# Patient Record
Sex: Male | Born: 1990 | Hispanic: No | Marital: Single | Smoking: Never smoker
Health system: Southern US, Community
[De-identification: ages and names within clinical notes are randomized; demographics above are authoritative.]

## PROBLEM LIST (undated history)

## (undated) ENCOUNTER — Other Ambulatory Visit (HOSPITAL_COMMUNITY): Admission: EM | Source: Home / Self Care

## (undated) DIAGNOSIS — F419 Anxiety disorder, unspecified: Secondary | ICD-10-CM

## (undated) HISTORY — PX: APPENDECTOMY: SHX54

## (undated) HISTORY — DX: Anxiety disorder, unspecified: F41.9

## (undated) NOTE — *Deleted (*Deleted)
Adult Psychoeducational Group Not Date:  11/19/2019 Time:  0900-1045 Group Topic/Focus: PROGRESSIVE RELAXATION. A group where deep breathing is taught and tensing and relaxation muscle groups is used. Imagery is used as well.  Pts are asked to imagine 3 pillars that hold them up when they are not able to hold themselves up.  Participation Level:  Active  Participation Quality:  Appropriate  Affect:  Appropriate  Cognitive:  Oriented  Insight: Improving  Engagement in Group:  Engaged  Modes of Intervention:  Activity, Discussion, Education, and Support  Additional Comments:  ***  Melitta Tigue A 11/19/2019`  

---
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
## 2010-05-20 NOTE — Discharge Summary (Signed)
Thomas [name], Thomas [name]           ACCOUNT NO.:  [id number]   MEDICAL RECORD NO.:  [id number]          PATIENT TYPE:  OBV   LOCATION:  6123                         FACILITY:  [facility]   PHYSICIAN:  Dyann Ruddle, MDDATE OF BIRTH:  June 24, 1990   DATE OF ADMISSION:  09/13/2007  DATE OF DISCHARGE:  09/14/2007                               DISCHARGE SUMMARY   SIGNIFICANT FINDINGS:  Thomas [name] is a 20 year old male with a history of  head trauma approximately 1 week prior to admission.  He presents with  headache and dizziness since that time.  A head CT was obtained in the  emergency department at presentation and demonstrated a tiny hemorrhage  of the gray-white junction in the left frontal lobe.  There was no  associated edema or midline shift that was appreciated at that time.  He  was admitted for observation and close neurological checks.   TREATMENTS:  He was observed overnight with frequent neurologic checks.  His symptoms improved completely, and he had a nonfocal neuro exam.   OPERATIONS AND PROCEDURES:  None.   FINAL DIAGNOSIS:  Cerebral contusion.   DISCHARGE MEDICATIONS AND INSTRUCTIONS:  There are no discharge  medications.  Please avoid sports for the next week and return to the  emergency department or primary care physician for any worsening  symptoms, seizures, syncope, dizziness, persistent nausea or vomiting,  or severe headaches.   There are no pending results or issues to be followed up.   FOLLOWUP:  He will follow up on September 26, 2007 at [facility], the appointment has already been scheduled.   DISCHARGE WEIGHT:  73 kg.   DISCHARGE CONDITION:  Good.   This discharge summary is faxed to his primary care physician at  [facility] at [phone] on September 14, 2007.   REASON FOR HOSPITALIZATION:  Cerebral contusion.      Pediatrics Resident      Dyann Ruddle, MD  Electronically Signed    PR/MEDQ  D:  09/14/2007  T:  09/14/2007  Job:  [id number]

## 2010-05-23 NOTE — Op Note (Signed)
Thomas [name], Thomas [name]                       ACCOUNT NO.:  [id number]   MEDICAL RECORD NO.:  [id number]                   PATIENT TYPE:  EMS   LOCATION:  ED                                   FACILITY:  [facility]   PHYSICIAN:  Leonia Corona, M.D.               DATE OF BIRTH:  09/13/1990   DATE OF PROCEDURE:  01/26/2002  DATE OF DISCHARGE:                                 OPERATIVE REPORT   PREOPERATIVE DIAGNOSIS:  Acute appendicitis.   POSTOPERATIVE DIAGNOSIS:  Acute appendicitis.   OPERATION PERFORMED:  Open appendectomy.   SURGEON:  Leonia Corona, M.D.   ASSISTANT:  Nurse.   ANESTHESIA:  General endotracheal.   INDICATIONS FOR PROCEDURE:  This 20 year old male child was evaluated for  abdominal pain associated with nausea, vomiting and fever at [facility] emergency room.  Clinically, there was minimal  periumbilical tenderness.  The CT scan was highly suspicious for acute  appendicitis, hence the indication for the procedure.   DESCRIPTION OF PROCEDURE:  The patient was brought to the operating room,  placed supine on the operating table.  General endotracheal anesthesia was  given.  The right lower quadrant of the abdominal wall and the surrounding  area was cleaned, prepped and draped in the usual manner.  A McBurney  incision was made, centered at the McBurney point extending laterally on  either side for about 1.5 cm, making a 3 cm long incision around the skin  crease.  The incision was deepened through the subcutaneous tissue using  electrocautery until the external oblique aponeurosis was exposed.  The  external oblique was incised in the line of its fibers with a small nick  made with the knife and then extended with scissors.  The internal oblique  and transversus abdominis muscle fibers were split along its fibers with the  help of a blunt tip hemostat thrust into the muscle and then split.  [employer], water were used to spread the  fibers until the peritoneum was  visualized.  The peritoneum was held up with two hemostats and divided in  between with scissors.  The opening into the peritoneal cavity was extended  on either side with scissors.  A minimal amount of seropurulent discharge  was noted in the peritoneum just beneath the incision indicating an  inflammatory process.  The underlying cecum was followed by a Babcock  forceps.  This led to a very severely swollen distended inflamed appendix  toward the pelvic position which was bulbous and swollen at the tip  extensively covered by the omentum.  It was held up with the right index  finger and then brought out through the incision.  The cecum was mobilized  by blunt dissection with fingers and then partially was brought out through  the incision.  The mesoappendix was divided in between clamps and ligated  using 3-0 silk until the base of the appendix was  cleared.  The base was  crushed with straight clamp and clamped above the base.  The base was  ligated using 2-0 Vicryl and appendix was divided above the base and removed  from the field.  The mucosa of the appendiceal stump was cauterized and this  was buried under a pursestring suture made on the cecum around the stump  using 3-0 silk.  The cecum was returned back into the peritoneal cavity,  warm saline irrigation was given thoroughly until the returning fluid was  clear.  The peritoneum was now closed using 3-0 Vicryl running stitches.  The transverse abdominis and the internal oblique muscles were approximated  2-0 Vicryl sutures.  The external oblique aponeurosis was repaired using 2-0  Vicryl interrupted sutures.  The wound was irrigated once again.  The  subcutaneous stitch using 4-0 silk was placed and the skin was closed with 4-  0 Monocryl subcuticular stitch.  Steri-Strips were applied which was covered  with sterile gauze and Tegaderm dressing.  The patient tolerated the  procedure well which was  smooth and uneventful.  The patient was later  extubated and transferred to the recovery room in good and stable condition.                                                Leonia Corona, M.D.    SF/MEDQ  D:  01/26/2002  T:  01/26/2002  Job:  [id number]   cc:   Catalina Lunger  [address]  [place]  [place] [address]  Fax: [phone]

## 2012-01-28 ENCOUNTER — Ambulatory Visit (INDEPENDENT_AMBULATORY_CARE_PROVIDER_SITE_OTHER): Payer: Self-pay | Admitting: Physician Assistant

## 2012-01-28 VITALS — BP 108/72 | HR 80 | Temp 97.8°F | Resp 18 | Wt 150.0 lb

## 2012-01-28 DIAGNOSIS — F411 Generalized anxiety disorder: Secondary | ICD-10-CM

## 2012-01-28 LAB — POCT CBC
Granulocyte percent: 66.3 %G (ref 37–80)
HCT, POC: 55.1 % — AB (ref 43.5–53.7)
Hemoglobin: 17.7 g/dL (ref 14.1–18.1)
Lymph, poc: 3 (ref 0.6–3.4)
MCH, POC: 30.7 pg (ref 27–31.2)
MCHC: 32.1 g/dL (ref 31.8–35.4)
MCV: 95.7 fL (ref 80–97)
MID (cbc): 0.6 (ref 0–0.9)
MPV: 8.7 fL (ref 0–99.8)
POC Granulocyte: 7.1 — AB (ref 2–6.9)
POC LYMPH PERCENT: 27.7 %L (ref 10–50)
POC MID %: 6 %M (ref 0–12)
Platelet Count, POC: 459 10*3/uL — AB (ref 142–424)
RBC: 5.76 M/uL (ref 4.69–6.13)
RDW, POC: 13 %
WBC: 10.7 10*3/uL — AB (ref 4.6–10.2)

## 2012-01-28 LAB — TSH: TSH: 0.766 u[IU]/mL (ref 0.350–4.500)

## 2012-01-28 MED ORDER — CLONAZEPAM 0.5 MG PO TABS: ORAL_TABLET | ORAL | Status: DC

## 2012-01-28 MED ORDER — CITALOPRAM HYDROBROMIDE 20 MG PO TABS: 20.0000 mg | ORAL_TABLET | Freq: Every day | ORAL | Status: DC

## 2012-01-28 NOTE — Progress Notes (Signed)
Subjective:    Patient ID: Thomas [name], male    DOB: [birth year], 22 y.o.   MRN: [id number]  HPI 22 yr old CM presents here with his mom about anxiety and depression. 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  He denies any major changes in his life. No deaths, no loss of relationship.  He lives at home with his parents.  His symptoms include waking up in the mornings feeling as though his heart is racing, anhedonia, and can't focus.  He is also experiencing problems going to sleep at night. He is unsure what he wants to do as a career and finds this bringing him a lot of anxiety.  He feels like everywhere he looks, people are more advanced in their lives than he is.  He denies SI/HI.  He has a good support system. He has not been to formal counseling and has never had episodes like this in the past. He denies diarrhea, nausea, vomiting, weight loss, hair changes, chest pain, or shortness of breath.  He has obsessive and perseverating thoughts about the future. Mom agrees with the history as given by the patient.    Review of Systems  All other systems reviewed and are negative.       Objective:   Physical Exam  Nursing note and vitals reviewed. Constitutional: He is oriented to person, place, and time. He appears well-developed and well-nourished.  HENT:  Head: Normocephalic and atraumatic.  Cardiovascular: Normal rate.   Pulmonary/Chest: Effort normal.  Neurological: He is alert and oriented to person, place, and time.  Skin: Skin is warm and dry.  Psychiatric: He has a normal mood and affect. His speech is normal and behavior is normal. Judgment and thought content normal. His mood appears not anxious. His affect is not angry, not blunt, not labile and not inappropriate. Cognition and memory are normal. He does not exhibit a depressed mood.       He asks and answers questions appropriately   Results for orders placed in visit on 01/28/12  POCT CBC   Component Value Range   WBC 10.7 (*) 4.6 - 10.2 K/uL   Lymph, poc 3.0  0.6 - 3.4   POC LYMPH PERCENT 27.7  10 - 50 %L   MID (cbc) 0.6  0 - 0.9   POC MID % 6.0  0 - 12 %M   POC Granulocyte 7.1 (*) 2 - 6.9   Granulocyte percent 66.3  37 - 80 %G   RBC 5.76  4.69 - 6.13 M/uL   Hemoglobin 17.7  14.1 - 18.1 g/dL   HCT, POC 08.6 (*) 57.8 - 53.7 %   MCV 95.7  80 - 97 fL   MCH, POC 30.7  27 - 31.2 pg   MCHC 32.1  31.8 - 35.4 g/dL   RDW, POC 46.9     Platelet Count, POC 459 (*) 142 - 424 K/uL   MPV 8.7  0 - 99.8 fL       Assessment & Plan:  Anxiety and depression-he doesn't want to do counseling at this point, but he has good support.  I spent ~40 minutes face to face counseling. He does wish to try medication at this point.  I am checking TSH, CBC, vit D to make sure those are normal.  He is going to start celexa 20mg  (first 4 days, 1/4 tab daily, then 1/2 daily X 1 week, then 1 daily.  He may use the  clonazepam at night to help him sleep. I will call him on February 13th for a progress report.  He will call me if any problems sooner.

## 2012-01-29 LAB — VITAMIN D 25 HYDROXY (VIT D DEFICIENCY, FRACTURES): Vit D, 25-Hydroxy: 25 ng/mL — ABNORMAL LOW (ref 30–89)

## 2013-07-16 ENCOUNTER — Ambulatory Visit (INDEPENDENT_AMBULATORY_CARE_PROVIDER_SITE_OTHER): Payer: Self-pay | Admitting: Emergency Medicine

## 2013-07-16 VITALS — BP 104/78 | HR 66 | Temp 97.6°F | Resp 16 | Ht 72.0 in | Wt 149.0 lb

## 2013-07-16 DIAGNOSIS — G4700 Insomnia, unspecified: Secondary | ICD-10-CM

## 2013-07-16 DIAGNOSIS — F411 Generalized anxiety disorder: Secondary | ICD-10-CM

## 2013-07-16 MED ORDER — LORAZEPAM 1 MG PO TABS: 1.0000 mg | ORAL_TABLET | Freq: Three times a day (TID) | ORAL | Status: DC | PRN

## 2013-07-16 MED ORDER — PAROXETINE HCL 20 MG PO TABS: 20.0000 mg | ORAL_TABLET | Freq: Every day | ORAL | Status: DC

## 2013-07-16 NOTE — Patient Instructions (Signed)
Generalized Anxiety Disorder  Generalized anxiety disorder (GAD) is a mental disorder. It interferes with life functions, including relationships, work, and school.  GAD is different from normal anxiety, which everyone experiences at some point in their lives in response to specific life events and activities. Normal anxiety actually helps us prepare for and get through these life events and activities. Normal anxiety goes away after the event or activity is over.   GAD causes anxiety that is not necessarily related to specific events or activities. It also causes excess anxiety in proportion to specific events or activities. The anxiety associated with GAD is also difficult to control. GAD can vary from mild to severe. People with severe GAD can have intense waves of anxiety with physical symptoms (panic attacks).   SYMPTOMS  The anxiety and worry associated with GAD are difficult to control. This anxiety and worry are related to many life events and activities and also occur more days than not for 6 months or longer. People with GAD also have three or more of the following symptoms (one or more in children):  · Restlessness.    · Fatigue.  · Difficulty concentrating.    · Irritability.  · Muscle tension.  · Difficulty sleeping or unsatisfying sleep.  DIAGNOSIS  GAD is diagnosed through an assessment by your caregiver. Your caregiver will ask you questions about your mood, physical symptoms, and events in your life. Your caregiver may ask you about your medical history and use of alcohol or drugs, including prescription medications. Your caregiver may also do a physical exam and blood tests. Certain medical conditions and the use of certain substances can cause symptoms similar to those associated with GAD. Your caregiver may refer you to a mental health specialist for further evaluation.  TREATMENT  The following therapies are usually used to treat GAD:   · Medication--Antidepressant medication usually is  prescribed for long-term daily control. Antianxiety medications may be added in severe cases, especially when panic attacks occur.    · Talk therapy (psychotherapy)--Certain types of talk therapy can be helpful in treating GAD by providing support, education, and guidance. A form of talk therapy called cognitive behavioral therapy can teach you healthy ways to think about and react to daily life events and activities.  · Stress management techniques--These include yoga, meditation, and exercise and can be very helpful when they are practiced regularly.  A mental health specialist can help determine which treatment is best for you. Some people see improvement with one therapy. However, other people require a combination of therapies.  Document Released: 04/18/2012 Document Reviewed: 04/18/2012  ExitCare® Patient Information ©2015 [employer]. This information is not intended to replace advice given to you by your health care provider. Make sure you discuss any questions you have with your health care provider.

## 2013-07-16 NOTE — Progress Notes (Signed)
[facility] [address], [place] [address] [phone]  Date:  07/16/2013   Name:  Thomas AhoChristian A [name]   DOB:  1990/09/28   MRN:  [id number]  PCP:  No PCP Per Patient    Chief Complaint: Anxiety and Panic Attack   History of Present Illness:  Thomas GhaziChristian A Wayne SeverHerrera is a 23 y.o. very pleasant male patient who presents with the following:  Has a year long history of anxiety and insomnia.  Worried about school and work.  Took otc meds for insomnia.  Wakes up in middle of night with panic attacks.  No history of alcohol or substance abuse.  Arrested Thursday for DUI. Now anxiety is worse and has not eaten in three days.  broght by mother.  Works in [employer] with father.  No suicide or homicidal thoughts.  No improvement with over the counter medications or other home remedies. Denies other complaint or health concern today.   There are no active problems to display for this patient.   Past Medical History  Diagnosis Date  . Anxiety     Past Surgical History  Procedure Laterality Date  . Appendectomy      History  Substance Use Topics  . Smoking status: Never Smoker   . Smokeless tobacco: Not on file  . Alcohol Use: No    No family history on file.  No Known Allergies  Medication list has been reviewed and updated.  Current Outpatient Prescriptions on File Prior to Visit  Medication Sig Dispense Refill  . citalopram (CELEXA) 20 MG tablet Take 1 tablet (20 mg total) by mouth daily.  90 tablet  1  . clonazePAM (KLONOPIN) 0.5 MG tablet 1/2-1 bid as needed for anxiety  20 tablet  0   No current facility-administered medications on file prior to visit.    Review of Systems:  As per HPI, otherwise negative.    Physical Examination: Filed Vitals:   07/16/13 0943  BP: 104/78  Pulse: 66  Temp: 97.6 F (36.4 C)  Resp: 16   Filed Vitals:   07/16/13 0943  Height: 6' (1.829 m)  Weight: 149 lb (67.586 kg)   Body mass index is 20.2  kg/(m^2). Ideal Body Weight: Weight in (lb) to have BMI = 25: 183.9   GEN: WDWN, NAD, Non-toxic, Alert & Oriented x 3 HEENT: Atraumatic, Normocephalic.  Ears and Nose: No external deformity. EXTR: No clubbing/cyanosis/edema NEURO: Normal gait.  PSYCH: Normally interactive. Conversant. Not depressed or anxious appearing.  Calm demeanor.    Assessment and Plan: Anxiety disorder Panic disorder Ativan paxil Follow up in one month  Signed,  Phillips OdorJeffery Adalid Beckmann, MD

## 2019-06-27 ENCOUNTER — Encounter: Payer: Self-pay | Admitting: Emergency Medicine

## 2019-06-27 ENCOUNTER — Other Ambulatory Visit: Payer: Self-pay

## 2019-06-27 ENCOUNTER — Ambulatory Visit
Admission: EM | Admit: 2019-06-27 | Discharge: 2019-06-27 | Disposition: A | Discharge status: Home / Self Care | Payer: Self-pay | Attending: Physician Assistant | Admitting: Physician Assistant

## 2019-06-27 DIAGNOSIS — F411 Generalized anxiety disorder: Secondary | ICD-10-CM

## 2019-06-27 MED ORDER — HYDROXYZINE HCL 25 MG PO TABS: 25.0000 mg | ORAL_TABLET | Freq: Four times a day (QID) | ORAL | 0 refills | Status: DC | PRN

## 2019-06-27 MED ORDER — PAROXETINE HCL 20 MG PO TABS: 20.0000 mg | ORAL_TABLET | Freq: Every day | ORAL | 0 refills | Status: DC

## 2019-06-27 NOTE — Discharge Instructions (Addendum)
Start Paxil as directed.  Start hydroxyzine as needed for anxiety.  Please call behavioral health clinic to make appointment for follow-up.  If having worsening symptoms, thoughts of harming yourself or others, go to the emergency department for further evaluation.

## 2019-06-27 NOTE — ED Provider Notes (Signed)
[facility]    CSN: [id number] Arrival date & time: 06/27/19  0998      History   Chief Complaint Chief Complaint  Patient presents with  . Anxiety    HPI Thomas [name] is a 29 y.o. male.   29 year old male comes in for anxiety.  History of anxiety and panic attacks.  Was seen by provider 6 years ago, started on benzo, Paxil, felt that the medications helped, but never followed up for reevaluation.  Recently graduated from school, and started a new job, causing increased stress and anxiety in life.  States has had decreased appetite, second-guessing decisions, generalized anxiety. Will occasionally feel hopeless.  Does not have primary care follow-up. Denies SI/HI.     Past Medical History:  Diagnosis Date  . Anxiety     There are no problems to display for this patient.   Past Surgical History:  Procedure Laterality Date  . APPENDECTOMY         Home Medications    Prior to Admission medications   Medication Sig Start Date End Date Taking? Authorizing Provider  hydrOXYzine (ATARAX/VISTARIL) 25 MG tablet Take 1 tablet (25 mg total) by mouth every 6 (six) hours as needed. 06/27/19   Cathie Hoops, Surya Folden V, PA-C  PARoxetine (PAXIL) 20 MG tablet Take 1 tablet (20 mg total) by mouth daily. 06/27/19   Belinda Fisher, PA-C    Family History No family history on file.  Social History Social History   Tobacco Use  . Smoking status: Never Smoker  Substance Use Topics  . Alcohol use: Yes  . Drug use: No     Allergies   Patient has no known allergies.   Review of Systems Review of Systems  Reason unable to perform ROS: See HPI as above.     Physical Exam Triage Vital Signs ED Triage Vitals  Enc Vitals Group     BP 06/27/19 1042 138/79     Pulse Rate 06/27/19 1042 87     Resp 06/27/19 1042 18     Temp 06/27/19 1042 98.6 F (37 C)     Temp Source 06/27/19 1042 Oral     SpO2 06/27/19 1042 95 %     Weight --      Height --      Head Circumference --       Peak Flow --      Pain Score 06/27/19 1038 0     Pain Loc --      Pain Edu? --      Excl. in GC? --    No data found.  Updated Vital Signs BP 138/79 (BP Location: Left Arm)   Pulse 87   Temp 98.6 F (37 C) (Oral)   Resp 18   SpO2 95%   Physical Exam Constitutional:      General: He is not in acute distress.    Appearance: Normal appearance. He is well-developed. He is not toxic-appearing or diaphoretic.  HENT:     Head: Normocephalic and atraumatic.  Eyes:     Conjunctiva/sclera: Conjunctivae normal.     Pupils: Pupils are equal, round, and reactive to light.  Pulmonary:     Effort: Pulmonary effort is normal. No respiratory distress.     Comments: Speaking in full sentences without difficulty Musculoskeletal:     Cervical back: Normal range of motion and neck supple.  Skin:    General: Skin is warm and dry.  Neurological:     Mental Status:  He is alert and oriented to person, place, and time.  Psychiatric:     Comments: Patient making good eye contact, normal speech. No overtly anxious. Denies HI/SI      UC Treatments / Results  Labs (all labs ordered are listed, but only abnormal results are displayed) Labs Reviewed - No data to display  EKG   Radiology No results found.  Procedures Procedures (including critical care time)  Medications Ordered in UC Medications - No data to display  Initial Impression / Assessment and Plan / UC Course  I have reviewed the triage vital signs and the nursing notes.  Pertinent labs & imaging results that were available during my care of the patient were reviewed by me and considered in my medical decision making (see chart for details).    Discussed limitations of management at urgent care.  Patient currently denies HI/SI.  Stating would never harm himself.  Will restart Paxil, and hydroxyzine for symptoms.  Resources provided for BH/PCP.  Strict return precautions.  Patient expresses understanding and agrees to  plan.  Final Clinical Impressions(s) / UC Diagnoses   Final diagnoses:  Anxiety state   ED Prescriptions    Medication Sig Dispense Auth. Provider   hydrOXYzine (ATARAX/VISTARIL) 25 MG tablet Take 1 tablet (25 mg total) by mouth every 6 (six) hours as needed. 20 tablet Romey Cohea V, PA-C   PARoxetine (PAXIL) 20 MG tablet Take 1 tablet (20 mg total) by mouth daily. 30 tablet Ok Edwards, PA-C     I have reviewed the PDMP during this encounter.   Ok Edwards, PA-C 06/27/19 1146

## 2019-06-27 NOTE — ED Triage Notes (Addendum)
Patient has had a history of panic attacks.  Patient graduated school and has a new job and has had increasing anxiety.  Has not slept or eaten for 2 days.  Patient is in process of quitting job, second guessing decisions, and does not follow with a physician.  Denies plan to do harm to self.

## 2019-06-27 NOTE — ED Notes (Signed)
Notified Linward Headland, [place] of patient complaint

## 2019-07-24 ENCOUNTER — Other Ambulatory Visit: Payer: Self-pay

## 2019-07-24 ENCOUNTER — Ambulatory Visit
Admission: EM | Admit: 2019-07-24 | Discharge: 2019-07-24 | Disposition: A | Discharge status: Home / Self Care | Payer: Self-pay

## 2019-07-24 NOTE — ED Notes (Signed)
Pt presents to [facility] for assessment of continued anxiety/panic.  Per APP, asked to inform patient of our capabilities at an [facility] for assessment of anxiety and mental health.  Patient states previous prescription did not help.  This RN discussed PCP, mental health resources, and ER.  Patient asking to involve his mother in conversation.  Pulled mother back to room and discussed same information with her.  Repeatedly offered for APP to discuss with them.  States they do not want to stay for a visit.  Patient denies SI at this time.  Offered National Suicide Hotline number for crisis, and also encouraged them to go to the ER if he cannot wait for a scheduled visit with other providers.  Patient and mother verbalized understanding.  Will d/c.  APP aware.

## 2019-08-08 ENCOUNTER — Other Ambulatory Visit: Payer: Self-pay

## 2019-08-08 ENCOUNTER — Ambulatory Visit (HOSPITAL_COMMUNITY)
Admission: EM | Admit: 2019-08-08 | Discharge: 2019-08-08 | Disposition: A | Discharge status: Home / Self Care | Payer: No Payment, Other | Attending: Psychiatry | Admitting: Psychiatry

## 2019-08-08 DIAGNOSIS — F419 Anxiety disorder, unspecified: Secondary | ICD-10-CM | POA: Insufficient documentation

## 2019-08-08 DIAGNOSIS — F411 Generalized anxiety disorder: Secondary | ICD-10-CM

## 2019-08-08 DIAGNOSIS — Z563 Stressful work schedule: Secondary | ICD-10-CM | POA: Insufficient documentation

## 2019-08-08 MED ORDER — GABAPENTIN 100 MG PO CAPS: 100.0000 mg | ORAL_CAPSULE | Freq: Three times a day (TID) | ORAL | 0 refills | Status: DC

## 2019-08-08 MED ORDER — DULOXETINE HCL 20 MG PO CPEP: 20.0000 mg | ORAL_CAPSULE | Freq: Every day | ORAL | 0 refills | Status: DC

## 2019-08-08 NOTE — Discharge Instructions (Addendum)
Call and schedule appoint Take medications as prescribed

## 2019-08-08 NOTE — BH Assessment (Signed)
Comprehensive Clinical Assessment (CCA) Screening, Triage and Referral Note  08/08/2019 Thomas [name] [id number]  Visit Diagnosis:    ICD-10-CM   1. Generalized anxiety disorder  F41.1    Thomas [name] is a 29 year old patient who voluntarily came to [facility] due to increasing anxiety and panic attacks. Pt states he graduated in May 2021 and that, after graduating, he started a new job in his field but which he hated due to the toxic atmosphere of the people he worked with. Pt states that, due to this, he quit his new job and went back to his old job, which he also hated. Pt states he is "constantly shaking." He states he is unable to sleep and unable to eat; he states he averages 2 hours of sleep per night and that he is only falling asleep b/c he's drinking EtOH.   Pt denies SI, a hx of SI, any prior attempts to kill himself, any plans to kill himself, or any prior hospitalizations for mental health concerns. Pt denies HI, NSSIB, access to guns/weapons, and engagement with the legal system. Pt states he has been experiencing AVH, including hearing his dog, who died last year, barking. He states he has been drinking 8-10 12-ounce beers daily and that he last drank 1 hour prior to coming to [name].  Pt's protective factors include consistent housing with his father, the support of his parents, sister, and girlfriend, and a lack of SI and HI.  Pt provided verbal consent for [name], NP, to contact his mother for collateral information.  Pt was oriented x5. His recent and remote memory is intact. Pt was cooperative with clinician throughout the assessment process. Pt's insight, judgement, and impulse control is fair at this time.   Patient Reported Information How did you hear about [place]? Self   Referral name: N/A   Referral phone number: No data recorded Whom do you see for routine medical problems? I don't have a doctor   Practice/Facility Name: No data recorded  Practice/Facility  Phone Number: No data recorded  Name of Contact: No data recorded  Contact Number: No data recorded  Contact Fax Number: No data recorded  Prescriber Name: No data recorded  Prescriber Address (if known): No data recorded What Is the Reason for Your Visit/Call Today? Pt shares he's been experiencing anxiety and panic attacks for the last month which has increased drinking.  How Long Has This Been Causing You Problems? 1 wk - 1 month  Have You Recently Been in Any Inpatient Treatment (Hospital/Detox/Crisis [name]/28-Day Program)? No   Name/Location of Program/Hospital:No data recorded  How Long Were You There? No data recorded  When Were You Discharged? No data recorded Have You Ever Received Services From [facility] Before? No   Who Do You See at [facility]? No data recorded Have You Recently Had Any Thoughts About Hurting Yourself? No   Are You Planning to Commit Suicide/Harm Yourself At This time?  No  Have you Recently Had Thoughts About Hurting Someone Thomas [name]? No   Explanation: No data recorded Have You Used Any Alcohol or Drugs in the Past 24 Hours? Yes   How Long Ago Did You Use Drugs or Alcohol?  1800   What Did You Use and How Much? Pt states he engaged in the use of EtOH prior to coming to [facility].  What Do You Feel Would Help You the Most Today? No data recorded Do You Currently Have a Therapist/Psychiatrist? No   Name of Therapist/Psychiatrist: No data  recorded  Have You Been Recently Discharged From Any Office Practice or Programs? No   Explanation of Discharge From Practice/Program:  No data recorded    CCA Screening Triage Referral Assessment Type of Contact: Face-to-Face   Is this Initial or Reassessment? No data recorded  Date Telepsych consult ordered in [facility]:  No data recorded  Time Telepsych consult ordered in [facility]:  No data recorded Patient Reported Information Reviewed? Yes   Patient Left Without Being Seen? No data recorded  Reason for Not Completing  Assessment: No data recorded Collateral Involvement: Pt provided verbal consent for [name], NP, to speak with his mother for collateral information.  Does Patient Have a Automotive engineer Guardian? No data recorded  Name and Contact of Legal Guardian:  No data recorded If Minor and Not Living with Parent(s), Who has Custody? N/A  Is CPS involved or ever been involved? Never  Is APS involved or ever been involved? Never  Patient Determined To Be At Risk for Harm To Self or Others Based on Review of Patient Reported Information or Presenting Complaint? No   Method: No data recorded  Availability of Means: No data recorded  Intent: No data recorded  Notification Required: No data recorded  Additional Information for Danger to Others Potential:  No data recorded  Additional Comments for Danger to Others Potential:  No data recorded  Are There Guns or Other Weapons in Your Home?  No data recorded   Types of Guns/Weapons: No data recorded   Are These Weapons Safely Secured?                              No data recorded   Who Could Verify You Are Able To Have These Secured:    No data recorded Do You Have any Outstanding Charges, Pending Court Dates, Parole/Probation? No data recorded Contacted To Inform of Risk of Harm To Self or Others: Other: Comment (N/A)  Location of Assessment: [facility]  Does Patient Present under Involuntary Commitment? No   IVC Papers Initial File Date: No data recorded  [place] of Residence: [place]  Patient Currently Receiving the Following Services: Not Receiving Services   Determination of Need: Routine (7 days)   Options For Referral: Medication Management;Outpatient Therapy [name], NP, reviewed pt's chart and information and determined pt can be psych cleared and provided otpt resources. This information was provided to pt and pt expressed an understanding. Pt shared he had no questions and expressed understanding he  can return to [name] at any times if he has concerns.   [name] Dowdy, LMFT

## 2019-08-08 NOTE — ED Provider Notes (Signed)
Behavioral Health Medical Screening Exam  Thomas [name] is a 29 y.o. male.  Patient presents as a walk-in to the [facility].  Patient reports that for the last 1-1/2 months he has been dealing with anxiety and panic attacks.  He reports that in May he had finished school and had gotten a new job but that the job was extremely horrible and caused him to have severe anxiety.  He states that he quit that job and went back to his old job but now his anxiety is just as bad there as well.  He states he has been using alcohol to assist with his anxiety as well as going to sleep.  He states that he has poor sleep reports sleeping only approximately 2 hours a night.  He also reports that his appetite has been decreased.  Patient denies having any suicidal or homicidal ideations and denies any hallucinations.  Reviewed the chart and patient has been to 2-3 different places in the colon system requesting medications for his anxiety, however the patient has not followed up with anyone for outpatient services yet.  Patient reports that him and his mother are working on it.  Chart review showed the patient had been on Paxil in the past and patient reports now that he had a bad reaction to Paxil and that he had a tingling feeling throughout his body.  He denies being on any other medications however per the chart review the patient was on a benzodiazepine and Vistaril.  Discussed different medications with the patient which included Cymbalta and Neurontin to assist with anxiety and sleep.  Patient stated that he would be willing to try these medications and asked that I contact his mother.  Patient's mother, Thomas [name], was contacted at [phone].  She had brought the patient to the [facility] and she stated that she had no other concerns and just wanted to get some assistance with his daily.  She states that they have been looking for a therapist and a psychiatrist evaluation to see but he has no insurance and has been very  difficult.  She is informed of the services that are provided at the [facility] and she request information so that she can contact the office tomorrow to set up an appointment for the patient.  She has no concerns with the patient discharged and going home and medications, Neurontin 100 mg p.o. 3 times daily and Cymbalta 20 mg p.o. daily, have been E prescribed to pharmacy of choice.  Total Time spent with patient: 30 minutes  Psychiatric Specialty Exam  Presentation  General Appearance:Appropriate for Environment;Casual;Fairly Groomed  Eye Contact:Good  Speech:Clear and Coherent;Normal Rate  Speech Volume:Normal  Handedness:Right   Mood and Affect  Mood:Anxious  Affect:Appropriate;Congruent   Thought Process  Thought Processes:Coherent  Descriptions of Associations:Intact  Orientation:Full (Time, Place and Person)  Thought Content:WDL  Hallucinations:None  Ideas of Reference:None  Suicidal Thoughts:No  Homicidal Thoughts:No   Sensorium  Memory:Immediate Good;Recent Good;Remote Good  Judgment:Fair  Insight:Fair   Executive Functions  Concentration:Good  Attention Span:Good  Recall:Good  Fund of Knowledge:Good  Language:Good   Psychomotor Activity  Psychomotor Activity:Normal   Assets  Assets:Communication Skills;Desire for Improvement;Housing;Physical Health;Social Support;Transportation;Vocational/Educational   Sleep  Sleep:Poor  Number of hours: No data recorded  Physical Exam: Physical Exam Vitals and nursing note reviewed.  Constitutional:      Appearance: He is well-developed.  Cardiovascular:     Rate and Rhythm: Normal rate.  Pulmonary:  Effort: Pulmonary effort is normal.  Musculoskeletal:        General: Normal range of motion.  Skin:    General: Skin is warm.  Neurological:     Mental Status: He is alert and oriented to person, place, and time.    Review of Systems  Constitutional:  Negative.   HENT: Negative.   Eyes: Negative.   Respiratory: Negative.   Cardiovascular: Negative.   Gastrointestinal: Negative.   Genitourinary: Negative.   Musculoskeletal: Negative.   Skin: Negative.   Neurological: Negative.   Endo/Heme/Allergies: Negative.   Psychiatric/Behavioral: The patient is nervous/anxious.    Blood pressure (!) 139/92, pulse 92, temperature 98 F (36.7 C), temperature source Tympanic, resp. rate 18, height 6' (1.829 m), weight 170 lb (77.1 kg), SpO2 97 %. Body mass index is 23.06 kg/m.  Musculoskeletal: Strength & Muscle Tone: within normal limits Gait & Station: normal Patient leans: N/A   Recommendations:  Based on my evaluation the patient does not appear to have an emergency medical condition.  [name], FNP 08/08/2019, 7:43 PM

## 2019-08-08 NOTE — ED Notes (Signed)
Pt. Did not bring in any belongings. Belongings left with pt. Mother in the lobby.

## 2019-08-14 ENCOUNTER — Other Ambulatory Visit: Payer: Self-pay

## 2019-08-14 ENCOUNTER — Ambulatory Visit (HOSPITAL_COMMUNITY)
Admission: EM | Admit: 2019-08-14 | Discharge: 2019-08-14 | Disposition: A | Discharge status: Home / Self Care | Payer: No Payment, Other

## 2019-08-14 NOTE — Discharge Instructions (Signed)
The patient was offer OBS overnight but refused. He was provided information on if he became worse and needed to come back.

## 2019-08-14 NOTE — BH Assessment (Signed)
Comprehensive Clinical Assessment (CCA) Screening, Triage and Referral Note  08/14/2019 Thomas [name] [id number]   Patient presenting to ED voluntarily accompanied by mother with complaint of increased anxiety and panic attacks. Patient was assessed on 08/08/19 at [facility]. Patient was discharged and an outpatient appointment was made for [facility] for 8/19/021. Patient reported onset of symptoms 2 months ago in 4 day increments. Patient reported worsening symptoms in the past 3-4 weeks ongoing everyday. Patient reported increased anxiety and panic stating sometimes he couldn't breathe. Patient reported being prescribed psych medications, however he tried them 2 times, "I felt weird, they worked then wore off fast, it freaked me out so I stopped taking them". Patient reported drinking alcohol to relax him. Per medical chart patient reported drinking 8-10 12-ounce beers daily. Patient reported inability to sleep and eat. Patient reported hearing his dog bark, whom died last year. Patient denied prior psych hospitalizations, prior suicide attempts, SI and HI. Patient denied access to guns.   Patient reported graduating in May 2021, graduating, starting a new job in his field but then quitting due to toxic environment. Patient reported due to stress of new job he then went back to old job which he also hated. Patient was cooperative during assessment. Patient reported   Collateral Contact Thomas [name], mother, patient gave permission for mother to be present during assessment. Thomas [name] reported patient was not eating or sleeping. Thomas [name] reported "maybe this is something more than just anxiety or panic". Thomas [name] reported patient trembling a lot and not functioning. Thomas [name] reported patient self-harming of biting his fingernails very low. Mother reported patient was bitten by 2 ticks a few months ago and that she did her research of possible mental illness stemming from tic bites. Thomas [name] shared that she felt safe  bringing patient home.   Disposition Thomas Paddy, NP, recommended overnight observation. Patient declined recommendation. Patient agreed to follow up with [facility] outpatient medication management services, which he already had appointment. Clinician also shared option of coming back to [facility] on fridays during open times and that is 1st come basis.   Visit Diagnosis: No diagnosis found.  Patient Reported Information How did you hear about [place]? Family/Friend   Referral name: Thomas [name], mother   Referral phone number: No data recorded Whom do you see for routine medical problems? I don't have a doctor   Practice/Facility Name: No data recorded  Practice/Facility Phone Number: No data recorded  Name of Contact: No data recorded  Contact Number: No data recorded  Contact Fax Number: No data recorded  Prescriber Name: No data recorded  Prescriber Address (if known): No data recorded What Is the Reason for Your Visit/Call Today? anxiety panic attacks  How Long Has This Been Causing You Problems? 1-6 months  Have You Recently Been in Any Inpatient Treatment (Hospital/Detox/Crisis Center/28-Day Program)? No   Name/Location of Program/Hospital:No data recorded  How Long Were You There? No data recorded  When Were You Discharged? No data recorded Have You Ever Received Services From [facility] Before? No   Who Do You See at [facility]? No data recorded Have You Recently Had Any Thoughts About Hurting Yourself? No   Are You Planning to Commit Suicide/Harm Yourself At This time?  No  Have you Recently Had Thoughts About Hurting Someone Thomas [name]? No   Explanation: No data recorded Have You Used Any Alcohol or Drugs in the Past 24 Hours? Yes ("only to relax me")   How Long Ago Did You Use Drugs  or Alcohol?  1800   What Did You Use and How Much? Prior to coming to [facility].  What Do You Feel Would Help You the Most Today? Assessment Only;Medication  Do You Currently Have a  Therapist/Psychiatrist? No   Name of Therapist/Psychiatrist: No data recorded  Have You Been Recently Discharged From Any Office Practice or Programs? No   Explanation of Discharge From Practice/Program:  No data recorded    CCA Screening Triage Referral Assessment Type of Contact: Face-to-Face   Is this Initial or Reassessment? No data recorded  Date Telepsych consult ordered in [facility]:  No data recorded  Time Telepsych consult ordered in [facility]:  No data recorded Patient Reported Information Reviewed? Yes   Patient Left Without Being Seen? No data recorded  Reason for Not Completing Assessment: No data recorded Collateral Involvement: Thomas [name], mother, patient gave permission for mother to be present during assessment.  Does Patient Have a Automotive engineer Guardian? No data recorded  Name and Contact of Legal Guardian:  No data recorded If Minor and Not Living with Parent(s), Who has Custody? N/A  Is CPS involved or ever been involved? Never  Is APS involved or ever been involved? Never  Patient Determined To Be At Risk for Harm To Self or Others Based on Review of Patient Reported Information or Presenting Complaint? No   Method: No data recorded  Availability of Means: No data recorded  Intent: No data recorded  Notification Required: No data recorded  Additional Information for Danger to Others Potential:  No data recorded  Additional Comments for Danger to Others Potential:  No data recorded  Are There Guns or Other Weapons in Your Home?  No data recorded   Types of Guns/Weapons: No data recorded   Are These Weapons Safely Secured?                              No data recorded   Who Could Verify You Are Able To Have These Secured:    No data recorded Do You Have any Outstanding Charges, Pending Court Dates, Parole/Probation? No data recorded Contacted To Inform of Risk of Harm To Self or Others: Other: Comment (N/A)  Location of Assessment: [facility] Assessment  Services  Does Patient Present under Involuntary Commitment? No   IVC Papers Initial File Date: No data recorded  [place] of Residence: [place]  Patient Currently Receiving the Following Services: Medication Management   Determination of Need: Routine (7 days)   Options For Referral: Medication Management   Burnetta Sabin, [facility]

## 2019-08-14 NOTE — ED Notes (Signed)
Pt cleared for discharge home, verbalized understanding of discharge instructions.  Left [facility] accompanied by family.  A&O x4, gait steady, no distress noted.

## 2019-08-15 NOTE — ED Provider Notes (Signed)
Behavioral Health Medical Screening Exam  Thomas [name] is a 29 y.o. male.  Total Time spent with patient: 20 minutes   Thomas [name] is a 29 y.o. male. The patient presents today as a walk-in with his mom to the [facility]. Patient reports that he was seen last week here for his anxiety and panic attacks, and he needs something to help him. He was prescribed Neurontin 100 mg p.o. three times daily and Cymbalta 20 mg p.o. daily. The patient is back voicing that he took the medications, but he did not like how it made him feel. He stated, "I stopped taking the medications." He voiced he took it once and has not taken it since. The patient voiced that he drinks 14 to 18 cans of beer, making him feel better. Education was provided to the patient and his mom. They will initially have some "uncomfortable feeling" with most psychiatric medications and discussed with the patient if he can tolerate the uncomfortable feeling until he feels better. He expressed that the beer makes him feel better immediately. Why don't the medications not do the same? The patient is refusing to stay in the OBS unit and be assessed in the morning. He said he would keep his outpatient appointment but does not feel the need to be here overnight. The patient is given resources if he is experiencing any psychiatric crisis to return to the crisis center or go to the nearest ED.   Psychiatric Specialty Exam  Presentation  General Appearance:Appropriate for Environment;Casual;Fairly Groomed  Eye Contact:Good  Speech:Clear and Coherent  Speech Volume:Normal  Handedness:Right   Mood and Affect  Mood:Anxious  Affect:Appropriate   Thought Process  Thought Processes:Coherent  Descriptions of Associations:Intact  Orientation:Full (Time, Place and Person)  Thought Content:Tangential  Hallucinations:None  Ideas of Reference:None  Suicidal Thoughts:No  Homicidal Thoughts:No   Sensorium  Memory:Immediate  Good;Remote Good  Judgment:Fair  Insight:Lacking   Executive Functions  Concentration:Good  Attention Span:Good  Recall:Good  Fund of Knowledge:Good  Language:Good   Psychomotor Activity  Psychomotor Activity:Normal   Assets  Assets:Communication Skills;Resilience;Social Support;Vocational/Educational   Sleep  Sleep:Good  Number of hours: No data recorded  Physical Exam: Physical Exam ROS Blood pressure 128/89, pulse (!) 114, temperature 98.2 F (36.8 C), temperature source Oral, resp. rate 20, SpO2 96 %. There is no height or weight on file to calculate BMI.  Musculoskeletal: Strength & Muscle Tone: within normal limits Gait & Station: normal Patient leans: N/A   Recommendations:  Based on my evaluation the patient does not appear to have an emergency medical condition.  Gillermo Murdoch, NP 08/15/2019, 2:28 AM

## 2019-10-21 ENCOUNTER — Ambulatory Visit (HOSPITAL_COMMUNITY)
Admission: AD | Admit: 2019-10-21 | Discharge: 2019-10-21 | Disposition: A | Discharge status: Home / Self Care | Payer: No Payment, Other | Attending: Psychiatry | Admitting: Psychiatry

## 2019-10-21 NOTE — H&P (Signed)
Behavioral Health Medical Screening Exam  Thomas [name] is a 29 y.o. male presents to [facility] behavioral health accompanied by his mother Thomas [name].  Patient endorsing worsening anxiety and depression.  Unable to identify any triggers.  Patient cites " I do not like job." but that's not the reason of struggling with anxiety.  [name] stated that he has been drinking 9-10 beers daily to cope with daily stressors. Reported is last drink was at 10:00am where he stated he had 1 beer. [name] stated " beer is the only thing that keeps me calm for a little while." patient denied any other illicit drug use.   Patient reports panic attacks are frequent which he has experianced shortness of breath, mood irritability, worsening depression and chest palpitations.  States he was started on Cymbalta and gabapentin however states he does not believe that this medication is helping with his anxiety. " I am not taken this medication, it makes my body feel weird,"  Reports he has a follow-up with a psychiatrist November/2021.  States " that is too long to wait" denied previous suicide  Attempts or self injureouse behaviors.  Denies any previous inpatient admissions.   Thomas [name] was offered overnight observation however declined as he states " I have to go to work on Monday and the thought of inpatient admission makes me supper anxious." Thomas [name] (mother) denied any weapons/guns in the home.  She reported she could keep him safe.  Patient was offered follow-up at [facility] walk-in and/or [facility].  Patient declined.  Support encouragement reassurance was provided.  Total Time spent with patient: 15 minutes  Psychiatric Specialty Exam  Presentation  General Appearance:Appropriate for Environment;Casual;Fairly Groomed  Eye Contact:Good  Speech:Clear and Coherent  Speech Volume:Normal  Handedness:Right   Mood and Affect  Mood:Anxious  Affect:Appropriate   Thought Process  Thought Processes:Coherent  Descriptions of  Associations:Intact  Orientation:Full (Time, Place and Person)  Thought Content:Tangential  Hallucinations:None  Ideas of Reference:None  Suicidal Thoughts:No  Homicidal Thoughts:No   Sensorium  Memory:Immediate Good;Remote Good  Judgment:Fair  Insight:Lacking   Executive Functions  Concentration:Good  Attention Span:Good  Recall:Good  Fund of Knowledge:Good  Language:Good   Psychomotor Activity  Psychomotor Activity:Normal   Assets  Assets:Communication Skills;Resilience;Social Support;Vocational/Educational   Sleep  Sleep:Good  Number of hours: No data recorded  Physical Exam: Physical Exam Vitals reviewed.  Skin:    General: Skin is warm.  Psychiatric:        Attention and Perception: Attention normal.        Mood and Affect: Mood normal.        Speech: Speech normal.        Behavior: Behavior is agitated.        Thought Content: Thought content does not include suicidal plan.        Cognition and Memory: Cognition normal.    Review of Systems  Constitutional: Positive for diaphoresis.  Cardiovascular: Positive for palpitations.  Psychiatric/Behavioral: Positive for depression and substance abuse. Negative for suicidal ideas (passive ideations). The patient is nervous/anxious and has insomnia.   All other systems reviewed and are negative.  Blood pressure 132/78, pulse 99, resp. rate 18, SpO2 98 %. There is no height or weight on file to calculate BMI.  Musculoskeletal: Strength & Muscle Tone: within normal limits Gait & Station: normal Patient leans: N/A   Recommendations: Keep follow-up with outpatient provider Additional outpatient resources was provided Based on my evaluation the patient does not appear to have an emergency medical condition.  Thomas [name]  Charma Igo, NP 10/21/2019, 3:47 PM

## 2019-10-28 ENCOUNTER — Other Ambulatory Visit: Payer: Self-pay

## 2019-10-28 ENCOUNTER — Ambulatory Visit (HOSPITAL_COMMUNITY)
Admission: EM | Admit: 2019-10-28 | Discharge: 2019-10-28 | Disposition: A | Discharge status: Home / Self Care | Payer: No Payment, Other | Attending: Psychiatry | Admitting: Psychiatry

## 2019-10-28 DIAGNOSIS — F419 Anxiety disorder, unspecified: Secondary | ICD-10-CM | POA: Diagnosis not present

## 2019-10-28 DIAGNOSIS — G4700 Insomnia, unspecified: Secondary | ICD-10-CM | POA: Insufficient documentation

## 2019-10-28 DIAGNOSIS — F4323 Adjustment disorder with mixed anxiety and depressed mood: Secondary | ICD-10-CM | POA: Diagnosis not present

## 2019-10-28 DIAGNOSIS — Z7289 Other problems related to lifestyle: Secondary | ICD-10-CM | POA: Insufficient documentation

## 2019-10-28 DIAGNOSIS — F1290 Cannabis use, unspecified, uncomplicated: Secondary | ICD-10-CM | POA: Diagnosis not present

## 2019-10-28 NOTE — ED Triage Notes (Signed)
Patient states he is having some SI and panic attacks and anxiety along with increased depression. Patient having working problems and financial problems.

## 2019-10-28 NOTE — ED Provider Notes (Signed)
Behavioral Health Urgent Care Medical Screening Exam  Patient Name: Thomas [name] MRN: [id number] Date of Evaluation: 10/28/19 Chief Complaint:   Diagnosis:  Final diagnoses:  Adjustment disorder with mixed anxiety and depressed mood    History of Present illness: Thomas [name] is a 29 y.o. male.  Patient presents voluntarily to [facility] for walk-in assessment.  Patient states "I had an episode a few years ago like this and they prescribed me benzo, I guess at that time it helped."  Patient reports feeling anxiety times approximately 4 months.  Patient reports his primary stressor is the fact that he graduated from [employer] in May.  Patient reports "I got a good job and I hated it, I didn't enjoy the work so I went back to my old job but it is a low pain, I feel like I have not career and I'm not happy with where I work, I have always been a super ambitious person."  Patient reports excessive nailbiting related to his anxiety.  Patient reports he has chronically for many years displayed nailbiting behaviors but feels he is biting nails more frequently for the last 4 months.  Patient reports he resides in [place] with his mother.  Patient states "I have a bunch of support."  Patient denies access to weapons.  Patient works at a distribution center currently.  Patient endorses alcohol use, most days of the week."  Patient endorses marijuana use but denies substance use aside from marijuana.  Patient reports he is not currently ready to stop use of alcohol and marijuana.  Patient reports he has not used marijuana lately as he feels that it depresses his mood.  Patient assessed by nurse practitioner.  Patient alert and oriented, answers appropriately.  Patient denies suicidal ideations.  Patient denies any history of suicide attempts.  Patient denies homicidal ideations.  Patient denies auditory visual hallucinations.  There is no evidence of delusional  thought content and patient does not appear to be responding to internal stimuli.  Patient denies symptoms of paranoia.  Patient has been assessed approximately 2 months ago here in the behavioral health urgent care center.  At that time patient was prescribed gabapentin and Cymbalta.  Patient reports he believes Cymbalta causes insomnia for him and believes gabapentin is not effective.  Patient reports he would like to be prescribed medication to treat his anxiety and believes that benzodiazepine medications would help.  Explained that patient should follow-up with outpatient provider to discuss updating medications, patient verbalizes understanding.  Patient offered support and encouragement.  Patient gives verbal consent to speak with his mother, [name]. Patients mother agrees with plan for patient to discharge home and return for outpatient follow-up care.    Psychiatric Specialty Exam  Presentation  General Appearance:Appropriate for Environment;Casual  Eye Contact:Good  Speech:Clear and Coherent;Normal Rate  Speech Volume:Normal  Handedness:Right   Mood and Affect  Mood:Depressed  Affect:Appropriate;Congruent   Thought Process  Thought Processes:Coherent  Descriptions of Associations:Intact  Orientation:Full (Time, Place and Person)  Thought Content:WDL;Logical  Hallucinations:None  Ideas of Reference:None  Suicidal Thoughts:No  Homicidal Thoughts:No   Sensorium  Memory:Immediate Good;Recent Good;Remote Good  Judgment:Good  Insight:Good   Executive Functions  Concentration:Good  Attention Span:Good  Recall:Good  Fund of Knowledge:Good  Language:Good   Psychomotor Activity  Psychomotor Activity:Normal   Assets  Assets:Communication Skills;Desire for Improvement;Financial Resources/Insurance;Housing;Leisure Time;Physical Health;Social Support   Sleep  Sleep:Fair  Number of hours: No data recorded  Physical Exam: Physical Exam Vitals  and nursing note reviewed.  Constitutional:      Appearance: He is well-developed.  HENT:     Head: Normocephalic.  Cardiovascular:     Rate and Rhythm: Normal rate.  Pulmonary:     Effort: Pulmonary effort is normal.  Neurological:     Mental Status: He is alert and oriented to person, place, and time.  Psychiatric:        Attention and Perception: Attention and perception normal.        Mood and Affect: Affect normal. Mood is depressed.        Speech: Speech normal.        Behavior: Behavior normal. Behavior is cooperative.        Thought Content: Thought content normal.        Cognition and Memory: Cognition and memory normal.        Judgment: Judgment normal.    Review of Systems  Constitutional: Negative.   HENT: Negative.   Eyes: Negative.   Respiratory: Negative.   Cardiovascular: Negative.   Gastrointestinal: Negative.   Genitourinary: Negative.   Musculoskeletal: Negative.   Skin: Negative.   Neurological: Negative.   Endo/Heme/Allergies: Negative.   Psychiatric/Behavioral: Positive for depression and substance abuse.   Blood pressure 114/61, pulse 79, temperature 97.7 F (36.5 C), temperature source Tympanic, resp. rate (!) 187, SpO2 97 %. There is no height or weight on file to calculate BMI.  Musculoskeletal: Strength & Muscle Tone: within normal limits Gait & Station: normal Patient leans: N/A   [facility] MSE Discharge Disposition for Follow up and Recommendations: Based on my evaluation the patient does not appear to have an emergency medical condition and can be discharged with resources and follow up care in outpatient services for Medication Management and Individual Therapy  Patient verbalizes plan to follow up with [facility].  Patient reviewed with Dr. Nelly Rout.    Patrcia Dolly, FNP 10/28/2019, 2:26 PM

## 2019-10-28 NOTE — BH Assessment (Addendum)
Comprehensive Clinical Assessment (CCA) Screening, Triage and Referral Note  10/28/2019 Thomas [name] [id number]   Patient is a 29 y.o. male with a history of significant anxiety with panic attacks who presents to [facility] voluntarily for assessment. Patient has had several recent visits to [facility] and [facility], each with a similar presentation.  He was evaluated by NP Lewis at [facility] on 10/18 and was offered admission to continuous assessment at [facility], however he declined at that time stating he couldn't miss work.  Patient endorses fleeting SI directly related to feeling he "can't get relief."  He is requesting medication to treat symptoms, and states he has been self-medicating with alcohol since July.  He has been drinking more heavily for the past couple of months, admitting to drinking almost daily.  He reports consuming 24 beers from last night to 9am this morning.  Patient denies HI and AVH.  He mentioned the "overnight" option offered during his 10/16 visit to "be started on something for anxiety."  After speaking with NP Arlana Pouch, patient has since decided he prefers to go home and follow up with outpatient providers with [facility].  He is able to affirm his safety and engaged in safety planning.   Patient gave verbal consent for LPC to speak with his mother, Thomas [name], to obtain collateral.  She shares that this has been more of a chronic condition that started in his late teens.  She states the episodes seem much more severe, as he will often go the 2-3 weeks during episodes with little to no food or drink intake.  He has reported drinking or eating would make him too anxious.  He has at times held alcohol down, however his mother reports he will often vomit while drinking.  Patient's mother has some safety concerns, as patient has been making statements almost daily about not wanting to suffer any longer with his symptoms.  At one point recently, he mentioned buying a gun, however he has no plan  or intent to do so.  There are no weapons in the home.  Also, patient's mother has mentioned patient being evaluated for Bipolar disorder.  She shares symptoms of significant depressive and what appear to be manic episodes, which have been more extreme recently.   Neither the patient or his mother were aware of walk in hours at [facility].  Patient has been trying to wait for his 11/18 appt scheduled with [facility], however he didn't think he could wait any longer to be seen and decided to come in today.   Disposition: Per Berneice Heinrich, NP patient does not meet inpatient criteria.  Outpatient treatment is recommended.  Patient plans to present to [facility] for walk in hours on Monday morning. Walk in days/hours have been included in the AVS to be provide to pt upon d/c.   Visit Diagnosis:    ICD-10-CM   1. Adjustment disorder with mixed anxiety and depressed mood  F43.23     Patient Reported Information How did you hear about [place]? Self   Referral name: Thomas [name], mother   Referral phone number: No data recorded Whom do you see for routine medical problems? I don't have a doctor   Practice/Facility Name: No data recorded  Practice/Facility Phone Number: No data recorded  Name of Contact: No data recorded  Contact Number: No data recorded  Contact Fax Number: No data recorded  Prescriber Name: No data recorded  Prescriber Address (if known): No data recorded What Is the Reason for  Your Visit/Call Today? Patient reports anxiety episodes lasting 3 weeks on and off since May.  He is currently in an episode and reports the only slight relief is from drinking alcohol.  How Long Has This Been Causing You Problems? 1-6 months  Have You Recently Been in Any Inpatient Treatment (Hospital/Detox/Crisis Center/28-Day Program)? No   Name/Location of Program/Hospital:No data recorded  How Long Were You There? No data recorded  When Were You Discharged? No data recorded Have You Ever Received Services From [facility]  Before? Yes   [facility]? [facility] and [facility] visits for assessment  Have You Recently Had Any Thoughts About Hurting Yourself? Yes   Are You Planning to Commit Suicide/Harm Yourself At This time?  No  Have you Recently Had Thoughts About Hurting Someone [name]? No   Explanation: No data recorded Have You Used Any Alcohol or Drugs in the Past 24 Hours? Yes   How Long Ago Did You Use Drugs or Alcohol?  1800   What Did You Use and How Much? Patient reports drinking approx 24 beers through the night and into the morning.  What Do You Feel Would Help You the Most Today? Assessment Only  Do You Currently Have a Therapist/Psychiatrist? No   Name of Therapist/Psychiatrist: No data recorded  Have You Been Recently Discharged From Any Office Practice or Programs? No   Explanation of Discharge From Practice/Program:  No data recorded    CCA Screening Triage Referral Assessment Type of Contact: Face-to-Face   Is this Initial or Reassessment? No data recorded  Date Telepsych consult ordered in [facility]:  No data recorded  Time Telepsych consult ordered in [facility]:  No data recorded Patient Reported Information Reviewed? Yes   Patient Left Without Being Seen? No data recorded  Reason for Not Completing Assessment: No data recorded Collateral Involvement: Jetty Peeks, patient's mother, provided collateral.  Does Patient Have a Court Appointed Legal Guardian? No data recorded  Name and Contact of Legal Guardian:  No data recorded If Minor and Not Living with Parent(s), Who has Custody? N/A  Is CPS involved or ever been involved? Never  Is APS involved or ever been involved? Never  Patient Determined To Be At Risk for Harm To Self or Others Based on Review of Patient Reported Information or Presenting Complaint? No   Method: No data recorded  Availability of Means: No data recorded  Intent: No data recorded  Notification Required: No data recorded  Additional Information for  Danger to Others Potential:  No data recorded  Additional Comments for Danger to Others Potential:  No data recorded  Are There Guns or Other Weapons in Your Home?  No data recorded   Types of Guns/Weapons: No data recorded   Are These Weapons Safely Secured?                              No data recorded   Who Could Verify You Are Able To Have These Secured:    No data recorded Do You Have any Outstanding Charges, Pending Court Dates, Parole/Probation? No data recorded Contacted To Inform of Risk of Harm To Self or Others: Other: Comment (N/A)  Location of Assessment: [facility]  Does Patient Present under Involuntary Commitment? No   IVC Papers Initial File Date: No data recorded  [place] of Residence: [place]  Patient Currently Receiving the Following Services: Not Receiving Services   Determination  of Need: Routine (7 days)   Options For Referral: Medication Management;Outpatient Therapy   Yetta Glassman, [facility]

## 2019-10-28 NOTE — Discharge Instructions (Addendum)
Take all of youe medications as prescribed by your mental healthcare provider.  Report any adverse effects and reactions from your medications to your outpatient provider promptly.  Do not engage in alcohol and or illegal drug use while on prescription medicines. Keep all scheduled appointments. This is to ensure that you are getting refills on time and to avoid any interruption in your medication.  If you are unable to keep an appointment call to reschedule.  Be sure to follow up with resources and follow ups given. In the event of worsening symptoms call the crisis hotline, 911, and or go to the nearest emergency department for appropriate evaluation and treatment of symptoms. Follow-up with your primary care provider for your medical issues, concerns and or health care needs.   Substance abuse resources and Residential Options:  [facility] day residential substance abuse facility (not an option if you have active assault charges). [address], [place], [place] [address] Phone: [phone]: Ask for Drema Pry in admissions to complete intake if interested in pursuing this option.  [facility]: Can get intake scheduled; (not an option if you have active assault charges). [address] [place], [place] [phone]) Call Mon-Fri.  [facility]): (offers outpatient therapy and intensive outpatient substance abuse therapy).  [address], [place], [place] [address] Phone: [phone]  [facility]: Offers FREE recovery skills classes, support groups, 1:1 Peer Support, and Compeer Classes. [address] [place], [place] [address] Phone: [phone] (Call to complete intake).   [facility] [address] [place], [place] [address] Phone: [phone] ext [phone]  The [facility] provides food, shelter and other programs and services to the homeless men of [place] through our [employer]  program.  By offering safe shelter, three meals a day, clean clothing, Biblical counseling, financial planning, vocational training, GED/education and employment assistance, we've helped mend the shattered lives of many homeless men since opening in 1974.  We have approximately 267 beds available, with a max of 312 beds including mats for emergency situations and currently house an average of 270 men a night.  Prospective Client Check-In Information Photo ID Required (State/ Out of State/ [facility]) - if photo ID is not available, clients are required to have a printout of a police/sheriff's criminal history report. Help out with chores around the [facility]. No sex offender of any type (pending, charged, registered and/or any other sex related offenses) will be permitted to check in. Must be willing to abide by all rules, regulations, and policies established by the [employer]. The following will be provided - shelter, food, clothing, and biblical counseling. If you or someone you know is in need of assistance at our [facility] shelter in [place], [place], please call [phone] ext. [phone].

## 2019-10-30 ENCOUNTER — Telehealth: Payer: Self-pay

## 2019-10-30 ENCOUNTER — Other Ambulatory Visit (HOSPITAL_COMMUNITY): Payer: Self-pay | Admitting: Psychiatry

## 2019-10-30 ENCOUNTER — Encounter (HOSPITAL_COMMUNITY): Payer: Self-pay | Admitting: Psychiatry

## 2019-10-30 ENCOUNTER — Ambulatory Visit (INDEPENDENT_AMBULATORY_CARE_PROVIDER_SITE_OTHER): Payer: No Payment, Other | Admitting: Psychiatry

## 2019-10-30 ENCOUNTER — Other Ambulatory Visit: Payer: Self-pay

## 2019-10-30 VITALS — BP 128/83 | HR 97 | Ht 73.0 in | Wt 161.0 lb

## 2019-10-30 DIAGNOSIS — F5105 Insomnia due to other mental disorder: Secondary | ICD-10-CM | POA: Insufficient documentation

## 2019-10-30 DIAGNOSIS — F332 Major depressive disorder, recurrent severe without psychotic features: Secondary | ICD-10-CM | POA: Insufficient documentation

## 2019-10-30 DIAGNOSIS — F411 Generalized anxiety disorder: Secondary | ICD-10-CM | POA: Diagnosis not present

## 2019-10-30 DIAGNOSIS — F409 Phobic anxiety disorder, unspecified: Secondary | ICD-10-CM

## 2019-10-30 MED ORDER — QUETIAPINE FUMARATE 50 MG PO TABS: 50.0000 mg | ORAL_TABLET | Freq: Every day | ORAL | 2 refills | Status: DC

## 2019-10-30 MED ORDER — HYDROXYZINE HCL 10 MG PO TABS: 10.0000 mg | ORAL_TABLET | Freq: Three times a day (TID) | ORAL | 2 refills | Status: DC | PRN

## 2019-10-30 MED ORDER — QUETIAPINE FUMARATE 50 MG PO TABS: 50.0000 mg | ORAL_TABLET | Freq: Two times a day (BID) | ORAL | 2 refills | Status: DC

## 2019-10-30 MED ORDER — TRAZODONE HCL 50 MG PO TABS: 50.0000 mg | ORAL_TABLET | Freq: Every evening | ORAL | 2 refills | Status: DC | PRN

## 2019-10-30 MED ORDER — GABAPENTIN 100 MG PO CAPS: 100.0000 mg | ORAL_CAPSULE | Freq: Three times a day (TID) | ORAL | 0 refills | Status: DC

## 2019-10-30 MED ORDER — SERTRALINE HCL 25 MG PO TABS: 25.0000 mg | ORAL_TABLET | Freq: Every day | ORAL | 2 refills | Status: DC

## 2019-10-30 MED FILL — ?SERTRALINE HCL 25MG TABS: 25 | 30 days supply | Qty: 30 | Fill #0

## 2019-10-30 MED FILL — ?TRAZODONE HCL 50 TABS: 50 | 30 days supply | Qty: 30 | Fill #0

## 2019-10-30 MED FILL — hydrOXYzine HCL 10 MG TABS: 10 | 30 days supply | Qty: 90 | Fill #0

## 2019-10-30 MED FILL — QUETIAPINE FUMARATE 50 MG T: 50 | 30 days supply | Qty: 60 | Fill #0

## 2019-10-30 MED FILL — GABAPENTIN 100 MG CAPSULE: 100 | 30 days supply | Qty: 90 | Fill #0

## 2019-10-30 NOTE — Progress Notes (Signed)
Psychiatric Initial Adult Assessment   Patient Identification: Thomas [name] MRN:  [id number] Date of Evaluation:  10/30/2019 Referral Source: Im having a really hard time. Im having panic attacks and I cant sleep or eat Chief Complaint:   Chief Complaint    WALK-IN     Visit Diagnosis:    ICD-10-CM   1. Generalized anxiety disorder  F41.1 sertraline (ZOLOFT) 25 MG tablet    hydrOXYzine (ATARAX/VISTARIL) 10 MG tablet    gabapentin (NEURONTIN) 100 MG capsule  2. Severe episode of recurrent major depressive disorder, without psychotic features (HCC)  F33.2 Ambulatory referral to Social Work    sertraline (ZOLOFT) 25 MG tablet    QUEtiapine (SEROQUEL) 50 MG tablet    DISCONTINUED: QUEtiapine (SEROQUEL) 50 MG tablet  3. 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  Patient walked into clinic for medication management.  He has a psychiatric history of anxiety and depression.  He is currently managed on Cymbalta 20 mg daily and gabapentin 100 mg 3 times daily.  He informed Clinical research associatewriter that he has not been taking his medications as prescribed and notes that he discontinued Cymbalta because it caused a warm sensation over body.  Today he is well-groomed, anxious, engaged in conversation, and maintains poor eye contact.  He describes his mood as depressed and endorses symptoms such as insomnia (sleeping 1 hour a night), feelings of worthlessness, difficulty concentrating, impaired emory, anxiety, decreased appetite, weight loss, and passive SI.  He notes that at times he wishes he were not here however denies wanting to hurt himself today.  He denies SI/HI/AVH or paranoia.  Patient notes however that when he does not get sleep he sees things that are not there.  He informed  provider that his anxiety is interfering with his activities of daily living.  He describes his anxiety as having an asthma attack every day.  Possibly restless in reports biting on his nails.  He informed Clinical research associatewriter that he quit his job in the past due to his anxiety and reports that he dislikes his new job now. He states that it is difficult for him to work.  He notes to cope with his anxiety he drinks 12-15 beers daily (which he notes he started in July).  He informed provider that this weekend however he drank 20-25 beers.  He notes that he wants to cut down on alcohol however reports that is the only thing that works for his anxiousness.  Provider discussed patient detoxing in the hospital and gave him resources for this.  CAGE assessment was conducted and patient scored a 4.  An audit was also conducted and patient scored a 33.  To assess anxiety a GAD-7 was conducted and patient scored a 21.  A PHQ-9 was also conducted and patient scored a 26.  Patient agreeable to starting Zoloft 25 mg daily to help manage depression and anxiety.  He is also agreeable to starting hydroxyzine 10 mg 3 times daily to help manage anxiety.  Patient will also start Seroquel 50 mg to help manage sleep and mood.  He will take trazodone 25 mg to 50 mg nightly as needed for sleep.   Associated Signs/Symptoms: Depression Symptoms:  depressed mood, insomnia, fatigue, feelings of worthlessness/guilt, difficulty concentrating, impaired memory, recurrent thoughts of death,  suicidal thoughts without plan, anxiety, panic attacks, loss of energy/fatigue, disturbed sleep, weight loss, decreased appetite, (Hypo) Manic Symptoms:  Distractibility, Flight of Ideas, Impulsivity, Anxiety Symptoms:  Excessive Worry, Panic Symptoms, Psychotic Symptoms:  Denies PTSD Symptoms: NA  Past Psychiatric History: Anxiety and depression  Previous Psychotropic Medications: Cymbalta and Gabapentin  Substance Abuse History in the last  12 months:  Yes.    Consequences of Substance Abuse: NA  Past Medical History:  Past Medical History:  Diagnosis Date  . Anxiety     Past Surgical History:  Procedure Laterality Date  . APPENDECTOMY      Family Psychiatric History: Maternal grandfather anxiety and mother anxiety   Family History: History reviewed. No pertinent family history.  Social History:   Social History   Socioeconomic History  . Marital status: Single    Spouse name: Not on file  . Number of children: Not on file  . Years of education: Not on file  . Highest education level: Not on file  Occupational History  . Not on file  Tobacco Use  . Smoking status: Never Smoker  Substance and Sexual Activity  . Alcohol use: Yes  . Drug use: No  . Sexual activity: Yes  Other Topics Concern  . Not on file  Social History Narrative  . Not on file   Social Determinants of Health   Financial Resource Strain:   . Difficulty of Paying Living Expenses: Not on file  Food Insecurity:   . Worried About Programme researcher, broadcasting/film/video in the Last Year: Not on file  . Ran Out of Food in the Last Year: Not on file  Transportation Needs:   . Lack of Transportation (Medical): Not on file  . Lack of Transportation (Non-Medical): Not on file  Physical Activity:   . Days of Exercise per Week: Not on file  . Minutes of Exercise per Session: Not on file  Stress:   . Feeling of Stress : Not on file  Social Connections:   . Frequency of Communication with Friends and Family: Not on file  . Frequency of Social Gatherings with Friends and Family: Not on file  . Attends Religious Services: Not on file  . Active Member of Clubs or Organizations: Not on file  . Attends Banker Meetings: Not on file  . Marital Status: Not on file    Additional Social History: Patient resides in [place] with his father.  He has a girlfriend and no children.  He currently works at [employer].  He endorses drinking 12 beers daily and  occasional marijuana use.  Allergies:  No Known Allergies  Metabolic Disorder Labs: No results found for: HGBA1C, MPG No results found for: PROLACTIN No results found for: CHOL, TRIG, HDL, CHOLHDL, VLDL, LDLCALC Lab Results  Component Value Date   TSH 0.766 01/28/2012    Therapeutic Level Labs: No results found for: LITHIUM No results found for: CBMZ No results found for: VALPROATE  Current Medications: Current Outpatient Medications  Medication Sig Dispense Refill  . gabapentin (NEURONTIN) 100 MG capsule Take 1 capsule (100 mg total) by mouth 3 (three) times daily. 90 capsule 0  . hydrOXYzine (ATARAX/VISTARIL) 10 MG tablet Take 1 tablet (10 mg total) by mouth 3 (three) times daily as needed. 90 tablet 2  . QUEtiapine (SEROQUEL) 50 MG tablet Take 1 tablet (50 mg total) by mouth at bedtime. 30 tablet 2  . sertraline (ZOLOFT) 25 MG tablet Take 1 tablet (25 mg total) by mouth daily.  30 tablet 2  . traZODone (DESYREL) 50 MG tablet Take 1 tablet (50 mg total) by mouth at bedtime as needed for sleep. 30 tablet 2   No current facility-administered medications for this visit.    Musculoskeletal: Strength & Muscle Tone: within normal limits Gait & Station: normal Patient leans: N/A  Psychiatric Specialty Exam: Review of Systems  Blood pressure 128/83, pulse 97, height 6\' 1"  (1.854 m), weight 161 lb (73 kg), SpO2 97 %.Body mass index is 21.24 kg/m.  General Appearance: Well Groomed  Eye Contact:  Good  Speech:  Clear and Coherent and Normal Rate  Volume:  Normal  Mood:  Anxious and Depressed  Affect:  Congruent  Thought Process:  Coherent, Goal Directed and Linear  Orientation:  Full (Time, Place, and Person)  Thought Content:  WDL and Logical  Suicidal Thoughts:  No  Homicidal Thoughts:  No  Memory:  Immediate;   Good Recent;   Good Remote;   Good  Judgement:  Good  Insight:  Good  Psychomotor Activity:  Restlessness  Concentration:  Concentration: Good and Attention  Span: Good  Recall:  Good  Fund of Knowledge:Good  Language: Good  Akathisia:  No  Handed:  Right  AIMS (if indicated):  Not done  Assets:  Communication Skills Desire for Improvement Financial Resources/Insurance Housing Social Support  ADL's:  Intact  Cognition: WNL  Sleep:  Poor   Screenings: AUDIT     Office Visit from 10/30/2019 in [facility]  Alcohol Use Disorder Identification Test Final Score (AUDIT) 33    CAGE-AID     Office Visit from 10/30/2019 in [facility]  CAGE-AID Score 4    GAD-7     Office Visit from 10/30/2019 in [facility]  Total GAD-7 Score 21    PHQ2-9     Office Visit from 10/30/2019 in [facility]  PHQ-2 Total Score 6  PHQ-9 Total Score 26      Assessment and Plan: Patient endorses symptoms of anxiety, depression, and insomnia.  He is agreeable to starting hydroxyzine 10 mg 3 times daily as needed for anxiety.  He will also start the Zoloft 25 mg to help manage depression and anxiety.  We will also start Seroquel 50 mg to help manage sleep and mood.  We will also start trazodone 25-50 mg as needed for sleep.  1. Generalized anxiety disorder  Start- sertraline (ZOLOFT) 25 MG tablet; Take 1 tablet (25 mg total) by mouth daily.  Dispense: 30 tablet; Refill: 2 Start-- hydrOXYzine (ATARAX/VISTARIL) 10 MG tablet; Take 1 tablet (10 mg total) by mouth 3 (three) times daily as needed.  Dispense: 90 tablet; Refill: 2 Continue- gabapentin (NEURONTIN) 100 MG capsule; Take 1 capsule (100 mg total) by mouth 3 (three) times daily.  Dispense: 90 capsule; Refill: 0  2. Severe episode of recurrent major depressive disorder, without psychotic features (HCC)  - Ambulatory referral to Social Work - QUEtiapine (SEROQUEL) 50 MG tablet; Take 1 tablet (50 mg total) by mouth one time daily.  Dispense: 60 tablet; Refill: 2 Start-- sertraline (ZOLOFT) 25 MG  tablet; Take 1 tablet (25 mg total) by mouth daily.  Dispense: 30 tablet; Refill: 2  3. Insomnia due to anxiety and fear  Start- QUEtiapine (SEROQUEL) 50 MG tablet; Take 1 tablet (50 mg total) by mouth one time daily.  Dispense: 60 tablet; Refill: 2 Start-- traZODone (DESYREL) 50 MG tablet; Take 1 tablet (50 mg total)  by mouth at bedtime as needed for sleep.  Dispense: 30 tablet; Refill: 2   Follow up in 1 month Follow up with therapy Shanna Cisco, NP 10/25/202110:32 AM

## 2019-10-30 NOTE — Telephone Encounter (Signed)
PATIENT PICKED UP THE SEROQUEL 50 MG 1 BID DOSAGE BEFORE RECEIVING UPDATED SCRIPT OF 1QHS.

## 2019-10-31 ENCOUNTER — Telehealth (HOSPITAL_COMMUNITY): Payer: Self-pay | Admitting: *Deleted

## 2019-10-31 ENCOUNTER — Encounter (HOSPITAL_COMMUNITY): Payer: Self-pay | Admitting: Psychiatry

## 2019-10-31 ENCOUNTER — Telehealth (HOSPITAL_COMMUNITY): Payer: Self-pay | Admitting: Psychiatry

## 2019-10-31 NOTE — Telephone Encounter (Signed)
Call from grandmother who patient lives with asking if the provider will write him out of work for two weeks while he tries to get him self sober. He is not planning to go into a detox program. He was seen for the first time as a walk in yesterday. Will forward this concern to Toy Cookey NP who saw him yesterday.

## 2019-10-31 NOTE — Telephone Encounter (Signed)
Provider called patient who noted that he wants a letter written about him being on psychiatric medications not regarding him going to a detox program. Provider wrote letter and told patient to pick it up at the front desk.

## 2019-10-31 NOTE — Telephone Encounter (Signed)
Provider contacted patient and informed him to take Seroquel 50 mg once daily. He endorsed understanding and agreed.

## 2019-11-03 ENCOUNTER — Telehealth (HOSPITAL_COMMUNITY): Payer: Self-pay | Admitting: *Deleted

## 2019-11-03 ENCOUNTER — Other Ambulatory Visit (HOSPITAL_COMMUNITY): Payer: Self-pay | Admitting: Psychiatry

## 2019-11-03 DIAGNOSIS — F411 Generalized anxiety disorder: Secondary | ICD-10-CM

## 2019-11-03 DIAGNOSIS — F409 Phobic anxiety disorder, unspecified: Secondary | ICD-10-CM

## 2019-11-03 MED ORDER — HYDROXYZINE HCL 25 MG PO TABS: 25.0000 mg | ORAL_TABLET | Freq: Three times a day (TID) | ORAL | 2 refills | Status: DC | PRN

## 2019-11-03 MED ORDER — TRAZODONE HCL 100 MG PO TABS: 100.0000 mg | ORAL_TABLET | Freq: Every evening | ORAL | 2 refills | Status: DC | PRN

## 2019-11-03 NOTE — Telephone Encounter (Signed)
PATIENT'S MOM CALLED STATED THAT PATIENT SINCE STARTING THE  5 PRESCRIPTIONS STARTED ON  PATIENT'S STATES MED'S ARE MAKING HIM FEEL WORSE & THAT THE MED'S FOR SLEEP ARE WORKING HE'S STILL NOT SLEEPING. PATIENT ASKED FOR CALL BACK  # [phone]

## 2019-11-03 NOTE — Telephone Encounter (Signed)
Patient notes that he is unable to stay asleep on his current medication regimen. He noted that he goes to sleep around 10 pm and  wakes up at 3:00 am. He also informed provider that he is anxious most of the day and having panic attacks. Patient agreeable to increase hydroxyzine 10 mg three times daily to 25 mg three times daily. He is also agreeable to increase Trazodone 50 mg to 100 mg to help mange sleep. Patient encouraged to follow up with therapist to address his anxiety. Provider also discussed good sleep hygiene practices. Patient endorsed understanding and agreed.

## 2019-11-07 ENCOUNTER — Telehealth (HOSPITAL_COMMUNITY): Payer: Self-pay | Admitting: *Deleted

## 2019-11-07 NOTE — Telephone Encounter (Signed)
VM left for writer stating he was trying to get to speak with Ms Doyne Keel NP, he has some important questions to ask her about his meds and a question re his work. He would like her to call him.

## 2019-11-08 ENCOUNTER — Other Ambulatory Visit (HOSPITAL_COMMUNITY): Payer: Self-pay | Admitting: Psychiatry

## 2019-11-08 DIAGNOSIS — F332 Major depressive disorder, recurrent severe without psychotic features: Secondary | ICD-10-CM

## 2019-11-08 DIAGNOSIS — F409 Phobic anxiety disorder, unspecified: Secondary | ICD-10-CM

## 2019-11-08 DIAGNOSIS — F411 Generalized anxiety disorder: Secondary | ICD-10-CM

## 2019-11-08 MED ORDER — GABAPENTIN 100 MG PO CAPS: 100.0000 mg | ORAL_CAPSULE | Freq: Three times a day (TID) | ORAL | 2 refills | Status: DC

## 2019-11-08 MED ORDER — SERTRALINE HCL 50 MG PO TABS: 50.0000 mg | ORAL_TABLET | Freq: Every day | ORAL | 2 refills | Status: DC

## 2019-11-08 MED ORDER — TRAZODONE HCL 100 MG PO TABS: 100.0000 mg | ORAL_TABLET | Freq: Every evening | ORAL | 2 refills | Status: DC | PRN

## 2019-11-08 MED ORDER — HYDROXYZINE HCL 25 MG PO TABS: 25.0000 mg | ORAL_TABLET | Freq: Three times a day (TID) | ORAL | 2 refills | Status: DC | PRN

## 2019-11-08 MED ORDER — QUETIAPINE FUMARATE 50 MG PO TABS: 50.0000 mg | ORAL_TABLET | Freq: Every day | ORAL | 2 refills | Status: DC

## 2019-11-08 MED FILL — ?SERTRALINE HCL 25MG TABS: 25 | 30 days supply | Qty: 60 | Fill #0

## 2019-11-08 MED FILL — ?TRAZODONE HCL 50 TABS: 50 | 30 days supply | Qty: 60 | Fill #0

## 2019-11-08 NOTE — Telephone Encounter (Signed)
Patient informed provider that his medications were not available when he attempted to pick them up form [facility]. He also informed provider that he is having increased anxiety and depression. He notes that he is coping with his anxiety and depression by drinking 21 beers a day. Writer informed patient that alcohol can exacerbate his anxiety and depression. Writer also recommended that patient detox in the hospital. He noted that he prefers to try to do detoxing on his own. He also informed provider that he would like to take more time off from work as he is unhappy with his position and unable to function currently. Writer informed patient that if his job is okay with him taking more time off that provider would fill out FMLA paperwork. No other concerns noted at this time.

## 2019-11-09 ENCOUNTER — Telehealth (HOSPITAL_COMMUNITY): Payer: Self-pay | Admitting: *Deleted

## 2019-11-09 NOTE — Telephone Encounter (Signed)
VM left for Retail buyer called him back. He wants to speak with Toy Cookey NP re him taking 12-15 weeks off work and his medicine is making him drink more. Will pass his info on the NP and she will try to call him tomorrow.

## 2019-11-10 ENCOUNTER — Telehealth (HOSPITAL_COMMUNITY): Payer: Self-pay | Admitting: Psychiatry

## 2019-11-10 NOTE — Telephone Encounter (Signed)
Patient informed Clinical research associate that he does not qualify for FMLA. He noted that his job would like a letter from provider to write him out  of work. He requested that writer to write him a letter noting that he needs more time to adjust to the medications. He also informed Clinical research associate that he continues to drink over twenty alcoholic beverages a day. Writer informed patient that his alcohol use can exacerbate his anxiety, depression, and sleep patterns. Writer also informed patient that although medications are important in managing his mental health condition treatment for his increased alcohol use was just as important. Provider informed patient that at this time she would not write a letter extending his work leave. Writer recommended that patient seek treatment at [facility] in [place]. Writer gave patient [facility]'s number and address and noted that they could assist him with detox. He endorsed understanding and asked if he they would write him out of work. Provider informed patient that she was unsure if they would but encouraged him again to seek treatment for his alcohol use. No other concerns noted at this time.

## 2019-11-13 ENCOUNTER — Telehealth (HOSPITAL_COMMUNITY): Payer: Self-pay | Admitting: Psychiatry

## 2019-11-13 ENCOUNTER — Telehealth (HOSPITAL_COMMUNITY): Payer: Self-pay | Admitting: *Deleted

## 2019-11-13 NOTE — Telephone Encounter (Signed)
Provider will not extend patients leave at this time. Patient was made aware of this on 11/5. Per last call: Patient informed writer that he does not qualify for FMLA. He noted that his job would like a letter from provider to write him out  of work. He requested that writer to write him a letter noting that he needs more time to adjust to the medications. He also informed Clinical research associate that he continues to drink over twenty alcoholic beverages a day. Writer informed patient that his alcohol use can exacerbate his anxiety, depression, and sleep patterns. Writer also informed patient that although medications are important in managing his mental health condition treatment for his increased alcohol use was just as important. Provider informed patient that at this time she would not write a letter extending his work leave. Writer recommended that patient seek treatment at [facility] in [place]. Writer gave patient [facility]'s number and address and noted that they could assist him with detox. He endorsed understanding and asked if he they would write him out of work. Provider informed patient that she was unsure if they would but encouraged him again to seek treatment for his alcohol use. No other concerns noted at this time.

## 2019-11-13 NOTE — Telephone Encounter (Signed)
Writer called patient and was informed that he would like provider to write him out of work for another week so that he can let the medications work.  He also informed provider that he was unaware of how many alcoholic beverages he had today.  He stated "I had a lot I cannot remember how much I drink".  He noted that he is trying to cut back on alcohol by himself.  Provider informed patient that she would not write a letter for him to be out of work however encourage patient to follow-up with [facility] in [place] for detox and substance abuse treatment.  He endorsed understanding and noted that he may seek treatment.  No other concerns noted at this time.

## 2019-11-13 NOTE — Telephone Encounter (Signed)
Criston called Clinical research associate back to clarify his earlier call re a letter to return to work. Speaking with him now he said he would like a letter to stay out of work two more weeks until the medicine works. He continues to drink ETOH but states he is trying to cut down. Did not specify how much he is drinking. He did not follow thru with Ms Doyne Keel NP recommendation to go to detox, he continues to say he wants to do it himself.  He did not have a specific complaint with any particular medicine. Will bring his concern to Brittneys attention.

## 2019-11-13 NOTE — Telephone Encounter (Signed)
Spoke with patient to let him know his provider would not write him out of work any longer. States he does need a letter to return. Will ask Brittney for such a letter and leave it at the front desk for him to pick it up. He expressed his understanding.

## 2019-11-14 ENCOUNTER — Encounter (HOSPITAL_COMMUNITY): Payer: Self-pay | Admitting: Psychiatry

## 2019-11-16 ENCOUNTER — Emergency Department (HOSPITAL_COMMUNITY)
Admission: EM | Admit: 2019-11-16 | Discharge: 2019-11-17 | Disposition: A | Discharge status: Home / Self Care | Payer: Self-pay | Attending: Emergency Medicine | Admitting: Emergency Medicine

## 2019-11-16 DIAGNOSIS — F419 Anxiety disorder, unspecified: Secondary | ICD-10-CM | POA: Insufficient documentation

## 2019-11-16 DIAGNOSIS — Z79899 Other long term (current) drug therapy: Secondary | ICD-10-CM | POA: Insufficient documentation

## 2019-11-16 DIAGNOSIS — F32A Depression, unspecified: Secondary | ICD-10-CM

## 2019-11-16 DIAGNOSIS — F322 Major depressive disorder, single episode, severe without psychotic features: Secondary | ICD-10-CM | POA: Insufficient documentation

## 2019-11-16 DIAGNOSIS — R45851 Suicidal ideations: Secondary | ICD-10-CM | POA: Insufficient documentation

## 2019-11-16 DIAGNOSIS — Z20822 Contact with and (suspected) exposure to covid-19: Secondary | ICD-10-CM | POA: Insufficient documentation

## 2019-11-16 DIAGNOSIS — F410 Panic disorder [episodic paroxysmal anxiety] without agoraphobia: Secondary | ICD-10-CM | POA: Insufficient documentation

## 2019-11-16 DIAGNOSIS — R000 Tachycardia, unspecified: Secondary | ICD-10-CM | POA: Insufficient documentation

## 2019-11-16 DIAGNOSIS — F1020 Alcohol dependence, uncomplicated: Secondary | ICD-10-CM | POA: Insufficient documentation

## 2019-11-16 LAB — CBC WITH DIFFERENTIAL/PLATELET
Abs Immature Granulocytes: 0.02 10*3/uL (ref 0.00–0.07)
Basophils Absolute: 0.2 10*3/uL — ABNORMAL HIGH (ref 0.0–0.1)
Basophils Relative: 2 %
Eosinophils Absolute: 0.1 10*3/uL (ref 0.0–0.5)
Eosinophils Relative: 2 %
HCT: 46.3 % (ref 39.0–52.0)
Hemoglobin: 16.5 g/dL (ref 13.0–17.0)
Immature Granulocytes: 0 %
Lymphocytes Relative: 12 %
Lymphs Abs: 1.1 10*3/uL (ref 0.7–4.0)
MCH: 32.5 pg (ref 26.0–34.0)
MCHC: 35.6 g/dL (ref 30.0–36.0)
MCV: 91.3 fL (ref 80.0–100.0)
Monocytes Absolute: 0.8 10*3/uL (ref 0.1–1.0)
Monocytes Relative: 8 %
Neutro Abs: 7.1 10*3/uL (ref 1.7–7.7)
Neutrophils Relative %: 76 %
Platelets: 260 10*3/uL (ref 150–400)
RBC: 5.07 MIL/uL (ref 4.22–5.81)
RDW: 13.8 % (ref 11.5–15.5)
WBC: 9.3 10*3/uL (ref 4.0–10.5)
nRBC: 0 % (ref 0.0–0.2)

## 2019-11-16 LAB — CBG MONITORING, ED: Glucose-Capillary: 119 mg/dL — ABNORMAL HIGH (ref 70–99)

## 2019-11-16 MED ORDER — MELATONIN 3 MG PO TABS
3.0000 mg | ORAL_TABLET | Freq: Every day | ORAL | Status: DC
  Administered 2019-11-17: 3 mg via ORAL
  Filled 2019-11-16: qty 1

## 2019-11-16 MED ORDER — LORAZEPAM 1 MG PO TABS
1.0000 mg | ORAL_TABLET | Freq: Once | ORAL | Status: AC
  Administered 2019-11-16: 1 mg via ORAL
  Filled 2019-11-16: qty 1

## 2019-11-16 NOTE — ED Provider Notes (Signed)
[facility]-EMERGENCY DEPT Provider Note   CSN: [id number] Arrival date & time: 11/16/19  2208     History Chief Complaint  Patient presents with  . IVC    Thomas [name] is a 29 y.o. male.  Patient presents to the emergency department with chief complaint of anxiety.  He is under IVC.  Per IVC papers, patient has been drinking excessive amounts of alcohol daily.  Last drink was just prior to arrival.  He reportedly has stated that he has no desire to live.  IVC papers state that he has wanted to purchase a gun.  Patient denies any SI, HI, or hallucinations.  He denies any pain.  He denies any other recent illnesses.  He states that he does feel anxious.  The history is provided by the patient. No language interpreter was used.       Past Medical History:  Diagnosis Date  . Anxiety     Patient Active Problem List   Diagnosis Date Noted  . Generalized anxiety disorder 10/30/2019  . Severe episode of recurrent major depressive disorder, without psychotic features (HCC) 10/30/2019  . Insomnia due to anxiety and fear 10/30/2019    Past Surgical History:  Procedure Laterality Date  . APPENDECTOMY         No family history on file.  Social History   Tobacco Use  . Smoking status: Never Smoker  Substance Use Topics  . Alcohol use: Yes  . Drug use: No    Home Medications Prior to Admission medications   Medication Sig Start Date End Date Taking? Authorizing Provider  gabapentin (NEURONTIN) 100 MG capsule Take 1 capsule (100 mg total) by mouth 3 (three) times daily. 11/08/19  Yes Toy Cookey E, NP  hydrOXYzine (ATARAX/VISTARIL) 25 MG tablet Take 1 tablet (25 mg total) by mouth 3 (three) times daily as needed. 11/08/19  Yes Toy Cookey E, NP  QUEtiapine (SEROQUEL) 50 MG tablet Take 1 tablet (50 mg total) by mouth at bedtime. 11/08/19  Yes Toy Cookey E, NP  sertraline (ZOLOFT) 50 MG tablet Take 1 tablet (50 mg total) by mouth  daily. 11/08/19  Yes Toy Cookey E, NP  traZODone (DESYREL) 100 MG tablet Take 1 tablet (100 mg total) by mouth at bedtime as needed for sleep. 11/08/19  Yes Shanna Cisco, NP    Allergies    Patient has no known allergies.  Review of Systems   Review of Systems  All other systems reviewed and are negative.   Physical Exam Updated Vital Signs BP (!) 147/84 (BP Location: Left Arm)   Pulse (!) 125   Temp 98.7 F (37.1 C) (Oral)   Resp (!) 27   SpO2 95%   Physical Exam Vitals and nursing note reviewed.  Constitutional:      Appearance: He is well-developed.  HENT:     Head: Normocephalic and atraumatic.  Eyes:     Conjunctiva/sclera: Conjunctivae normal.  Cardiovascular:     Rate and Rhythm: Regular rhythm. Tachycardia present.     Heart sounds: No murmur heard.   Pulmonary:     Effort: Pulmonary effort is normal. No respiratory distress.     Breath sounds: Normal breath sounds.  Abdominal:     Palpations: Abdomen is soft.     Tenderness: There is no abdominal tenderness.  Musculoskeletal:        General: Normal range of motion.     Cervical back: Neck supple.  Skin:    General: Skin  is warm and dry.  Neurological:     Mental Status: He is alert and oriented to person, place, and time.  Psychiatric:        Mood and Affect: Mood normal.        Behavior: Behavior normal.     ED Results / Procedures / Treatments   Labs (all labs ordered are listed, but only abnormal results are displayed) Labs Reviewed  RESPIRATORY PANEL BY RT PCR (FLU A&B, COVID)    EKG None  Radiology No results found.  Procedures Procedures (including critical care time)  Medications Ordered in ED Medications  LORazepam (ATIVAN) tablet 1 mg (has no administration in time range)    ED Course  I have reviewed the triage vital signs and the nursing notes.  Pertinent labs & imaging results that were available during my care of the patient were reviewed by me and  considered in my medical decision making (see chart for details).    MDM Rules/Calculators/A&P                          Patient with hx of anxiety.  Hx of alcohol abuse.  Under IVC.  Per IVC papers he has no desire to live.  He has also expressed interest in purchasing a gun.    He denies SI/HI to me.  He does state that he has hx of anxiety and drinks a lot.  He states that he is on some "new medication that seems to be working."  Per TTS, the patient has been accepted for inpatient treatment.  May be transferred after 8 AM.  Final Clinical Impression(s) / ED Diagnoses Final diagnoses:  Depression, unspecified depression type  Suicidal thoughts    Rx / DC Orders ED Discharge Orders    None       Roxy Horseman, PA-C 11/17/19 0109    Vanetta Mulders, MD 12/12/19 [phone]

## 2019-11-16 NOTE — ED Triage Notes (Signed)
Pt brought in by [facility]. Mother stated patient was been drinking 24 cans of beer everyday. Pt been depressed and with suicidal thoughts. Mother stated pt was been compliant on his psych meds but not helping the patient.

## 2019-11-16 NOTE — ED Notes (Signed)
Pt belongings including shoes, shirt, pants, and soda collected and placed in labeled belongings bag. Removed from room and located at ED rms 1-8 nursing station. [employer]

## 2019-11-17 ENCOUNTER — Encounter (HOSPITAL_COMMUNITY): Payer: Self-pay | Admitting: Physician Assistant

## 2019-11-17 ENCOUNTER — Inpatient Hospital Stay (HOSPITAL_COMMUNITY)
Admission: AD | Admit: 2019-11-17 | Discharge: 2019-11-21 | DRG: 885 | Disposition: A | Discharge status: Home / Self Care | Payer: Federal, State, Local not specified - Other | Source: Intra-hospital | Attending: Psychiatry | Admitting: Psychiatry

## 2019-11-17 ENCOUNTER — Other Ambulatory Visit: Payer: Self-pay

## 2019-11-17 DIAGNOSIS — F333 Major depressive disorder, recurrent, severe with psychotic symptoms: Secondary | ICD-10-CM | POA: Diagnosis not present

## 2019-11-17 DIAGNOSIS — Z20822 Contact with and (suspected) exposure to covid-19: Secondary | ICD-10-CM | POA: Diagnosis present

## 2019-11-17 DIAGNOSIS — F5105 Insomnia due to other mental disorder: Secondary | ICD-10-CM | POA: Diagnosis present

## 2019-11-17 DIAGNOSIS — F10239 Alcohol dependence with withdrawal, unspecified: Secondary | ICD-10-CM | POA: Diagnosis present

## 2019-11-17 DIAGNOSIS — F409 Phobic anxiety disorder, unspecified: Secondary | ICD-10-CM

## 2019-11-17 DIAGNOSIS — F332 Major depressive disorder, recurrent severe without psychotic features: Secondary | ICD-10-CM | POA: Diagnosis present

## 2019-11-17 DIAGNOSIS — R45851 Suicidal ideations: Secondary | ICD-10-CM | POA: Diagnosis present

## 2019-11-17 DIAGNOSIS — Y906 Blood alcohol level of 120-199 mg/100 ml: Secondary | ICD-10-CM | POA: Diagnosis present

## 2019-11-17 DIAGNOSIS — F411 Generalized anxiety disorder: Secondary | ICD-10-CM | POA: Diagnosis present

## 2019-11-17 DIAGNOSIS — F410 Panic disorder [episodic paroxysmal anxiety] without agoraphobia: Secondary | ICD-10-CM | POA: Diagnosis present

## 2019-11-17 LAB — CBC
HCT: 45.8 % (ref 39.0–52.0)
Hemoglobin: 16.2 g/dL (ref 13.0–17.0)
MCH: 32.1 pg (ref 26.0–34.0)
MCHC: 35.4 g/dL (ref 30.0–36.0)
MCV: 90.9 fL (ref 80.0–100.0)
Platelets: 247 10*3/uL (ref 150–400)
RBC: 5.04 MIL/uL (ref 4.22–5.81)
RDW: 13.6 % (ref 11.5–15.5)
WBC: 7.2 10*3/uL (ref 4.0–10.5)
nRBC: 0 % (ref 0.0–0.2)

## 2019-11-17 LAB — COMPREHENSIVE METABOLIC PANEL
ALT: 206 U/L — ABNORMAL HIGH (ref 0–44)
ALT: 247 U/L — ABNORMAL HIGH (ref 0–44)
AST: 160 U/L — ABNORMAL HIGH (ref 15–41)
AST: 184 U/L — ABNORMAL HIGH (ref 15–41)
Albumin: 4.8 g/dL (ref 3.5–5.0)
Albumin: 4.9 g/dL (ref 3.5–5.0)
Alkaline Phosphatase: 88 U/L (ref 38–126)
Alkaline Phosphatase: 83 U/L (ref 38–126)
Anion gap: 23 — ABNORMAL HIGH (ref 5–15)
Anion gap: 15 (ref 5–15)
BUN: 8 mg/dL (ref 6–20)
BUN: 13 mg/dL (ref 6–20)
CO2: 21 mmol/L — ABNORMAL LOW (ref 22–32)
CO2: 26 mmol/L (ref 22–32)
Calcium: 9.3 mg/dL (ref 8.9–10.3)
Calcium: 9.8 mg/dL (ref 8.9–10.3)
Chloride: 93 mmol/L — ABNORMAL LOW (ref 98–111)
Chloride: 89 mmol/L — ABNORMAL LOW (ref 98–111)
Creatinine, Ser: 0.81 mg/dL (ref 0.61–1.24)
Creatinine, Ser: 0.89 mg/dL (ref 0.61–1.24)
GFR, Estimated: >60 mL/min (ref >60)
GFR, Estimated: >60 mL/min (ref >60)
Glucose, Bld: 108 mg/dL — ABNORMAL HIGH (ref 70–99)
Glucose, Bld: 166 mg/dL — ABNORMAL HIGH (ref 70–99)
Potassium: 3.7 mmol/L (ref 3.5–5.1)
Potassium: 3.4 mmol/L — ABNORMAL LOW (ref 3.5–5.1)
Sodium: 137 mmol/L (ref 135–145)
Sodium: 130 mmol/L — ABNORMAL LOW (ref 135–145)
Total Bilirubin: 0.8 mg/dL (ref 0.3–1.2)
Total Bilirubin: 1.4 mg/dL — ABNORMAL HIGH (ref 0.3–1.2)
Total Protein: 9 g/dL — ABNORMAL HIGH (ref 6.5–8.1)
Total Protein: 8.6 g/dL — ABNORMAL HIGH (ref 6.5–8.1)

## 2019-11-17 LAB — RAPID URINE DRUG SCREEN, HOSP PERFORMED
Amphetamines: NOT DETECTED
Barbiturates: NOT DETECTED
Benzodiazepines: NOT DETECTED
Cocaine: NOT DETECTED
Opiates: NOT DETECTED
Tetrahydrocannabinol: NOT DETECTED

## 2019-11-17 LAB — ACETAMINOPHEN LEVEL: Acetaminophen (Tylenol), Serum: <10 ug/mL — ABNORMAL LOW (ref 10–30)

## 2019-11-17 LAB — SALICYLATE LEVEL: Salicylate Lvl: <7 mg/dL — ABNORMAL LOW (ref 7.0–30.0)

## 2019-11-17 LAB — RESPIRATORY PANEL BY RT PCR (FLU A&B, COVID)
Influenza A by PCR: NEGATIVE
Influenza B by PCR: NEGATIVE
SARS Coronavirus 2 by RT PCR: NEGATIVE

## 2019-11-17 LAB — MAGNESIUM: Magnesium: 2.2 mg/dL (ref 1.7–2.4)

## 2019-11-17 LAB — PHOSPHORUS: Phosphorus: 2.7 mg/dL (ref 2.5–4.6)

## 2019-11-17 LAB — ETHANOL: Alcohol, Ethyl (B): 190 mg/dL — ABNORMAL HIGH (ref <10)

## 2019-11-17 MED ORDER — MAGNESIUM HYDROXIDE 400 MG/5ML PO SUSP: 30.0000 mL | Freq: Every day | ORAL | Status: DC | PRN

## 2019-11-17 MED ORDER — LORAZEPAM 2 MG/ML IJ SOLN
0.0000 mg | Freq: Four times a day (QID) | INTRAMUSCULAR | Status: DC
  Administered 2019-11-17: 2 mg via INTRAVENOUS
  Filled 2019-11-17: qty 1

## 2019-11-17 MED ORDER — TRAZODONE HCL 100 MG PO TABS
100.0000 mg | ORAL_TABLET | Freq: Every evening | ORAL | Status: DC | PRN
  Administered 2019-11-17 – 2019-11-18 (×2): 100 mg via ORAL
  Filled 2019-11-17 (×2): qty 1
  Filled 2019-11-17: qty 7

## 2019-11-17 MED ORDER — THIAMINE HCL 100 MG PO TABS
100.0000 mg | ORAL_TABLET | Freq: Every day | ORAL | Status: DC
  Administered 2019-11-17 – 2019-11-21 (×5): 100 mg via ORAL
  Filled 2019-11-17 (×6): qty 1

## 2019-11-17 MED ORDER — TRAZODONE HCL 50 MG PO TABS: 50.0000 mg | ORAL_TABLET | Freq: Every evening | ORAL | Status: DC | PRN

## 2019-11-17 MED ORDER — QUETIAPINE FUMARATE 100 MG PO TABS
100.0000 mg | ORAL_TABLET | Freq: Every day | ORAL | Status: DC
  Administered 2019-11-17 – 2019-11-20 (×4): 100 mg via ORAL
  Filled 2019-11-17 (×5): qty 1
  Filled 2019-11-17: qty 7
  Filled 2019-11-17: qty 1

## 2019-11-17 MED ORDER — LORAZEPAM 2 MG/ML IJ SOLN: 1.0000 mg | INTRAMUSCULAR | Status: DC | PRN

## 2019-11-17 MED ORDER — LORAZEPAM 1 MG PO TABS: 0.0000 mg | ORAL_TABLET | Freq: Four times a day (QID) | ORAL | Status: DC

## 2019-11-17 MED ORDER — THIAMINE HCL 100 MG/ML IJ SOLN: 100.0000 mg | Freq: Every day | INTRAMUSCULAR | Status: DC

## 2019-11-17 MED ORDER — LORAZEPAM 2 MG/ML IJ SOLN: 0.0000 mg | Freq: Two times a day (BID) | INTRAMUSCULAR | Status: DC

## 2019-11-17 MED ORDER — SERTRALINE HCL 100 MG PO TABS
100.0000 mg | ORAL_TABLET | Freq: Every day | ORAL | Status: DC
  Administered 2019-11-17 – 2019-11-21 (×5): 100 mg via ORAL
  Filled 2019-11-17 (×3): qty 1
  Filled 2019-11-17: qty 7
  Filled 2019-11-17 (×3): qty 1

## 2019-11-17 MED ORDER — THIAMINE HCL 100 MG PO TABS
100.0000 mg | ORAL_TABLET | Freq: Every day | ORAL | Status: DC
  Administered 2019-11-17: 100 mg via ORAL
  Filled 2019-11-17: qty 1

## 2019-11-17 MED ORDER — HYDROXYZINE HCL 25 MG PO TABS
25.0000 mg | ORAL_TABLET | Freq: Three times a day (TID) | ORAL | Status: DC | PRN
  Administered 2019-11-17 – 2019-11-18 (×2): 25 mg via ORAL
  Filled 2019-11-17: qty 1
  Filled 2019-11-17: qty 10
  Filled 2019-11-17: qty 1

## 2019-11-17 MED ORDER — LORAZEPAM 1 MG PO TABS: 0.0000 mg | ORAL_TABLET | Freq: Two times a day (BID) | ORAL | Status: DC

## 2019-11-17 MED ORDER — ACETAMINOPHEN 325 MG PO TABS: 650.0000 mg | ORAL_TABLET | Freq: Four times a day (QID) | ORAL | Status: DC | PRN

## 2019-11-17 MED ORDER — ADULT MULTIVITAMIN W/MINERALS CH
1.0000 | ORAL_TABLET | Freq: Every day | ORAL | Status: DC
  Administered 2019-11-17 – 2019-11-21 (×5): 1 via ORAL
  Filled 2019-11-17 (×6): qty 1

## 2019-11-17 MED ORDER — CLONAZEPAM 1 MG PO TABS
1.0000 mg | ORAL_TABLET | Freq: Three times a day (TID) | ORAL | Status: DC
  Administered 2019-11-17 – 2019-11-21 (×12): 1 mg via ORAL
  Filled 2019-11-17 (×12): qty 1

## 2019-11-17 MED ORDER — LORAZEPAM 1 MG PO TABS: 1.0000 mg | ORAL_TABLET | ORAL | Status: AC | PRN

## 2019-11-17 MED ORDER — ALUM & MAG HYDROXIDE-SIMETH 200-200-20 MG/5ML PO SUSP: 30.0000 mL | ORAL | Status: DC | PRN

## 2019-11-17 MED ORDER — FOLIC ACID 1 MG PO TABS
1.0000 mg | ORAL_TABLET | Freq: Every day | ORAL | Status: DC
  Administered 2019-11-17 – 2019-11-21 (×5): 1 mg via ORAL
  Filled 2019-11-17 (×6): qty 1

## 2019-11-17 NOTE — [facility]HH Suicide Risk Assessment (Signed)
[facility] Admission Suicide Risk Assessment   Nursing information obtained from:  Patient Demographic factors:  Male, Unemployed, Adolescent or young adult, Caucasian Current Mental Status:  Suicidal ideation indicated by patient, Suicidal ideation indicated by others, Intention to act on suicide plan, Suicide plan, Plan includes specific time, place, or method, Self-harm thoughts Loss Factors:  Decrease in vocational status, Decline in physical health, Financial problems / change in socioeconomic status Historical Factors:  Impulsivity Risk Reduction Factors:  Positive coping skills or problem solving skills, Living with another person, especially a relative, Sense of responsibility to family, Positive therapeutic relationship  Total Time spent with patient: 1 hour Principal Problem: <principal problem not specified> Diagnosis:  Active Problems:   MDD (major depressive disorder), recurrent episode, severe (HCC)   Data: 29 year old male with severe anxiety and alcohol use disorder   Continued Clinical Symptoms:  Alcohol Use Disorder Identification Test Final Score (AUDIT): 40 The "Alcohol Use Disorders Identification Test", Guidelines for Use in Primary Care, Second Edition.  World Science writer [facility]). Score between 0-7:  no or low risk or alcohol related problems. Score between 8-15:  moderate risk of alcohol related problems. Score between 16-19:  high risk of alcohol related problems. Score 20 or above:  warrants further diagnostic evaluation for alcohol dependence and treatment.   CLINICAL FACTORS:   Severe Anxiety and/or Agitation Alcohol/Substance Abuse/Dependencies  See HPI for Mental status exam   COGNITIVE FEATURES THAT CONTRIBUTE TO RISK:  None    SUICIDE RISK:   Minimal: No identifiable suicidal ideation.  Patients presenting with no risk factors but with morbid ruminations; may be classified as minimal risk based on the severity of the depressive symptoms  PLAN OF  CARE: Continue care on inpatient basis  I certify that inpatient services furnished can reasonably be expected to improve the patient's condition.   Clement Sayres, MD 11/17/2019, 1:44 PM

## 2019-11-17 NOTE — ED Notes (Signed)
Attempted to call report to behavioral health. No nurse available right now. Secretary states they can call back around 9:15

## 2019-11-17 NOTE — Progress Notes (Signed)
Recreation Therapy Notes  Date:  1.22.20 Time: 0930 Location: 300 Hall Group Room  Group Topic: Stress Management  Goal Area(s) Addresses:  Patient will identify positive stress management techniques. Patient will identify benefits of using stress management post d/c.  Intervention: Stress Management  Activity: Meditation.  LRT played a meditation that focused on the pure possibilities in your day.  Patients were to listen and follow along as meditation played to fully engage.    Education:  Stress Management, Discharge Planning.   Education Outcome: Acknowledges Education  Clinical Observations/Feedback: Pt did not attend group activity.    Cheyenne Bordeaux, LRT/CTRS         Mohamed Portlock A 11/17/2019 12:05 PM 

## 2019-11-17 NOTE — ED Notes (Signed)
Thomas [name], mother would like an update [phone].

## 2019-11-17 NOTE — ED Notes (Signed)
Report called to [facility] [id number] RN Kathlene November

## 2019-11-17 NOTE — Tx Team (Signed)
Initial Treatment Plan 11/17/2019 12:06 PM [facility] PHX:[phone]    PATIENT STRESSORS: Educational concerns Financial difficulties Health problems Occupational concerns Substance abuse   PATIENT STRENGTHS: Ability for insight Average or above average intelligence Financial means Motivation for treatment/growth Supportive family/friends Work skills   PATIENT IDENTIFIED PROBLEMS: anxiety  depression  Substance use/abuse  Financial strain  Occupational concerns             DISCHARGE CRITERIA:  Ability to meet basic life and health needs Improved stabilization in mood, thinking, and/or behavior Motivation to continue treatment in a less acute level of care Need for constant or close observation no longer present  PRELIMINARY DISCHARGE PLAN: Attend aftercare/continuing care group Attend 12-step recovery group Return to previous living arrangement  PATIENT/FAMILY INVOLVEMENT: This treatment plan has been presented to and reviewed with the patient, TERRYL MOLINELLI.  The patient and family have been given the opportunity to ask questions and make suggestions.  Raylene Miyamoto, RN 11/17/2019, 12:06 PM

## 2019-11-17 NOTE — ED Notes (Signed)
[facility] non-emergent line called to arrange transport. Dispatcher states they will have someone come as soon as possible

## 2019-11-17 NOTE — BH Assessment (Addendum)
Tele Assessment Note   Patient Name: Thomas [name] MRN: [id number] Referring Physician: Roxy Horseman, PA-C Location of Patient: [facility] ED, [phone] Location of Provider: Behavioral Health TTS Department  Thomas [name] is an 29 y.o. single male who presents unaccompanied to [facility] ED via Patent examiner after being petitioned for involuntary commitment by his mother, Blair Lundeen [phone]. Affidavit and petition states: "[name] has generalized anxiety and panic disorder and takes Zolofy, Seroquel and Gabapentin. [name] drinks 24 cans of beer a day and is constantly vomiting. [name] says that he does not want to live anymore and wants to purchase a gun but cannot walk. Not eating, sleeping, and having constant panic attacks. [name] is not responding to the medication that was prescribed to him."  Pt reports his anxiety started in July 2021 and has become increasingly severe over the past six weeks. He says he is having "constant panic attacks." He says he recently quit his job because he is unable to function. He says he sits alone in a dark room because he cannot tolerate light or sound. He states he drinks 20-25 beers daily and experiences withdrawal symptoms when he tries to stop. Pt acknowledges symptoms including social withdrawal, loss of interest in usual pleasures, fatigue, irritability, decreased concentration, and feelings of guilt, worthlessness and hopelessness. He says he cannot sleep. He reports if he eats then he vomits. He says he believes he may be experiencing auditory hallucinations of hearing his deceased dog bark. He denies current suicidal ideation or history of suicide attempts. He denies intentional self-injurious behavior. He denies current homicidal ideation or history of aggression. Pt states he tried marijuana 1-2 months ago. He denies other substance use.  Pt says his anxiety started in July 2021 following graduation from school and  obtaining a job in his field. He says he hated to work, quit the job, and believes he entered the wrong field. He states he feels like a failure and that he is unhappy with his life. He says he lives with his mother. He identifies his mother, father and grandmother as his primary supports. He denies history of abuse or trauma. He denies legal problems. He denies access to firearms. Pt is currently receiving outpatient medication management with Toy Cookey, NP. He denies history of inpatient psychiatric treatment.  Pt is dressed in hospital scrubs, alert and oriented x4. Pt speaks in a clear tone, at moderate volume and normal pace. Motor behavior appears normal. Eye contact is fair. Pt's mood is anxious and depressed, affect is congruent with mood. Thought process is coherent and relevant. There is no indication Pt is currently responding to internal stimuli or experiencing delusional thought content. Pt appears to have some difficulty concentrating. He says he wants to be discharged because he has a job interview at 0900.    Diagnosis:  F32.2 Major depressive disorder, Single episode, Severe F41.0 Panic disorder F10.20 Alcohol use disorder, Severe  Past Medical History:  Past Medical History:  Diagnosis Date  . Anxiety     Past Surgical History:  Procedure Laterality Date  . APPENDECTOMY      Family History: No family history on file.  Social History:  reports that he has never smoked. He does not have any smokeless tobacco history on file. He reports current alcohol use. He reports that he does not use drugs.  Additional Social History:  Alcohol / Drug Use Pain Medications: Denies abuse Prescriptions: Denies abuse Over the Counter: Denies abuse History of alcohol /  drug use?: Yes Longest period of sobriety (when/how long): unknown Negative Consequences of Use: Personal relationships, Work / School Withdrawal Symptoms: Tremors, Nausea / Vomiting, Sweats Substance #1 Name of  Substance 1: Alcohol 1 - Age of First Use: unknown 1 - Amount (size/oz): 20-25 cans of beer 1 - Frequency: Daily 1 - Duration: Using daily for six weeks 1 - Last Use / Amount: 11/16/2019 Substance #2 Name of Substance 2: Marijuana 2 - Age of First Use: 29 2 - Amount (size/oz): unknown 2 - Frequency: Reports trying once 2 - Duration: Reports trying once 2 - Last Use / Amount: 1-2 months ago  CIWA: CIWA-Ar BP: (!) 142/76 Pulse Rate: (!) 110 COWS:    Allergies: No Known Allergies  Home Medications: (Not in a hospital admission)   OB/GYN Status:  No LMP for male patient.  General Assessment Data Location of Assessment: [facility] ED TTS Assessment: In system Is this a Tele or Face-to-Face Assessment?: Tele Assessment Is this an Initial Assessment or a Re-assessment for this encounter?: Initial Assessment Patient Accompanied by:: N/A Language Other than English: No Living Arrangements: Other (Comment) (Lives with mother) What gender do you identify as?: Male Date Telepsych consult ordered in [facility]: 11/16/19 Time Telepsych consult ordered in [facility]: 2257 Marital status: Single Maiden name: NA Pregnancy Status: No Living Arrangements: Parent Can pt return to current living arrangement?: Yes Admission Status: Involuntary Petitioner: Family member Is patient capable of signing voluntary admission?: Yes Referral Source: Self/Family/Friend Insurance type: Self-pay     Crisis Care Plan Living Arrangements: Parent Legal Guardian: Other: (Self) Name of Psychiatrist: Toy Cookey, NP Name of Therapist: None  Education Status Is patient currently in school?: No Is the patient employed, unemployed or receiving disability?: Unemployed  Risk to self with the past 6 months Suicidal Ideation: Yes-Currently Present Has patient been a risk to self within the past 6 months prior to admission? : Yes Suicidal Intent: No Has patient had any suicidal intent within the past 6 months prior  to admission? : Yes Is patient at risk for suicide?: Yes Suicidal Plan?: Yes-Currently Present Has patient had any suicidal plan within the past 6 months prior to admission? : Yes Specify Current Suicidal Plan: Shoot himself Access to Means: No What has been your use of drugs/alcohol within the last 12 months?: Pt reports drinking 20-25 cans of beer daily Previous Attempts/Gestures: No How many times?: 0 Other Self Harm Risks: None Triggers for Past Attempts: None known Intentional Self Injurious Behavior: None Family Suicide History: No Recent stressful life event(s): Job Loss Persecutory voices/beliefs?: No Depression: Yes Depression Symptoms: Despondent, Insomnia, Tearfulness, Isolating, Fatigue, Guilt, Loss of interest in usual pleasures, Feeling worthless/self pity, Feeling angry/irritable Substance abuse history and/or treatment for substance abuse?: No Suicide prevention information given to non-admitted patients: Not applicable  Risk to Others within the past 6 months Homicidal Ideation: No Does patient have any lifetime risk of violence toward others beyond the six months prior to admission? : No Thoughts of Harm to Others: No Current Homicidal Intent: No Current Homicidal Plan: No Access to Homicidal Means: No Identified Victim: None History of harm to others?: No Assessment of Violence: None Noted Violent Behavior Description: Pt denies history of aggression Does patient have access to weapons?: No Criminal Charges Pending?: No Describe Pending Criminal Charges: None Does patient have a court date: No Is patient on probation?: No  Psychosis Hallucinations: None noted Delusions: None noted  Mental Status Report Appearance/Hygiene: In scrubs Eye Contact: Fair Motor Activity:  Freedom of movement, Unremarkable Speech: Logical/coherent Level of Consciousness: Alert Mood: Depressed, Anxious Affect: Anxious, Depressed Anxiety Level: Panic Attacks Panic attack  frequency: Daily Most recent panic attack: Today Thought Processes: Coherent, Relevant Judgement: Impaired Orientation: Person, Place, Time, Situation Obsessive Compulsive Thoughts/Behaviors: None  Cognitive Functioning Concentration: Decreased Memory: Recent Intact, Remote Intact Is patient IDD: No Insight: Fair Impulse Control: Fair Appetite: Poor Have you had any weight changes? : No Change Sleep: Decreased Total Hours of Sleep: 3 Vegetative Symptoms: None  ADLScreening [facility] Assessment Services) Patient's cognitive ability adequate to safely complete daily activities?: Yes Patient able to express need for assistance with ADLs?: Yes Independently performs ADLs?: Yes (appropriate for developmental age)  Prior Inpatient Therapy Prior Inpatient Therapy: No  Prior Outpatient Therapy Prior Outpatient Therapy: Yes Prior Therapy Dates: Current Prior Therapy Facilty/Provider(s): Sherril Cong, NP Reason for Treatment: Anxiety, panic disorder Does patient have an ACCT team?: No Does patient have Intensive In-House Services?  : No Does patient have [facility] services? : No Does patient have P4CC services?: No  ADL Screening (condition at time of admission) Patient's cognitive ability adequate to safely complete daily activities?: Yes Is the patient deaf or have difficulty hearing?: No Does the patient have difficulty seeing, even when wearing glasses/contacts?: No Does the patient have difficulty concentrating, remembering, or making decisions?: No Patient able to express need for assistance with ADLs?: Yes Does the patient have difficulty dressing or bathing?: No Independently performs ADLs?: Yes (appropriate for developmental age) Does the patient have difficulty walking or climbing stairs?: No Weakness of Legs: None Weakness of Arms/Hands: None  Home Assistive Devices/Equipment Home Assistive Devices/Equipment: None    Abuse/Neglect Assessment (Assessment to be  complete while patient is alone) Abuse/Neglect Assessment Can Be Completed: Yes Physical Abuse: Denies Verbal Abuse: Denies Sexual Abuse: Denies Exploitation of patient/patient's resources: Denies Self-Neglect: Denies     Merchant navy officer (For Healthcare) Does Patient Have a Medical Advance Directive?: No Would patient like information on creating a medical advance directive?: No - Patient declined          Disposition: Gave clinical report to Lerry Liner, NP who said Pt meets criteria for inpatient dual-diagnosis treatment. Pt has been accepted to the service of Dr. Tonita Phoenix, room 301-2, and can be transferred after 0800. Number for RN report is [phone]. Notified Roxy Horseman, PA-C and Junior Rimando, RN of recommendation.  Disposition Initial Assessment Completed for this Encounter: Yes  This service was provided via telemedicine using a 2-way, interactive audio and video technology.  Names of all persons participating in this telemedicine service and their role in this encounter. Name: [name] Role: Patient  Name: Shela Commons, [facility] Role: TTS counselor         Harlin Rain Patsy [place], [facility] Triage Specialist [phone]  Pamalee Leyden 11/17/2019 12:05 AM

## 2019-11-17 NOTE — [facility] Group Notes (Signed)
[facility] LCSW Group Therapy  11/17/2019 2:02 PM  Type of Therapy:  Coping Skills   Participation Level:  None  Participation Quality:  Inattentive  Affect:  Anxious  Cognitive:  Confused  Insight:  Lacking  Engagement in Therapy:  Developing/Improving  Modes of Intervention:  Activity and Discussion  Summary of Progress/Problems: Thomas [name] attended group outside but did not share anything about coping skills.  Thomas [name] did not speak to anyone and spent time walking sitting by himself.  Thomas [name] appeared anxious and paranoid during group by looking around often and keeping distance from his peers.   Met[employer] Kung Hyde Sires 11/17/2019, 2:02 PM
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
## 2019-11-17 NOTE — Progress Notes (Signed)
Patient ID: Thomas [name], male   DOB: Jun 23, 1990, 29 y.o.   MRN: [id number] Admission Note  Pt is a 29 yo male that presents IVC'd on 11/17/2019 with worsening anxiety, depression, substance use/abuse, financial strain, and occupational concerns. Pt expressed suicidal ideations to purchase a gun and shoot themselves. Pt states they live with their mom and grandmother. Pt expresses guilt/concerns with finishing school, getting a job in their career field and then not wanting to continue this. Pt states they drink 20+ beers/day and they have issues when they try to stop. Pt presents anxious and worried. From report pt is stated as having ah with hearing their dead dog barking. Pt denies this now. Pt is pleasant. Pt denies any current si/hi/ah/vh and verbally agrees to approach staff if these become apparent or before harming themselves/others while at [facility]. Consents signed, handbook detailing the patient's rights, responsibilities, and visitor guidelines provided. Skin/belongings search completed and patient oriented to unit. Patient stable at this time. Patient given the opportunity to express concerns and ask questions. Patient given toiletries. Will continue to monitor.   [facility] Assessment 11/12:  Thomas [name] is an 29 y.o. single male who presents unaccompanied to [facility] via [facility] after being petitioned for involuntary commitment by his mother, Thomas [name] [phone]. Affidavit and petition states: "[name] has generalized anxiety and panic disorder and takes Zolofy, Seroquel and Gabapentin. [name] drinks 24 cans of beer a day and is constantly vomiting. [name] says that he does not want to live anymore and wants to purchase a gun but cannot walk. Not eating, sleeping, and having constant panic attacks. [name] is not responding to the medication that was prescribed to him."  Pt reports his anxiety started in July 2021 and has become increasingly severe over  the past six weeks. He says he is having "constant panic attacks." He says he recently quit his job because he is unable to function. He says he sits alone in a dark room because he cannot tolerate light or sound. He states he drinks 20-25 beers daily and experiences withdrawal symptoms when he tries to stop. Pt acknowledges symptoms including social withdrawal, loss of interest in usual pleasures, fatigue, irritability, decreased concentration, and feelings of guilt, worthlessness and hopelessness. He says he cannot sleep. He reports if he eats then he vomits. He says he believes he may be experiencing auditory hallucinations of hearing his deceased dog bark. He denies current suicidal ideation or history of suicide attempts. He denies intentional self-injurious behavior. He denies current homicidal ideation or history of aggression. Pt states he tried marijuana 1-2 months ago. He denies other substance use.  Pt says his anxiety started in July 2021 following graduation from school and obtaining a job in his field. He says he hated to work, quit the job, and believes he entered the wrong field. He states he feels like a failure and that he is unhappy with his life. He says he lives with his mother. He identifies his mother, father and grandmother as his primary supports. He denies history of abuse or trauma. He denies legal problems. He denies access to firearms. Pt is currently receiving outpatient medication management with Toy Cookey, NP. He denies history of inpatient psychiatric treatment.  Pt is dressed in hospital scrubs, alert and oriented x4. Pt speaks in a clear tone, at moderate volume and normal pace. Motor behavior appears normal. Eye contact is fair. Pt's mood is anxious and depressed, affect is congruent with mood. Thought  process is coherent and relevant. There is no indication Pt is currently responding to internal stimuli or experiencing delusional thought content. Pt appears to have  some difficulty concentrating. He says he wants to be discharged because he has a job interview at 0900.

## 2019-11-17 NOTE — [facility] Counselor (Signed)
Adult Comprehensive Assessment  Patient ID: Thomas [name], male   DOB: [birth year], 29 y.o.   MRN: [id number]  Information Source: Information source: Patient  Current Stressors:  Patient states their primary concerns and needs for treatment are:: "Panic attacks and anxiety. I can't sleep or eat." Patient states their goals for this hospitilization and ongoing recovery are:: "get on medications" Employment / Job issues: pt shared that he recently quit his job of 5 years because it was very stressful and his boss was verbally abusive but he is starting a new one in 2 weeks. Family Relationships: pt shared that his Dad may be getting ready to go to prison; pt shared he has recently had issues with his girlfriend "Things went bad about 4-5 weeks ago. I got really upset with her...not like physically, it was all over the phone but she hasnt talked to me since. She ended up in the hospitial because of everything and I think she just wants to be alone." Social relationships: "I don't really have any friends. 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 I got really upset with her...not like physically, it was all over the phone but she hasnt talked to me since. She ended up in the hospitial because of everything and I think she just wants to be  alone." Are you sexually active?: Yes What is your sexual orientation?: heterosexual Has your sexual activity been affected by drugs, alcohol, medication, or emotional stress?: none reported Does patient have children?: No  Childhood History:  By whom was/is the patient raised?: Both parents Additional childhood history information: pt stated that his parents seperated when he was 29 years old Description of patient's relationship with caregiver when they were a child: "Good. My dad was a serious person. He wasn't talkative and wasn't around much." Patient's description of current relationship with people who raised him/her: Dad: close Mom: close but have been arguing the past couple of weeks bc of the pt's drinking How were you disciplined when you got in trouble as a child/adolescent?: "spanked" Does patient have siblings?: Yes Number of Siblings: 1 Description of patient's current relationship with siblings: "we have never really been close. When I try to talk to her about this stuff she just yells at me and gets mad." Did patient suffer any verbal/emotional/physical/sexual abuse as a child?: No Did patient suffer from severe childhood neglect?: No Has patient ever been sexually abused/assaulted/raped as an adolescent or adult?: No Was the patient ever a victim of a crime or a disaster?: No Witnessed domestic violence?: No Has patient been affected by domestic violence as an adult?: No  Education:  Highest grade of school patient has completed: 2 year degree Currently a student?: No Learning disability?: No  Employment/Work Situation:   Employment situation: Employed Where is patient currently employed?: pt reports that he is starting a new job in 2  weeks How long has patient been employed?: pt reports that he is starting a new job in 2 weeks Patient's job has been impacted by current illness: Yes Describe how patient's job has been impacted: pt reported that he job he just quit  caused him more anxiety What is the longest time patient has a held a job?: 5 years Where was the patient employed at that time?: [employer] Has patient ever been in the [employer]?: No  Financial Resources:   Surveyor, quantity resources: Income from employment Does patient have a representative payee or guardian?: No  Alcohol/Substance Abuse:   What has been your use of drugs/alcohol within the last 12 months?: Pt reports drinking 20-25 cans of beer daily and smoking marijuana 2x daily If attempted suicide, did drugs/alcohol play a role in this?: No (pt reports that he has never attempted suicide but he has thought about it when under the influence) Alcohol/Substance Abuse Treatment Hx: Denies past history Has alcohol/substance abuse ever caused legal problems?: Yes (I got a DUI a few years ago)  Social Support System:   Patient's Community Support System: Production assistant, radio System: mom and grandma Type of faith/religion: none reported  Leisure/Recreation:   Do You Have Hobbies?: Yes Leisure and Hobbies: Drink and eat with my girlfriend and do yard work  Strengths/Needs:   What is the patient's perception of their strengths?: "I don't know right now.I feel helpless."  Discharge Plan:   Currently receiving community mental health services: Yes (From Whom) (pt reports he has a psychiatrist but declined to share who it was) Does patient have access to transportation?: Yes Does patient have financial barriers related to discharge medications?: No Will patient be returning to same living situation after discharge?: Yes  Summary/Recommendations:   Summary and Recommendations (to be completed by the evaluator): KARA MIERZEJEWSKI is a 29 y.o. single male who presents unaccompanied to [facility] via [facility] after being petitioned for involuntary commitment by his mother, Levin Dagostino [phone]. Affidavit and petition states: "[name] has generalized anxiety and panic  disorder and takes Zoloft, Seroquel and Gabapentin. [name] drinks 24 cans of beer a day and is constantly vomiting. [name] says that he does not want to live anymore and wants to purchase a gun but cannot walk. Not eating, sleeping, and having constant panic attacks. [name] is not responding to the medication that was prescribed to him." Pt reports his anxiety started in July 2021 and has become increasingly severe over the past six weeks. He says he is having "constant panic attacks." He says he recently quit his job because he is unable to function. He says he sits alone in a dark room because he cannot tolerate light or sound. He states he drinks 20-25 beers daily and experiences withdrawal symptoms when he tries to stop. Pt acknowledges symptoms including social withdrawal, loss of interest in usual pleasures, fatigue, irritability, decreased concentration, and feelings of guilt, worthlessness and hopelessness. He says he cannot sleep. He reports if he eats then he vomits. He says he believes he may be experiencing auditory hallucinations of hearing his deceased dog bark. He denies current suicidal ideation or history of suicide attempts. He denies intentional self-injurious behavior. He denies current homicidal ideation or history of aggression. Pt states he tried marijuana 1-2 months ago. He denies other substance use. Pt says his anxiety started in July 2021 following graduation from school and obtaining a job in his field. He says he hated to work, quit the job,  and believes he entered the wrong field. He states he feels like a failure and that he is unhappy with his life. He says he lives with his mother. He identifies his mother, father and grandmother as his primary supports. He denies history of abuse or trauma. He denies legal problems. He denies access to firearms. Pt is currently receiving outpatient medication management with Toy Cookey, NP. He denies history of inpatient  psychiatric treatment. While here, Augusto Deckman can benefit from crisis stabilization, medication management, therapeutic milieu, and referrals for services.  Felizardo Hoffmann. 11/17/2019

## 2019-11-18 LAB — LIPID PANEL
Cholesterol: 255 mg/dL — ABNORMAL HIGH (ref 0–200)
HDL: 90 mg/dL (ref >40)
LDL Cholesterol: 144 mg/dL — ABNORMAL HIGH (ref 0–99)
Total CHOL/HDL Ratio: 2.8 RATIO
Triglycerides: 107 mg/dL (ref <150)
VLDL: 21 mg/dL (ref 0–40)

## 2019-11-18 LAB — HEMOGLOBIN A1C
Hgb A1c MFr Bld: 5.1 % (ref 4.8–5.6)
Mean Plasma Glucose: 99.67 mg/dL

## 2019-11-18 LAB — TSH: TSH: 1.422 u[IU]/mL (ref 0.350–4.500)

## 2019-11-18 NOTE — Progress Notes (Signed)
   11/18/19 2005  COVID-19 Daily Checkoff  Have you had a fever (temp > 37.80C/100F)  in the past 24 hours?  No  COVID-19 EXPOSURE  Have you traveled outside the state in the past 14 days? No  Have you been in contact with someone with a confirmed diagnosis of COVID-19 or PUI in the past 14 days without wearing appropriate PPE? No  Have you been living in the same home as a person with confirmed diagnosis of COVID-19 or a PUI (household contact)? No  Have you been diagnosed with COVID-19? No

## 2019-11-18 NOTE — [facility] Group Notes (Signed)
.  Psychoeducational Group Note  Date: 11-17-19 Time: 0900-1000    Goal Setting   Purpose of Group: This group helps to provide patients with the steps of setting [name] goal that is specific, measurable, attainable, realistic and time specific. [name] discussion on how we keep ourselves stuck with negative self talk.    Participation Level:  Did not attend   Thomas [name]  

## 2019-11-18 NOTE — [facility] Group Notes (Signed)
LCSW Group Therapy Note 11/18/2019  10:00-11:00am  Type of Therapy and Topic:  Group Therapy: Anger   Participation Level:  Active   Description of Group: In this group, patients identified a recent time they became angry and how they reacted.  They analyzed how their reaction was possibly beneficial and how it was possibly unhelpful.  They learned how to recognize the physical responses they have to anger-provoking situations that can alert them to the possibility that they are becoming angry.  They learned the difference between healthy coping skills that work and keep working versus unhealthy coping skills that work initially then start hurting. They were taught that anger is a secondary emotion fueled by an underlying feeling.  They explored what their own underlying emotions were during the incident they discussed in group.  Therapeutic Goals: Patients will remember their last incident of anger and how they felt emotionally and physically. Patients will identify how their behavior at that time worked for them, as well as how it worked against them. Patients will be able to identify their reaction as healthy or unhealthy, and identify possible reactions that would have been the opposite Patients will learn that anger itself is a secondary emotion and will think about their primary emotion at the time of their last incident of anger  Summary of Patient Progress:  The patient shared that his most recent time of anger was ongoing throughout the last 6 weeks and said he has been experiencing ongoing panic attacks, has been drinking heavily to deal with this.  He stated he is frustrated because he cannot find solutions, and he is up to drinking 20 beers daily.  His family is getting with him and he is angry all the time and has a bad attitude.  He stated a lot of his anger is at himself.  His underlying emotion to his anger is anxiety.  Therapeutic Modalities:   Cognitive Behavioral  Therapy  Lynnell Chad, LCSW 11/18/2019  9:18 AM

## 2019-11-18 NOTE — Progress Notes (Signed)
D. Pt presents with an anxious affect and anxious, depressed mood- Per pt's self inventory, pt rated his depression, hopelessness and anxiety all 5's. Pt states that his goal today is to work on "panic attacks and anxiety", and that his anxiety is worse in the am, reporting that he sometimes wakes up sweating. Pt currently denies SI/HI and AVH A. Labs and vitals monitored. Pt compliant with medications. Pt supported emotionally and encouraged to express concerns and ask questions.   R. Pt remains safe with 15 minute checks. Will continue POC.

## 2019-11-18 NOTE — Progress Notes (Signed)
[facility] MD Progress Note  11/18/2019 10:24 AM Thomas [name]  MRN:  [id number] Subjective: Patient is a 29 year old male who appears his stated age. He lives with his mother and grandmother. Currently he is between jobs. He appears very anxious, leg bouncing and fidgeting.  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 Discussed the likelihood of this being a response to anxiety. He has some hand tremors, likely from alcohol withdrawal and anxiety. He stated he feels better today, less anxious. He stated he slept well last night but woke up in a sweat. Patient believes it was a panic attack, likely it is from alcohol withdrawal. Patient stated he has always had panic attacks and when he was put on the other medication before coming to the hospital (low dose of Zoloft) he felt even worse and had too much energy, he felt like he could not stop his mind from thinking.  His concentration is fair.   Patient is minimizing his alcohol use. He has difficulty believing his alcohol use is a problem and believes this will be fixed when he starts a new job. Patient stated that alcoholics drink liquor and he only drinks lite beer so he isn't getting that much alcohol.   He believes his relationship with his girlfriend will be better once she sees he is trying to fix himself.  He denies any withdrawal symptoms such as sweating, nausea or vomiting. Patient is on scheduled Klonopin 1 mg TID. Patient stated he is eating better. Discussed with patient the possibility of support groups like AA and he stated "I am not an alcoholic, I don't even like drinking. I drank because of my job and because I was anxious." He denies suicidal and homicidal ideation, denies hallucinations and paranoia. He asked how long he will be here. Explained that he may be discharged the first of the week if everything continues to progress and he is doing well on his medications.    Principal Problem: Severe episode  of recurrent major depressive disorder, without psychotic features (HCC) Diagnosis: Principal Problem:   Severe episode of recurrent major depressive disorder, without psychotic features (HCC) Active Problems:   Generalized anxiety disorder   Insomnia due to anxiety and fear  Total Time spent with patient: 30 minutes  Past Psychiatric History: Patient was seen approximately 1 month prior.  At that time he was placed on low-dose Zoloft.  Past Medical History:  Past Medical History:  Diagnosis Date   Anxiety     Past Surgical History:  Procedure Laterality Date   APPENDECTOMY     Family History: History reviewed. No pertinent family history. Family Psychiatric  History: See H&P Social History:  Social History   Substance and Sexual Activity  Alcohol Use Yes   Comment: reported 20+ beers/day     Social History   Substance and Sexual Activity  Drug Use No    Social History   Socioeconomic History   Marital status: Single    Spouse name: Not on file   Number of children: Not on file   Years of education: Not on file   Highest education level: Not on file  Occupational History   Not on file  Tobacco Use   Smoking status: Never Smoker   Smokeless tobacco: Never Used  Vaping Use   Vaping Use: Never used  Substance and Sexual Activity   Alcohol use: Yes    Comment: reported 20+ beers/day  Drug use: No   Sexual activity: Yes  Other Topics Concern   Not on file  Social History Narrative   Not on file   Social Determinants of Health   Financial Resource Strain:    Difficulty of Paying Living Expenses: Not on file  Food Insecurity:    Worried About Running Out of Food in the Last Year: Not on file   Ran Out of Food in the Last Year: Not on file  Transportation Needs:    Lack of Transportation (Medical): Not on file   Lack of Transportation (Non-Medical): Not on file  Physical Activity:    Days of Exercise per Week: Not on file   Minutes  of Exercise per Session: Not on file  Stress:    Feeling of Stress : Not on file  Social Connections:    Frequency of Communication with Friends and Family: Not on file   Frequency of Social Gatherings with Friends and Family: Not on file   Attends Religious Services: Not on file   Active Member of Clubs or Organizations: Not on file   Attends Banker Meetings: Not on file   Marital Status: Not on file   Additional Social History:                         Sleep: Fair  Appetite:  Fair  Current Medications: Current Facility-Administered Medications  Medication Dose Route Frequency Provider Last Rate Last Admin   acetaminophen (TYLENOL) tablet 650 mg  650 mg Oral Q6H PRN [name], [name], PA       alum & mag hydroxide-simeth (MAALOX/MYLANTA) 200-200-20 MG/5ML suspension 30 mL  30 mL Oral Q4H PRN [name], [name], PA       clonazePAM (KLONOPIN) tablet 1 mg  1 mg Oral TID [name], [name], MD   1 mg at 11/18/19 0815   folic acid (FOLVITE) tablet 1 mg  1 mg Oral Daily [name], [name], MD   1 mg at 11/18/19 0816   hydrOXYzine (ATARAX/VISTARIL) tablet 25 mg  25 mg Oral TID PRN Karel Jarvis E, PA   25 mg at 11/17/19 2151   LORazepam (ATIVAN) tablet 1-4 mg  1-4 mg Oral Q1H PRN [name], [name], MD       Or   LORazepam (ATIVAN) injection 1-4 mg  1-4 mg Intravenous Q1H PRN [name], [name], MD       magnesium hydroxide (MILK OF MAGNESIA) suspension 30 mL  30 mL Oral Daily PRN [name], [name], PA       multivitamin with minerals tablet 1 tablet  1 tablet Oral Daily [name], [name], MD   1 tablet at 11/18/19 0815   QUEtiapine (SEROQUEL) tablet 100 mg  100 mg Oral QHS [name], [name], MD   100 mg at 11/17/19 2151   sertraline (ZOLOFT) tablet 100 mg  100 mg Oral Daily [name], [name], MD   100 mg at 11/18/19 0815   thiamine tablet 100 mg  100 mg Oral Daily [name], [name], MD   100 mg at 11/18/19 0539   Or   thiamine (B-1) injection  100 mg  100 mg Intramuscular Daily [name], [name], MD       traZODone (DESYREL) tablet 100 mg  100 mg Oral QHS PRN [name], [name], PA   100 mg at 11/17/19 2151    Lab Results:  Results for orders placed or performed during the hospital encounter of 11/17/19 (  from the past 48 hour(s))  Comprehensive metabolic panel     Status: Abnormal   Collection Time: 11/17/19  5:54 PM  Result Value Ref Range   Sodium 130 (L) 135 - 145 mmol/L   Potassium 3.4 (L) 3.5 - 5.1 mmol/L   Chloride 89 (L) 98 - 111 mmol/L   CO2 26 22 - 32 mmol/L   Glucose, Bld 166 (H) 70 - 99 mg/dL    Comment: Glucose reference range applies only to samples taken after fasting for at least 8 hours.   BUN 13 6 - 20 mg/dL   Creatinine, Ser 4.40 0.61 - 1.24 mg/dL   Calcium 9.8 8.9 - 10.2 mg/dL   Total Protein 8.6 (H) 6.5 - 8.1 g/dL   Albumin 4.9 3.5 - 5.0 g/dL   AST 725 (H) 15 - 41 U/L   ALT 247 (H) 0 - 44 U/L   Alkaline Phosphatase 83 38 - 126 U/L   Total Bilirubin 1.4 (H) 0.3 - 1.2 mg/dL   GFR, Estimated >36 >64 mL/min    Comment: (NOTE) Calculated using the CKD-EPI Creatinine Equation (2021)    Anion gap 15 5 - 15    Comment: Performed at [facility], [address], [place], [place] [address]  Magnesium     Status: None   Collection Time: 11/17/19  5:54 PM  Result Value Ref Range   Magnesium 2.2 1.7 - 2.4 mg/dL    Comment: Performed at [facility], [address] [place], [place] [address]  Phosphorus     Status: None   Collection Time: 11/17/19  5:54 PM  Result Value Ref Range   Phosphorus 2.7 2.5 - 4.6 mg/dL    Comment: Performed at [facility], [address], [place], [place] [address]  CBC     Status: None   Collection Time: 11/17/19  5:54 PM  Result Value Ref Range   WBC 7.2 4.0 - 10.5 K/uL   RBC 5.04 4.22 - 5.81 MIL/uL   Hemoglobin 16.2 13.0 - 17.0 g/dL   HCT 64.3 39 - 52 %   MCV 90.9 80.0 - 100.0 fL   MCH 32.1 26.0 - 34.0 pg   MCHC  35.4 30.0 - 36.0 g/dL   RDW 32.9 51.8 - 84.1 %   Platelets 247 150 - 400 K/uL   nRBC 0.0 0.0 - 0.2 %    Comment: Performed at [facility], [address] [place], [place] [address]  Lipid panel     Status: Abnormal   Collection Time: 11/18/19  8:28 AM  Result Value Ref Range   Cholesterol 255 (H) 0 - 200 mg/dL   Triglycerides 016 <010 mg/dL   HDL 90 >93 mg/dL   Total CHOL/HDL Ratio 2.8 RATIO   VLDL 21 0 - 40 mg/dL   LDL Cholesterol 235 (H) 0 - 99 mg/dL    Comment:        Total Cholesterol/HDL:CHD Risk Coronary Heart Disease Risk Table                     Men   Women  1/2 Average Risk   3.4   3.3  Average Risk       5.0   4.4  2 X Average Risk   9.6   7.1  3 X Average Risk  23.4   11.0        Use the calculated Patient Ratio above and the CHD Risk  Table to determine the patient's CHD Risk.        ATP III CLASSIFICATION (LDL):  <100     mg/dL   Optimal  010-932  mg/dL   Near or Above                    Optimal  130-159  mg/dL   Borderline  355-732  mg/dL   High  >202     mg/dL   Very High Performed at [facility], [address] [place], [place] [address]   TSH     Status: None   Collection Time: 11/18/19  8:28 AM  Result Value Ref Range   TSH 1.422 0.350 - 4.500 uIU/mL    Comment: Performed by a 3rd Generation assay with a functional sensitivity of <=0.01 uIU/mL. Performed at [facility], [address], [place], [place] [address]     Blood Alcohol level:  Lab Results  Component Value Date   ETH 190 (H) 11/16/2019    Metabolic Disorder Labs: No results found for: HGBA1C, MPG No results found for: PROLACTIN Lab Results  Component Value Date   CHOL 255 (H) 11/18/2019   TRIG 107 11/18/2019   HDL 90 11/18/2019   CHOLHDL 2.8 11/18/2019   VLDL 21 11/18/2019   LDLCALC 144 (H) 11/18/2019    Physical Findings: AIMS: Facial and Oral Movements Muscles of Facial Expression: None, normal Lips and  Perioral Area: None, normal Jaw: None, normal Tongue: None, normal,Extremity Movements Upper (arms, wrists, hands, fingers): None, normal Lower (legs, knees, ankles, toes): None, normal, Trunk Movements Neck, shoulders, hips: None, normal, Overall Severity Severity of abnormal movements (highest score from questions above): None, normal Incapacitation due to abnormal movements: None, normal Patient's awareness of abnormal movements (rate only patient's report): No Awareness, Dental Status Current problems with teeth and/or dentures?: No Does patient usually wear dentures?: No  CIWA:    COWS:     Musculoskeletal: Strength & Muscle Tone: within normal limits Gait & Station: normal Patient leans: N/A  Psychiatric Specialty Exam: Physical Exam: Chart reviewed, labs reviewed. No overnight events  Review of Systems: Within normal limits except for that which is mentioned in the HPI  Blood pressure 104/78, pulse (!) 134, temperature (!) 97.5 F (36.4 C), temperature source Oral, resp. rate 20, height 5\' 11"  (1.803 m), weight 72.6 kg, SpO2 95 %.Body mass index is 22.32 kg/m.  General Appearance: Casual and Fairly Groomed  Eye Contact:  Good  Speech:  Clear and Coherent and Normal Rate  Volume:  Normal  Mood:  Anxious and Depressed  Affect:  Appropriate and Congruent  Thought Process:  Coherent, Goal Directed, Linear and Descriptions of Associations: Intact  Orientation:  Full (Time, Place, and Person)  Thought Content:  Logical and Hallucinations: None  Suicidal Thoughts:  Denies  Homicidal Thoughts:  No  Memory:  Immediate;   Good Recent;   Good Remote;   Fair  Judgement:  Fair  Insight:  Fair  Psychomotor Activity:  Restlessness and Tremor  Concentration:  Concentration: Fair and Attention Span: Fair  Recall:  of Knowledge:  Good  Language:  Good  Akathisia:  No  Handed:  Right  AIMS (if indicated):     Assets:  Communication Skills Desire for  Improvement Financial Resources/Insurance Housing Resilience Social Support Talents/Skills Transportation Vocational/Educational  ADL's:  Intact  Cognition:  WNL  Sleep:  Number of Hours: 6.75     Treatment Plan Summary:  Daily contact with patient to assess and evaluate symptoms and progress in treatment, Medication management and Plan medication managment   Problem #1  Anxiety -Continue Zoloft 100 mg PO daily -Continue Klonopin 1 mg PO TID -Continue Seroquel 100 mg PO at bedtime  Problem #2 Alcohol Use Disorder:  -Continue Ativan CIWA protocol -Continue Folic Acid 6260m PO daily -Continue Thiamine 100 mg PO daily   Laveda AbbeLaurie Britton Teion Ballin, NP 11/18/2019, 10:24 AM

## 2019-11-18 NOTE — Progress Notes (Signed)
[facility] NOVEL CORONAVIRUS (COVID-19) DAILY CHECK-OFF SYMPTOMS - answer yes or no to each - every day NO YES  Have you had a fever in the past 24 hours?  . Fever (Temp > 37.80C / 100F) X   Have you had any of these symptoms in the past 24 hours? . New Cough .  Sore Throat  .  Shortness of Breath .  Difficulty Breathing .  Unexplained Body Aches   X   Have you had any one of these symptoms in the past 24 hours not related to allergies?   . Runny Nose .  Nasal Congestion .  Sneezing   X   If you have had runny nose, nasal congestion, sneezing in the past 24 hours, has it worsened?  X   EXPOSURES - check yes or no X   Have you traveled outside the state in the past 14 days?  X   Have you been in contact with someone with a confirmed diagnosis of COVID-19 or PUI in the past 14 days without wearing appropriate PPE?  X   Have you been living in the same home as a person with confirmed diagnosis of COVID-19 or a PUI (household contact)?    X   Have you been diagnosed with COVID-19?    X              What to do next: Answered NO to all: Answered YES to anything:   Proceed with unit schedule Follow the [facility] Inpatient Flowsheet.   

## 2019-11-18 NOTE — [facility] Group Notes (Deleted)
.  Psychoeducational Group Note    Date:11/18/2019 Time: 1300-1400    Life Skills:  A group where two lists are made. What people need and what are things that we do that are healthy. The lists are developed by the patients and it is explained that we often do the actions that are not healthy to get our list of needs met.   Purpose of Group: . The group focus' on teaching patients on how to identify their needs and how to develop the coping skills needed to get their needs met  Participation Level:  Active  Participation Quality:  Appropriate  Affect:  Appropriate  Cognitive:  Oriented  Insight:  Improving  Engagement in Group:  Engaged  Additional Comments:  ...  Paulino Rily

## 2019-11-18 NOTE — Progress Notes (Signed)
Patient rated his day as an 8 out of a possible 10 since he feels that the medication is working (sleeping more). He also feels more relaxed. His goal for tomorrow is to see if the medication is working and to try to eat more.

## 2019-11-18 NOTE — [facility] Group Notes (Signed)
.  Psychoeducational Group Note    Date:11/18/2019 Time: 1300-1400    Life Skills:  A group where two lists are made. What people need and what are things that we do that are healthy. The lists are developed by the patients and it is explained that we often do the actions that are not healthy to get our list of needs met.   Purpose of Group: . The group focus' on teaching patients on how to identify their needs and how to develop the coping skills needed to get their needs met  Participation Level:  Active  Participation Quality:  Appropriate  Affect:  Appropriate  Cognitive:  Oriented  Insight:  Improving  Engagement in Group:  Engaged  Additional Comments:  Pt rates his energy level at a 5/10. Continues to work on his issues of anxiety.  Paulino Rily

## 2019-11-19 DIAGNOSIS — F409 Phobic anxiety disorder, unspecified: Secondary | ICD-10-CM | POA: Diagnosis not present

## 2019-11-19 DIAGNOSIS — F411 Generalized anxiety disorder: Secondary | ICD-10-CM

## 2019-11-19 DIAGNOSIS — F332 Major depressive disorder, recurrent severe without psychotic features: Principal | ICD-10-CM

## 2019-11-19 DIAGNOSIS — F5105 Insomnia due to other mental disorder: Secondary | ICD-10-CM

## 2019-11-19 MED ORDER — NALTREXONE HCL 50 MG PO TABS
50.0000 mg | ORAL_TABLET | Freq: Every day | ORAL | Status: DC
  Administered 2019-11-19 – 2019-11-20 (×2): 50 mg via ORAL
  Filled 2019-11-19: qty 1
  Filled 2019-11-19: qty 7
  Filled 2019-11-19 (×3): qty 1

## 2019-11-19 NOTE — [facility] Group Notes (Addendum)
[facility] LCSW Group Therapy Note  11/19/2019    Type of Therapy and Topic:  Group Therapy:  A Hero Worthy of Support  Participation Level:  Active   Description of Group:  Patients in this group were introduced to the concept that additional supports including self-support are an essential part of recovery.  Matching needs with supports to help fulfill those needs was explained.  Establishing boundaries that can gradually be increased or decreased was described, with patients giving their own examples of establishing appropriate boundaries in their lives.  A song entitled "My Own Hero" was played and a group discussion ensued in which patients stated it inspired them to help themselves in order to succeed, because other people cannot achieve their goals such as sobriety or stability for them.  A song was played called "I Am Enough" which led to a discussion about being willing to believe we are worth the effort of being a self-support.   Therapeutic Goals: 1)  demonstrate the importance of being a key part of one's own support system 2)  discuss various available supports 3)  encourage patient to use music as part of their self-support and focus on goals 4)  elicit ideas from patients about supports that need to be added   Summary of Patient Progress:  The patient expressed that his family is healthy for him, although he also stated they can enable him at times.  He said his sister specifically is unhealthy.  He talked about his fear that the family will stop being supportive if he cannot stop drinking.  He said "I know I need to really cut back."  He listened and responded well throughout group.  He referenced his girlfriend of 4 years pulling back from him, and was able to understand that she also has to take care of herself/be her own hero by establishing healthy boundaries to protect herself.  Therapeutic Modalities:   Motivational Interviewing Activity  Lynnell Chad

## 2019-11-19 NOTE — Progress Notes (Signed)
Pt is worried about what is going to happen once he is discharged. Pt thinks that upon discharge he will be tempted to go to the store to buy alcohol. Pt is interested in starting medication that will keep him away from alcohol. Encouraged pt to discuss this with the doctor in the AM. Pt shares that prior to his admission, he didn't think his medications were working and that he was upset about this. Pt said that his psychiatrist had prescribed him medications that caused him to have a lot of energy. Instead he wanted something to help "calm me down." His main stressor is his job. 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 Pt said that his supervisor likes to pick on him and curses at him. He quit his other job but is planning to see if he can get his position back. Pt denies SI/HI and AVH. Active listening, reassurance, and support provided. Medications administered as ordered by MD. Q 15 min safety checks continue. Pt's safety has been maintained.   11/18/19 2005  Psych Admission Type (Psych Patients Only)  Admission Status Involuntary  Psychosocial Assessment  Patient Complaints Anxiety;Appetite decrease;Depression;Sadness;Worrying  Eye Contact Brief;Avoids  Facial Expression Anxious  Affect Anxious;Depressed;Sad  Speech Logical/coherent  Interaction Assertive  Motor Activity Slow;Fidgety  Appearance/Hygiene Unremarkable  Behavior Characteristics Cooperative;Anxious;Fidgety;Pacing  Mood Depressed;Anxious;Sad;Pleasant  Thought Process  Coherency Circumstantial  Content WDL  Delusions None reported or observed  Perception WDL  Hallucination None reported or observed  Judgment Poor  Confusion None  Danger to Self  Current suicidal ideation? Denies  Danger to Others  Danger to Others None reported or observed

## 2019-11-19 NOTE — Progress Notes (Addendum)
[facility] MD Progress Note  11/19/2019 5:20 PM Thomas [name]  MRN:  [id number] Subjective: Patient is a 29 year old male who appears his stated age. He lives with his mother and grandmother. Currently he is between jobs. He appears very anxious, leg bouncing and fidgeting.  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 Discussed the likelihood of this being a response to anxiety. He has some hand tremors, likely from alcohol withdrawal and anxiety. He stated he feels better today, less anxious. He stated he slept well last night but woke up in a sweat. Patient believes it was a panic attack, likely it is from alcohol withdrawal. Patient stated he has always had panic attacks and when he was put on the other medication before coming to the hospital (low dose of Zoloft) he felt even worse and had too much energy, he felt like he could not stop his mind from thinking.  His concentration is fair.   Today on evaluation patient stated he feels better today. He appears less anxious, with fine tremors to bilateral hands. He has no complaints of N,V,diarrhea, sweating, palpitations or confusion. He appears better today. He is anxious to be discharged. He wants medication to help with alcohol cravings. Will start Naltrexone. He stated he has talked to his mother and she said he will not be allowed to drink in her house or even on the property. He would like to be referred to outpatient therapy. He denies suicidal and homicidal ideation, denies paranoia and hallucinations.   Patient is doing well with his current treatment and could possibly discharge on 11/15.     Principal Problem: Severe episode of recurrent major depressive disorder, without psychotic features (HCC) Diagnosis: Principal Problem:   Severe episode of recurrent major depressive disorder, without psychotic features (HCC) Active Problems:   Generalized anxiety disorder   Insomnia due to anxiety and fear  Total  Time spent with patient: 35 minutes  Past Psychiatric History: Patient was seen approximately 1 month prior.  At that time he was placed on low-dose Zoloft.  Past Medical History:  Past Medical History:  Diagnosis Date  . Anxiety     Past Surgical History:  Procedure Laterality Date  . APPENDECTOMY     Family History: History reviewed. No pertinent family history. Family Psychiatric  History: See H&P Social History:  Social History   Substance and Sexual Activity  Alcohol Use Yes   Comment: reported 20+ beers/day     Social History   Substance and Sexual Activity  Drug Use No    Social History   Socioeconomic History  . Marital status: Single    Spouse name: Not on file  . Number of children: Not on file  . Years of education: Not on file  . Highest education level: Not on file  Occupational History  . Not on file  Tobacco Use  . Smoking status: Never Smoker  . Smokeless tobacco: Never Used  Vaping Use  . Vaping Use: Never used  Substance and Sexual Activity  . Alcohol use: Yes    Comment: reported 20+ beers/day  . Drug use: No  . Sexual activity: Yes  Other Topics Concern  . Not on file  Social History Narrative  . Not on file   Social Determinants of Health   Financial Resource Strain:   . Difficulty of Paying Living Expenses: Not on file  Food Insecurity:   . Worried About  Running Out of Food in the Last Year: Not on file  . Ran Out of Food in the Last Year: Not on file  Transportation Needs:   . Lack of Transportation (Medical): Not on file  . Lack of Transportation (Non-Medical): Not on file  Physical Activity:   . Days of Exercise per Week: Not on file  . Minutes of Exercise per Session: Not on file  Stress:   . Feeling of Stress : Not on file  Social Connections:   . Frequency of Communication with Friends and Family: Not on file  . Frequency of Social Gatherings with Friends and Family: Not on file  . Attends Religious Services: Not on  file  . Active Member of Clubs or Organizations: Not on file  . Attends Banker Meetings: Not on file  . Marital Status: Not on file   Additional Social History:                         Sleep: Fair  Appetite:  Fair  Current Medications: Current Facility-Administered Medications  Medication Dose Route Frequency Provider Last Rate Last Admin  . acetaminophen (TYLENOL) tablet 650 mg  650 mg Oral Q6H PRN [name], [name], PA      . alum & mag hydroxide-simeth (MAALOX/MYLANTA) 200-200-20 MG/5ML suspension 30 mL  30 mL Oral Q4H PRN [name], [name], PA      . clonazePAM (KLONOPIN) tablet 1 mg  1 mg Oral TID [name], [name], MD   1 mg at 11/19/19 1232  . folic acid (FOLVITE) tablet 1 mg  1 mg Oral Daily [name], [name], MD   1 mg at 11/19/19 0803  . hydrOXYzine (ATARAX/VISTARIL) tablet 25 mg  25 mg Oral TID PRN [name], [name], PA   25 mg at 11/18/19 2114  . LORazepam (ATIVAN) tablet 1-4 mg  1-4 mg Oral Q1H PRN [name], [name], MD      . magnesium hydroxide (MILK OF MAGNESIA) suspension 30 mL  30 mL Oral Daily PRN [name], [name], PA      . multivitamin with minerals tablet 1 tablet  1 tablet Oral Daily [name], [name], MD   1 tablet at 11/19/19 0803  . naltrexone (DEPADE) tablet 50 mg  50 mg Oral Daily [name], NP   50 mg at 11/19/19 1232  . QUEtiapine (SEROQUEL) tablet 100 mg  100 mg Oral QHS [name], [name], MD   100 mg at 11/18/19 2114  . sertraline (ZOLOFT) tablet 100 mg  100 mg Oral Daily [name], [name], MD   100 mg at 11/19/19 0803  . thiamine tablet 100 mg  100 mg Oral Daily [name], [name], MD   100 mg at 11/19/19 0803   Or  . thiamine (B-1) injection 100 mg  100 mg Intramuscular Daily [name], [name], MD      . traZODone (DESYREL) tablet 100 mg  100 mg Oral QHS PRN [name], [name], PA   100 mg at 11/18/19 2113    Lab Results:  Results for orders placed or performed during the hospital encounter of 11/17/19 (from the  past 48 hour(s))  Comprehensive metabolic panel     Status: Abnormal   Collection Time: 11/17/19  5:54 PM  Result Value Ref Range   Sodium 130 (L) 135 - 145 mmol/L   Potassium 3.4 (L) 3.5 - 5.1 mmol/L   Chloride 89 (L) 98 - 111 mmol/L  CO2 26 22 - 32 mmol/L   Glucose, Bld 166 (H) 70 - 99 mg/dL    Comment: Glucose reference range applies only to samples taken after fasting for at least 8 hours.   BUN 13 6 - 20 mg/dL   Creatinine, Ser 5.80 0.61 - 1.24 mg/dL   Calcium 9.8 8.9 - 99.8 mg/dL   Total Protein 8.6 (H) 6.5 - 8.1 g/dL   Albumin 4.9 3.5 - 5.0 g/dL   AST 338 (H) 15 - 41 U/L   ALT 247 (H) 0 - 44 U/L   Alkaline Phosphatase 83 38 - 126 U/L   Total Bilirubin 1.4 (H) 0.3 - 1.2 mg/dL   GFR, Estimated >25 >05 mL/min    Comment: (NOTE) Calculated using the CKD-EPI Creatinine Equation (2021)    Anion gap 15 5 - 15    Comment: Performed at [facility], [address] [place], [place] [address]  Magnesium     Status: None   Collection Time: 11/17/19  5:54 PM  Result Value Ref Range   Magnesium 2.2 1.7 - 2.4 mg/dL    Comment: Performed at [facility], [address], [place], [place] [address]  Phosphorus     Status: None   Collection Time: 11/17/19  5:54 PM  Result Value Ref Range   Phosphorus 2.7 2.5 - 4.6 mg/dL    Comment: Performed at [facility], [address], [place], [place] [address]  CBC     Status: None   Collection Time: 11/17/19  5:54 PM  Result Value Ref Range   WBC 7.2 4.0 - 10.5 K/uL   RBC 5.04 4.22 - 5.81 MIL/uL   Hemoglobin 16.2 13.0 - 17.0 g/dL   HCT 09.7 39 - 52 %   MCV 90.9 80.0 - 100.0 fL   MCH 32.1 26.0 - 34.0 pg   MCHC 35.4 30.0 - 36.0 g/dL   RDW 35.3 29.9 - 24.2 %   Platelets 247 150 - 400 K/uL   nRBC 0.0 0.0 - 0.2 %    Comment: Performed at [facility], [address], [place], [place] [address]  Hemoglobin A1c     Status: None   Collection Time: 11/18/19  8:28 AM   Result Value Ref Range   Hgb A1c MFr Bld 5.1 4.8 - 5.6 %    Comment: (NOTE) Pre diabetes:          5.7%-6.4%  Diabetes:              >6.4%  Glycemic control for   <7.0% adults with diabetes    Mean Plasma Glucose 99.67 mg/dL    Comment: Performed at [facility], [address], [place], [place] [address]  Lipid panel     Status: Abnormal   Collection Time: 11/18/19  8:28 AM  Result Value Ref Range   Cholesterol 255 (H) 0 - 200 mg/dL   Triglycerides 979 <892 mg/dL   HDL 90 >11 mg/dL   Total CHOL/HDL Ratio 2.8 RATIO   VLDL 21 0 - 40 mg/dL   LDL Cholesterol 941 (H) 0 - 99 mg/dL    Comment:        Total Cholesterol/HDL:CHD Risk Coronary Heart Disease Risk Table                     Men   Women  1/2 Average Risk   3.4   3.3  Average Risk  5.0   4.4  2 X Average Risk   9.6   7.1  3 X Average Risk  23.4   11.0        Use the calculated Patient Ratio above and the CHD Risk Table to determine the patient's CHD Risk.        ATP III CLASSIFICATION (LDL):  <100     mg/dL   Optimal  263-785  mg/dL   Near or Above                    Optimal  130-159  mg/dL   Borderline  885-027  mg/dL   High  >741     mg/dL   Very High Performed at [facility], [address] [place], [place] [address]   TSH     Status: None   Collection Time: 11/18/19  8:28 AM  Result Value Ref Range   TSH 1.422 0.350 - 4.500 uIU/mL    Comment: Performed by a 3rd Generation assay with a functional sensitivity of <=0.01 uIU/mL. Performed at [facility], [address] [place], [place] [address]     Blood Alcohol level:  Lab Results  Component Value Date   ETH 190 (H) 11/16/2019    Metabolic Disorder Labs: Lab Results  Component Value Date   HGBA1C 5.1 11/18/2019   MPG 99.67 11/18/2019   No results found for: PROLACTIN Lab Results  Component Value Date   CHOL 255 (H) 11/18/2019   TRIG 107 11/18/2019   HDL 90 11/18/2019   CHOLHDL 2.8  11/18/2019   VLDL 21 11/18/2019   LDLCALC 144 (H) 11/18/2019    Physical Findings: AIMS: Facial and Oral Movements Muscles of Facial Expression: None, normal Lips and Perioral Area: None, normal Jaw: None, normal Tongue: None, normal,Extremity Movements Upper (arms, wrists, hands, fingers): None, normal Lower (legs, knees, ankles, toes): None, normal, Trunk Movements Neck, shoulders, hips: None, normal, Overall Severity Severity of abnormal movements (highest score from questions above): None, normal Incapacitation due to abnormal movements: None, normal Patient's awareness of abnormal movements (rate only patient's report): No Awareness, Dental Status Current problems with teeth and/or dentures?: No Does patient usually wear dentures?: No  CIWA:  CIWA-Ar Total: 3 COWS:     Musculoskeletal: Strength & Muscle Tone: within normal limits Gait & Station: normal Patient leans: N/A  Psychiatric Specialty Exam: Physical Exam: Chart reviewed, labs reviewed. No overnight events  Review of Systems: Within normal limits except for that which is mentioned in the HPI  Blood pressure 124/69, pulse (!) 130, temperature 98.7 F (37.1 C), temperature source Oral, resp. rate 18, height 5\' 11"  (1.803 m), weight 72.6 kg, SpO2 96 %.Body mass index is 22.32 kg/m.  General Appearance: Casual and Fairly Groomed  Eye Contact:  Good  Speech:  Clear and Coherent and Normal Rate  Volume:  Normal  Mood:  Anxious and Depressed  Affect:  Appropriate and Congruent  Thought Process:  Coherent, Goal Directed, Linear and Descriptions of Associations: Intact  Orientation:  Full (Time, Place, and Person)  Thought Content:  Logical and Hallucinations: None  Suicidal Thoughts:  Denies  Homicidal Thoughts:  No  Memory:  Immediate;   Good Recent;   Good Remote;   Fair  Judgement:  Fair  Insight:  Fair  Psychomotor Activity:  Restlessness and Tremor  Concentration:  Concentration: Fair and Attention Span:  Fair  Recall:  Fair  Fund of Knowledge:  Good  Language:  Good  Akathisia:  No  Handed:  Right  AIMS (if indicated):     Assets:  Communication Skills Desire for Improvement Financial Resources/Insurance Housing Resilience Social Support Talents/Skills Transportation Vocational/Educational  ADL's:  Intact  Cognition:  WNL  Sleep:  Number of Hours: 6.75     Treatment Plan Summary: Daily contact with patient to assess and evaluate symptoms and progress in treatment, Medication management and Plan medication managment   Problem #1  Anxiety -Continue Zoloft 100 mg PO daily -Continue Klonopin 1 mg PO TID -Continue Seroquel 100 mg PO at bedtime  Problem #2 Alcohol Use Disorder:  -Continue Ativan CIWA protocol -Continue Folic Acid 7857m PO daily -Continue Thiamine 100 mg PO daily -Add Naltrexone 50 mg PO daily    Laveda AbbeLaurie Britton Parks, NP 11/19/2019, 5:20 PM  I have reviewed the note by NP, and discussed the plan of care.  I am in agreement with the assessment and plan.  Mariel CraftSHEILA M Tahje Borawski, MD   Laveda AbbeLaurie Britton Parks, NP 11/19/2019, 5:20 PM

## 2019-11-19 NOTE — Progress Notes (Signed)
[place] NOVEL CORONAVIRUS (COVID-19) DAILY CHECK-OFF SYMPTOMS - answer yes or no to each - every day NO YES  Have you had a fever in the past 24 hours?  . Fever (Temp > 37.80C / 100F) X   Have you had any of these symptoms in the past 24 hours? . New Cough .  Sore Throat  .  Shortness of Breath .  Difficulty Breathing .  Unexplained Body Aches   X   Have you had any one of these symptoms in the past 24 hours not related to allergies?   . Runny Nose .  Nasal Congestion .  Sneezing   X   If you have had runny nose, nasal congestion, sneezing in the past 24 hours, has it worsened?  X   EXPOSURES - check yes or no X   Have you traveled outside the state in the past 14 days?  X   Have you been in contact with someone with a confirmed diagnosis of COVID-19 or PUI in the past 14 days without wearing appropriate PPE?  X   Have you been living in the same home as a person with confirmed diagnosis of COVID-19 or a PUI (household contact)?    X   Have you been diagnosed with COVID-19?    X              What to do next: Answered NO to all: Answered YES to anything:   Proceed with unit schedule Follow the [facility] Inpatient Flowsheet.   

## 2019-11-19 NOTE — Progress Notes (Addendum)
   11/19/19 1400  Psych Admission Type (Psych Patients Only)  Admission Status Involuntary  Psychosocial Assessment  Patient Complaints Appetite decrease  Eye Contact Brief  Facial Expression Anxious  Affect Anxious;Depressed;Sad  Speech Logical/coherent  Interaction Assertive  Motor Activity Slow;Fidgety  Appearance/Hygiene Unremarkable  Behavior Characteristics Cooperative;Anxious  Mood Depressed;Anxious  Aggressive Behavior  Effect No apparent injury  Thought Process  Coherency Circumstantial  Content WDL  Delusions None reported or observed  Perception WDL  Hallucination None reported or observed  Judgment Poor  Confusion None  Danger to Self  Current suicidal ideation? Denies  Danger to Others  Danger to Others None reported or observed    D. Pt continues to present as anxious upon approach, and rates his depression, hopelessness and anxiety all 2's on his self inventory. Pt currently denies pain, SI/HI and AVH. Pt states that "medication is working well and not having withdrawals for last two days". Pt observed in the dayroom this afternoon attending groups. A. Labs and vitals monitored. Pt given and educated on medications. Pt supported emotionally and encouraged to express concerns and ask questions.   R. Pt remains safe with 15 minute checks. Will continue POC.

## 2019-11-20 DIAGNOSIS — F333 Major depressive disorder, recurrent, severe with psychotic symptoms: Secondary | ICD-10-CM | POA: Diagnosis not present

## 2019-11-20 NOTE — Progress Notes (Signed)
DAR NOTE: Patient presents with a flat affect and depressed mood.  Denies suicidal thoughts, pain, auditory and visual hallucinations.  Described energy level as normal with good concentration.  Rates depression at 2, hopelessness at 2, and anxiety at 1.  Maintained on routine safety checks.  Medications given as prescribed.  Support and encouragement offered as needed.  Attended group and participated.  States goal for today is "maybe eat more."  Patient visible in milieu with minimal interaction.  Patient is safe on and off the unit.  Offered no complaint.

## 2019-11-20 NOTE — Progress Notes (Signed)
Recreation Therapy Notes  Date:  1.22.20 Time: 0930 Location: [address] Dayroom  Group Topic: Stress Management  Goal Area(s) Addresses:  Patient will identify positive stress management techniques. Patient will identify benefits of using stress management post d/c.  Intervention: Stress Management  Activity: Meditation.  LRT played a meditation that focused on making choices.  Patients were to listen and follow as meditation played to engage in activity.    Education:  Stress Management, Discharge Planning.   Education Outcome: Acknowledges Education  Clinical Observations/Feedback: Pt did not attend activity.    Caroll Rancher, LRT/CTRS         Caroll Rancher A 11/20/2019 11:33 AM

## 2019-11-20 NOTE — [facility] Group Notes (Signed)
[facility] LCSW Group Therapy  11/20/2019 1:54 PM  Type of Therapy:  ABC's of CBT and Triggers  Participation Level:  Active  Participation Quality:  Appropriate  Affect:  Appropriate  Cognitive:  Appropriate  Insight:  Developing/Improving  Engagement in Therapy:  Developing/Improving  Modes of Intervention:  Discussion and Education  Summary of Progress/Problems: Thomas [name] states that he likes to play sports to help him manage and cope with triggers.  Thomas [name] attended group and remained there the entire time.  Thomas [name] accepted the handouts that were provided during the group.   [name] 11/20/2019, 1:54 PM

## 2019-11-20 NOTE — [facility] Group Notes (Signed)
The focus of this group is to help patients establish daily goals to achieve during treatment and discuss how the patient can incorporate goal setting into their daily lives to aide in recovery.  Pt did not attend group 

## 2019-11-20 NOTE — [facility] Group Notes (Addendum)
Occupational Therapy Group Note Date: 11/20/2019 Group Topic/Focus: Stress Management  Group Description: Group encouraged increased participation and engagement through discussion focused on topic of stress management. Patients engaged interactively to discuss components of stress including physical signs, emotional signs, negative management strategies, and positive management strategies. Each individual identified one new stress management strategy they would like to try moving forward.    Therapeutic Goals: Identify current stressors Identify healthy vs unhealthy stress management strategies/techniques Discuss and identify physical and emotional signs of stress Participation Level: Minimal   Participation Quality: Moderate Cues   Behavior: Guarded   Speech/Thought Process: Focused   Affect/Mood: Constricted   Insight: Fair   Judgement: Fair   Individualization: Herbert was minimally engaged in their participation of group discussion and activity. Appeared receptive and attentive to education and strategies suggested by other peers.  Modes of Intervention: Activity, Discussion and Education  Patient Response to Interventions:  Attentive, Engaged, Receptive and Interested   Plan: Continue to engage patient in OT groups 2 - 3x/week.   11/20/2019  Donne Hazel, MOT, OTR/L

## 2019-11-20 NOTE — Tx Team (Signed)
Interdisciplinary Treatment and Diagnostic Plan Update  11/20/2019 Time of Session: 9:15am  [name] MRN: [id number]  Principal Diagnosis: Severe episode of recurrent major depressive disorder, without psychotic features (HCC)  Secondary Diagnoses: Principal Problem:   Severe episode of recurrent major depressive disorder, without psychotic features (HCC) Active Problems:   Generalized anxiety disorder   Insomnia due to anxiety and fear   Current Medications:  Current Facility-Administered Medications  Medication Dose Route Frequency Provider Last Rate Last Admin  . acetaminophen (TYLENOL) tablet 650 mg  650 mg Oral Q6H PRN [name], [name], PA      . alum & mag hydroxide-simeth (MAALOX/MYLANTA) 200-200-20 MG/5ML suspension 30 mL  30 mL Oral Q4H PRN [name], [name], PA      . clonazePAM (KLONOPIN) tablet 1 mg  1 mg Oral TID [name], [name], MD   1 mg at 11/20/19 0900  . folic acid (FOLVITE) tablet 1 mg  1 mg Oral Daily [name], [name], MD   1 mg at 11/20/19 0900  . hydrOXYzine (ATARAX/VISTARIL) tablet 25 mg  25 mg Oral TID PRN [name], [name], PA   25 mg at 11/18/19 2114  . LORazepam (ATIVAN) tablet 1-4 mg  1-4 mg Oral Q1H PRN [name], [name], MD      . magnesium hydroxide (MILK OF MAGNESIA) suspension 30 mL  30 mL Oral Daily PRN [name], [name], PA      . multivitamin with minerals tablet 1 tablet  1 tablet Oral Daily [name], [name], MD   1 tablet at 11/20/19 0900  . naltrexone (DEPADE) tablet 50 mg  50 mg Oral Daily [name], NP   50 mg at 11/19/19 1232  . QUEtiapine (SEROQUEL) tablet 100 mg  100 mg Oral QHS [name], [name], MD   100 mg at 11/19/19 2105  . sertraline (ZOLOFT) tablet 100 mg  100 mg Oral Daily [name], [name], MD   100 mg at 11/20/19 0900  . thiamine tablet 100 mg  100 mg Oral Daily [name], [name], MD   100 mg at 11/20/19 0900   Or  . thiamine (B-1) injection 100 mg  100 mg Intramuscular Daily [name], [name], MD       . traZODone (DESYREL) tablet 100 mg  100 mg Oral QHS PRN [name], [name], PA   100 mg at 11/18/19 2113   PTA Medications: Medications Prior to Admission  Medication Sig Dispense Refill Last Dose  . gabapentin (NEURONTIN) 100 MG capsule Take 1 capsule (100 mg total) by mouth 3 (three) times daily. 90 capsule 2   . hydrOXYzine (ATARAX/VISTARIL) 25 MG tablet Take 1 tablet (25 mg total) by mouth 3 (three) times daily as needed. 90 tablet 2   . QUEtiapine (SEROQUEL) 50 MG tablet Take 1 tablet (50 mg total) by mouth at bedtime. 30 tablet 2   . sertraline (ZOLOFT) 50 MG tablet Take 1 tablet (50 mg total) by mouth daily. 30 tablet 2   . traZODone (DESYREL) 100 MG tablet Take 1 tablet (100 mg total) by mouth at bedtime as needed for sleep. 30 tablet 2     Patient Stressors: Educational concerns Financial difficulties Health problems Occupational concerns Substance abuse  Patient Strengths: Ability for insight Average or above average Environmental consultant Motivation for treatment/growth Supportive family/friends Work skills  Treatment Modalities: Medication Management, Group therapy, Case management,  1 to 1 session with clinician, Psychoeducation, Recreational therapy.   Physician Treatment Plan for Primary Diagnosis:  Severe episode of recurrent major depressive disorder, without psychotic features (HCC) Long Term Goal(s): Improvement in symptoms so as ready for discharge   Short Term Goals: Ability to identify changes in lifestyle to reduce recurrence of condition will improve Ability to verbalize feelings will improve Ability to disclose and discuss suicidal ideas Ability to demonstrate self-control will improve Ability to identify and develop effective coping behaviors will improve Ability to maintain clinical measurements within normal limits will improve Compliance with prescribed medications will improve Ability to identify triggers associated with substance  abuse/mental health issues will improve  Medication Management: Evaluate patient's response, side effects, and tolerance of medication regimen.  Therapeutic Interventions: 1 to 1 sessions, Unit Group sessions and Medication administration.  Evaluation of Outcomes: Progressing  Physician Treatment Plan for Secondary Diagnosis: Principal Problem:   Severe episode of recurrent major depressive disorder, without psychotic features (HCC) Active Problems:   Generalized anxiety disorder   Insomnia due to anxiety and fear  Long Term Goal(s): Improvement in symptoms so as ready for discharge   Short Term Goals: Ability to identify changes in lifestyle to reduce recurrence of condition will improve Ability to verbalize feelings will improve Ability to disclose and discuss suicidal ideas Ability to demonstrate self-control will improve Ability to identify and develop effective coping behaviors will improve Ability to maintain clinical measurements within normal limits will improve Compliance with prescribed medications will improve Ability to identify triggers associated with substance abuse/mental health issues will improve     Medication Management: Evaluate patient's response, side effects, and tolerance of medication regimen.  Therapeutic Interventions: 1 to 1 sessions, Unit Group sessions and Medication administration.  Evaluation of Outcomes: Progressing   RN Treatment Plan for Primary Diagnosis: Severe episode of recurrent major depressive disorder, without psychotic features (HCC) Long Term Goal(s): Knowledge of disease and therapeutic regimen to maintain health will improve  Short Term Goals: Ability to remain free from injury will improve, Ability to participate in decision making will improve, Ability to verbalize feelings will improve, Ability to disclose and discuss suicidal ideas, Ability to identify and develop effective coping behaviors will improve and Compliance with  prescribed medications will improve  Medication Management: RN will administer medications as ordered by provider, will assess and evaluate patient's response and provide education to patient for prescribed medication. RN will report any adverse and/or side effects to prescribing provider.  Therapeutic Interventions: 1 on 1 counseling sessions, Psychoeducation, Medication administration, Evaluate responses to treatment, Monitor vital signs and CBGs as ordered, Perform/monitor CIWA, COWS, AIMS and Fall Risk screenings as ordered, Perform wound care treatments as ordered.  Evaluation of Outcomes: Progressing   LCSW Treatment Plan for Primary Diagnosis: Severe episode of recurrent major depressive disorder, without psychotic features (HCC) Long Term Goal(s): Safe transition to appropriate next level of care at discharge, Engage patient in therapeutic group addressing interpersonal concerns.  Short Term Goals: Engage patient in aftercare planning with referrals and resources, Increase social support, Increase emotional regulation, Facilitate acceptance of mental health diagnosis and concerns, Identify triggers associated with mental health/substance abuse issues and Increase skills for wellness and recovery  Therapeutic Interventions: Assess for all discharge needs, 1 to 1 time with Social worker, Explore available resources and support systems, Assess for adequacy in community support network, Educate family and significant other(s) on suicide prevention, Complete Psychosocial Assessment, Interpersonal group therapy.  Evaluation of Outcomes: Progressing   Progress in Treatment: Attending groups: Yes. Participating in groups: Yes. Taking medication as prescribed: Yes. Toleration medication: Yes. Family/Significant  other contact made: Yes, individual(s) contacted:  Mother  Patient understands diagnosis: Yes. and No. Discussing patient identified problems/goals with staff: Yes. Medical problems  stabilized or resolved: Yes. Denies suicidal/homicidal ideation: Yes. Issues/concerns per patient self-inventory: No.   New problem(s) identified: No, Describe:  None  New Short Term/Long Term Goal(s): medication stabilization, elimination of SI thoughts, development of comprehensive mental wellness plan.   Patient Goals: "To go home"   Discharge Plan or Barriers: Patient recently admitted. CSW will continue to follow and assess for appropriate referrals and possible discharge planning.   Reason for Continuation of Hospitalization: Anxiety Medication stabilization Suicidal ideation  Estimated Length of Stay: 3 to 5 days   Attendees: Patient: Thomas [name]  11/20/2019   Physician: Pricilla Larsson, MD 11/20/2019   Nursing:  11/20/2019   RN Care Manager: 11/20/2019   Social Worker: Melba Coon, LCSWA 11/20/2019   Recreational Therapist:  11/20/2019   Other:  11/20/2019   Other:  11/20/2019   Other: 11/20/2019     Scribe for Treatment Team: Aram Beecham, LCSWA 11/20/2019 10:01 AM

## 2019-11-20 NOTE — Progress Notes (Signed)
Adult Psychoeducational Group Note  Date:  11/20/2019 Time:  3:19 AM  Group Topic/Focus:  Wrap-Up Group:   The focus of this group is to help patients review their daily goal of treatment and discuss progress on daily workbooks.  Participation Level:  Active  Participation Quality:  Appropriate  Affect:  Appropriate  Cognitive:  Appropriate  Insight: Appropriate  Engagement in Group:  Engaged  Modes of Intervention:  Discussion  Additional Comments:  Pt attend wrap up group. His day was a 9. The one positive thing that happen he had no anxiety today.  Charna Busman Long 11/20/2019, 3:19 AM
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
## 2019-11-20 NOTE — Progress Notes (Signed)
°   11/20/19 2200  Psych Admission Type (Psych Patients Only)  Admission Status Involuntary  Psychosocial Assessment  Patient Complaints Depression  Eye Contact Brief  Facial Expression Anxious  Affect Anxious;Depressed;Sad  Speech Logical/coherent  Interaction Assertive  Motor Activity Slow;Fidgety  Appearance/Hygiene Unremarkable  Behavior Characteristics Cooperative  Mood Depressed  Aggressive Behavior  Effect No apparent injury  Thought Process  Coherency Circumstantial  Content WDL  Delusions None reported or observed  Perception WDL  Hallucination None reported or observed  Judgment Poor  Confusion None  Danger to Self  Current suicidal ideation? Denies  Danger to Others  Danger to Others None reported or observed

## 2019-11-20 NOTE — Progress Notes (Signed)
Patient ID: Thomas [name], male   DOB: 12/28/1990, 29 y.o.   MRN: [id number]   [facility] MD Progress Note  11/20/2019 3:05 PM LAB[facility] CWYNAR  MRN:  [id number]   Subjective: Patient seen, chart reviewed, case discussed with treatment team.  Patient continues to report improvement in memory.  He states that the medication is helping him feel less anxious.  He denies any panic-like symptoms in the last 24 hours.  He states that he is sleeping better and feels better able to concentrate on tasks instead of being distracted by racing thoughts.  He continues to deny any suicidal ideation.  Does report occasional anxiety.  Pulse rate remain elevated today at 115, blood pressure is beginning to stabilize.  We will anticipate discharge tomorrow for this patient     Principal Problem: Severe episode of recurrent major depressive disorder, without psychotic features (HCC) Diagnosis: Principal Problem:   Severe episode of recurrent major depressive disorder, without psychotic features (HCC) Active Problems:   Generalized anxiety disorder   Insomnia due to anxiety and fear  Total Time spent with patient: 35 minutes  Past Psychiatric History: Patient was seen approximately 1 month prior.  At that time he was placed on low-dose Zoloft.  Past Medical History:  Past Medical History:  Diagnosis Date  . Anxiety     Past Surgical History:  Procedure Laterality Date  . APPENDECTOMY     Family History: History reviewed. No pertinent family history. Family Psychiatric  History: See H&P Social History:  Social History   Substance and Sexual Activity  Alcohol Use Yes   Comment: reported 20+ beers/day     Social History   Substance and Sexual Activity  Drug Use No    Social History   Socioeconomic History  . Marital status: Single    Spouse name: Not on file  . Number of children: Not on file  . Years of education: Not on file  . Highest education level: Not on file  Occupational  History  . Not on file  Tobacco Use  . Smoking status: Never Smoker  . Smokeless tobacco: Never Used  Vaping Use  . Vaping Use: Never used  Substance and Sexual Activity  . Alcohol use: Yes    Comment: reported 20+ beers/day  . Drug use: No  . Sexual activity: Yes  Other Topics Concern  . Not on file  Social History Narrative  . Not on file   Social Determinants of Health   Financial Resource Strain:   . Difficulty of Paying Living Expenses: Not on file  Food Insecurity:   . Worried About Programme researcher, broadcasting/film/video in the Last Year: Not on file  . Ran Out of Food in the Last Year: Not on file  Transportation Needs:   . Lack of Transportation (Medical): Not on file  . Lack of Transportation (Non-Medical): Not on file  Physical Activity:   . Days of Exercise per Week: Not on file  . Minutes of Exercise per Session: Not on file  Stress:   . Feeling of Stress : Not on file  Social Connections:   . Frequency of Communication with Friends and Family: Not on file  . Frequency of Social Gatherings with Friends and Family: Not on file  . Attends Religious Services: Not on file  . Active Member of Clubs or Organizations: Not on file  . Attends Banker Meetings: Not on file  . Marital Status: Not on file   Additional Social  History:                         Sleep: Fair  Appetite:  Fair  Current Medications: Current Facility-Administered Medications  Medication Dose Route Frequency Provider Last Rate Last Admin  . acetaminophen (TYLENOL) tablet 650 mg  650 mg Oral Q6H PRN [name], [name], PA      . alum & mag hydroxide-simeth (MAALOX/MYLANTA) 200-200-20 MG/5ML suspension 30 mL  30 mL Oral Q4H PRN [name], [name], PA      . clonazePAM (KLONOPIN) tablet 1 mg  1 mg Oral TID Ron Beske, [name], MD   1 mg at 11/20/19 1204  . folic acid (FOLVITE) tablet 1 mg  1 mg Oral Daily Cailah Reach A, MD   1 mg at 11/20/19 0900  . hydrOXYzine (ATARAX/VISTARIL) tablet 25  mg  25 mg Oral TID PRN [name], [name], PA   25 mg at 11/18/19 2114  . magnesium hydroxide (MILK OF MAGNESIA) suspension 30 mL  30 mL Oral Daily PRN [name], [name], PA      . multivitamin with minerals tablet 1 tablet  1 tablet Oral Daily Maryrose Colvin, [name], MD   1 tablet at 11/20/19 0900  . naltrexone (DEPADE) tablet 50 mg  50 mg Oral Daily [name], NP   50 mg at 11/20/19 1340  . QUEtiapine (SEROQUEL) tablet 100 mg  100 mg Oral QHS Govind Furey, [name], MD   100 mg at 11/19/19 2105  . sertraline (ZOLOFT) tablet 100 mg  100 mg Oral Daily Clark Cuff A, MD   100 mg at 11/20/19 0900  . thiamine tablet 100 mg  100 mg Oral Daily Jakiah Goree A, MD   100 mg at 11/20/19 0900   Or  . thiamine (B-1) injection 100 mg  100 mg Intramuscular Daily Yaniris Braddock A, MD      . traZODone (DESYREL) tablet 100 mg  100 mg Oral QHS PRN [name], [name], PA   100 mg at 11/18/19 2113    Lab Results:  No results found for this or any previous visit (from the past 48 hour(s)).  Blood Alcohol level:  Lab Results  Component Value Date   ETH 190 (H) 11/16/2019    Metabolic Disorder Labs: Lab Results  Component Value Date   HGBA1C 5.1 11/18/2019   MPG 99.67 11/18/2019   No results found for: PROLACTIN Lab Results  Component Value Date   CHOL 255 (H) 11/18/2019   TRIG 107 11/18/2019   HDL 90 11/18/2019   CHOLHDL 2.8 11/18/2019   VLDL 21 11/18/2019   LDLCALC 144 (H) 11/18/2019    Physical Findings: AIMS: Facial and Oral Movements Muscles of Facial Expression: None, normal Lips and Perioral Area: None, normal Jaw: None, normal Tongue: None, normal,Extremity Movements Upper (arms, wrists, hands, fingers): None, normal Lower (legs, knees, ankles, toes): None, normal, Trunk Movements Neck, shoulders, hips: None, normal, Overall Severity Severity of abnormal movements (highest score from questions above): None, normal Incapacitation due to abnormal movements: None,  normal Patient's awareness of abnormal movements (rate only patient's report): No Awareness, Dental Status Current problems with teeth and/or dentures?: No Does patient usually wear dentures?: No  CIWA:  CIWA-Ar Total: 3 COWS:     Musculoskeletal: Strength & Muscle Tone: within normal limits Gait & Station: normal Patient leans: N/A  Psychiatric Specialty Exam: Physical Exam: Chart reviewed, labs reviewed. No overnight events  Review of Systems: Within normal limits except  for that which is mentioned in the HPI  Blood pressure 115/82, pulse (!) 115, temperature 99 F (37.2 C), temperature source Oral, resp. rate 18, height 5\' 11"  (1.803 m), weight 72.6 kg, SpO2 96 %.Body mass index is 22.32 kg/m.  General Appearance: Casual and Fairly Groomed  Eye Contact:  Good  Speech:  Clear and Coherent and Normal Rate  Volume:  Normal  Mood:  Anxious and Depressed  Affect:  Appropriate and Congruent  Thought Process:  Coherent, Goal Directed, Linear and Descriptions of Associations: Intact  Orientation:  Full (Time, Place, and Person)  Thought Content:  Logical  Suicidal Thoughts:  Denies  Homicidal Thoughts:  No  Memory:  Immediate;   Good Recent;   Good Remote;   Fair  Judgement:  Fair  Insight:  Fair  Psychomotor Activity:  Restlessness and Tremor  Concentration:  Concentration: Fair and Attention Span: Fair  Recall:  Fair  Fund of Knowledge:  Good  Language:  Good  Akathisia:  No  Handed:  Right  AIMS (if indicated):     Assets:  Communication Skills Desire for Improvement Financial Resources/Insurance Housing Resilience Social Support Talents/Skills Transportation Vocational/Educational  ADL's:  Intact  Cognition:  WNL  Sleep:  Number of Hours: 6.75     Treatment Plan Summary: Daily contact with patient to assess and evaluate symptoms and progress in treatment, Medication management and Plan medication managment   Problem #1  Anxiety -Continue Zoloft 100 mg PO  daily -Continue Klonopin 1 mg PO TID -Continue Seroquel 100 mg PO at bedtime  Problem #2 Alcohol Use Disorder:  -Continue Ativan CIWA protocol -Continue Folic Acid 68m PO daily -Continue Thiamine 100 mg PO daily -Continue Naltrexone 50 mg PO daily      0m, MD 11/20/2019, 3:05 PM

## 2019-11-21 ENCOUNTER — Other Ambulatory Visit (HOSPITAL_COMMUNITY): Payer: Self-pay | Admitting: Psychiatric/Mental Health

## 2019-11-21 MED ORDER — SERTRALINE HCL 100 MG PO TABS: 100.0000 mg | ORAL_TABLET | Freq: Every day | ORAL | 0 refills | Status: DC

## 2019-11-21 MED ORDER — TRAZODONE HCL 100 MG PO TABS: 100.0000 mg | ORAL_TABLET | Freq: Every evening | ORAL | 0 refills | Status: DC | PRN

## 2019-11-21 MED ORDER — NALTREXONE HCL 50 MG PO TABS: 50.0000 mg | ORAL_TABLET | Freq: Every day | ORAL | 0 refills | Status: DC

## 2019-11-21 MED ORDER — QUETIAPINE FUMARATE 100 MG PO TABS: 100.0000 mg | ORAL_TABLET | Freq: Every day | ORAL | 0 refills | Status: DC

## 2019-11-21 MED ORDER — HYDROXYZINE HCL 25 MG PO TABS: 25.0000 mg | ORAL_TABLET | Freq: Three times a day (TID) | ORAL | 0 refills | Status: DC | PRN

## 2019-11-21 MED ORDER — CLONAZEPAM 1 MG PO TABS: 1.0000 mg | ORAL_TABLET | Freq: Three times a day (TID) | ORAL | 0 refills | Status: DC

## 2019-11-21 NOTE — Progress Notes (Signed)
  [facility] Adult Case Management Discharge Plan :  Will you be returning to the same living situation after discharge:  Yes,  home with mom.  At discharge, do you have transportation home?: Yes,  with mom. Do you have the ability to pay for your medications: Yes,  is employed  Release of information consent forms completed and in the chart;  Patient's signature needed at discharge.  Patient to Follow up at:  Follow-up Information    [facility]. Go on 11/27/2019.   Specialty: Behavioral Health Why: You have an appointment for substance abuse intensive outpatient therapy services on 12/13/19 at 3:00 pm. You also have an appointment for medication management services on 11/27/19 at 9:00 am. These appointments will be held in person. Contact information: [phone] [place] [address] [phone]              Next level of care provider has access to [facility] Link:yes  Safety Planning and Suicide Prevention discussed: Yes,  Colsen Modi (mom) [phone]''     Has patient been referred to the Quitline?: N/A patient is not a smoker  Patient has been referred for addiction treatment: Yes  Felizardo Hoffmann, LCSWA 11/21/2019, 9:42 AM

## 2019-11-21 NOTE — BHH Suicide Risk Assessment (Signed)
[facility] Discharge Suicide Risk Assessment   Principal Problem: Severe episode of recurrent major depressive disorder, without psychotic features (HCC) Discharge Diagnoses: Principal Problem:   Severe episode of recurrent major depressive disorder, without psychotic features (HCC) Active Problems:   Generalized anxiety disorder   Insomnia due to anxiety and fear   Total Time spent with patient: 20 minutes  Musculoskeletal: Strength & Muscle Tone: within normal limits Gait & Station: normal Patient leans: N/A  Psychiatric Specialty Exam: Review of Systems  All other systems reviewed and are negative.   Blood pressure 125/83, pulse 94, temperature 97.8 F (36.6 C), temperature source Oral, resp. rate 18, height 5\' 11"  (1.803 m), weight 72.6 kg, SpO2 96 %.Body mass index is 22.32 kg/m.  General Appearance: Casual  Eye Contact::  Good  Speech:  Clear and Coherent  Volume:  Normal  Mood:  Euthymic  Affect:  Appropriate  Thought Process:  Coherent  Orientation:  Full (Time, Place, and Person)  Thought Content:  Logical  Suicidal Thoughts:  No  Homicidal Thoughts:  No  Memory:  Recent;   Fair  Judgement:  Fair  Insight:  Fair  Psychomotor Activity:  Normal  Concentration:  Fair  Recall:  [id number] of Knowledge:Fair  Language: Fair  Akathisia:  No  Handed:  Right  AIMS (if indicated):     Assets:  Communication Skills Desire for Improvement Housing Leisure Time Physical Health Resilience Social Support  Sleep:  Number of Hours: 6.25  Cognition: WNL  ADL's:  Intact   Mental Status Per Nursing Assessment::   On Admission:  Suicidal ideation indicated by patient, Suicidal ideation indicated by others, Intention to act on suicide plan, Suicide plan, Plan includes specific time, place, or method, Self-harm thoughts  Demographic Factors:  Male and Unemployed  Loss Factors: Decrease in vocational status and Decline in physical health  Historical  Factors: Impulsivity  Risk Reduction Factors:   Sense of responsibility to family, Religious beliefs about death, Living with another person, especially a relative, Positive social support, Positive therapeutic relationship and Positive coping skills or problem solving skills  Continued Clinical Symptoms:  Alcohol/Substance Abuse/Dependencies  Cognitive Features That Contribute To Risk:  None    Suicide Risk:  Minimal: No identifiable suicidal ideation.  Patients presenting with no risk factors but with morbid ruminations; may be classified as minimal risk based on the severity of the depressive symptoms   Follow-up Information    [facility]. Go on 11/27/2019.   Specialty: Behavioral Health Why: You have an appointment for substance abuse intensive outpatient therapy services on 12/13/19 at 3:00 pm. You also have an appointment for medication management services on 11/27/19 at 9:00 am. These appointments will be held in person. Contact information: [address] [place] [phone]              Plan Of Care/Follow-up recommendations:  Other:  Follow-up with outpatient psychiatric care  [phone], MD 11/21/2019, 9:52 AM

## 2019-11-21 NOTE — Discharge Summary (Signed)
Physician Discharge Summary Note  Patient:  Thomas [name] is an 29 y.o., male MRN:  [id number] DOB:  01/24/1990 Patient phone:  [phone] (home)  Patient address:   [address] [place] [address],  Total Time spent with patient: 15 minutes  Date of Admission:  11/17/2019 Date of Discharge: 11/21/19  Reason for Admission:  suicidal ideation  Principal Problem: Severe episode of recurrent major depressive disorder, without psychotic features [facility]) Discharge Diagnoses: Principal Problem:   Severe episode of recurrent major depressive disorder, without psychotic features (HCC) Active Problems:   Generalized anxiety disorder   Insomnia due to anxiety and fear   Past Psychiatric History: Patient was seen approximately 1 month prior.  At that time he was placed on low-dose Zoloft.  Past Medical History:  Past Medical History:  Diagnosis Date  . Anxiety     Past Surgical History:  Procedure Laterality Date  . APPENDECTOMY     Family History: History reviewed. No pertinent family history. Family Psychiatric  History: Reports maternal grandfather had severe anxiety and required tranquilizers Social History:  Social History   Substance and Sexual Activity  Alcohol Use Yes   Comment: reported 20+ beers/day     Social History   Substance and Sexual Activity  Drug Use No    Social History   Socioeconomic History  . Marital status: Single    Spouse name: Not on file  . Number of children: Not on file  . Years of education: Not on file  . Highest education level: Not on file  Occupational History  . Not on file  Tobacco Use  . Smoking status: Never Smoker  . Smokeless tobacco: Never Used  Vaping Use  . Vaping Use: Never used  Substance and Sexual Activity  . Alcohol use: Yes    Comment: reported 20+ beers/day  . Drug use: No  . Sexual activity: Yes  Other Topics Concern  . Not on file  Social History Narrative  . Not on file   Social  Determinants of Health   Financial Resource Strain:   . Difficulty of Paying Living Expenses: Not on file  Food Insecurity:   . Worried About Programme researcher, broadcasting/film/video in the Last Year: Not on file  . Ran Out of Food in the Last Year: Not on file  Transportation Needs:   . Lack of Transportation (Medical): Not on file  . Lack of Transportation (Non-Medical): Not on file  Physical Activity:   . Days of Exercise per Week: Not on file  . Minutes of Exercise per Session: Not on file  Stress:   . Feeling of Stress : Not on file  Social Connections:   . Frequency of Communication with Friends and Family: Not on file  . Frequency of Social Gatherings with Friends and Family: Not on file  . Attends Religious Services: Not on file  . Active Member of Clubs or Organizations: Not on file  . Attends Banker Meetings: Not on file  . Marital Status: Not on file    Hospital Course:  From admission assessment 11/17/19: Thomas [name] an 29 y.o.singlemalewho presents unaccompanied to [facility] ED via Patent examiner after being petitioned for involuntary commitment by his mother, Thomas [name] [phone]. Affidavit and petition states: "[name] has generalized anxiety and panic disorder and takes Zolofy, Seroquel and Gabapentin. [name] drinks 24 cans of beer a day and is constantly vomiting. [name] says that he does not want to live anymore and  wants to purchase a gun but cannot walk. Not eating, sleeping, and having constant panic attacks. [name] is not responding to the medication that was prescribed to him."  Pt reports his anxiety started in July 2021 and has become increasingly severe over the past six weeks. He says he is having "constant panic attacks." He says he recently quit his job because he is unable to function. He says he sits alone in a dark room because he cannot tolerate light or sound. He states he drinks 20-25 beers daily and experiences withdrawal  symptoms when he tries to stop.Pt acknowledges symptoms including social withdrawal, loss of interest in usual pleasures, fatigue, irritability, decreased concentration, and feelings of guilt, worthlessness and hopelessness.He says he cannot sleep. He reports if he eats then he vomits. He says he believes he may be experiencing auditory hallucinations of hearing his deceased dog bark. He denies current suicidal ideation or history of suicide attempts. He denies intentional self-injurious behavior. He denies current homicidal ideation or history of aggression. Pt states he tried marijuana 1-2 months ago. He denies other substance use.  Pt says his anxiety started in July 2021 following graduation from school and obtaining a job in his field. He says he hated to work, quit the job, and believes he entered the wrong field. He states he feels like a failure and that he is unhappy with his life. He says he lives with his mother. He identifies his mother, father and grandmother as his primary supports. He denies history of abuse or trauma. He denies legal problems. He denies access to firearms. Pt is currently receiving outpatient medication management with Toy CookeyBrittney Parsons, NP. He denies history of inpatient psychiatric treatment.  Pt is dressed in hospital scrubs, alert and oriented x4. Pt speaks in a clear tone, at moderate volume and normal pace. Motor behavior appears normal. Eye contact isfair. Pt's mood isanxious and depressed,affect is congruent with mood. Thought process is coherent and relevant. There is no indication Pt is currently responding to internal stimuli or experiencing delusional thought content. Pt appears to have some difficulty concentrating. He says he wants to be discharged because he has a job interview at 0900.  Thomas [name] was admitted for alcohol dependence with anxiety and suicidal ideation. CIWA protocol was started for alcohol withdrawal. Zoloft and Seroquel were increased.  Klonopin and naltrexone were started. PRN Vistaril and trazodone were continued. [facility] He participated in group therapy on the unit. He responded well to treatment with no adverse effects reported. He has shown improved mood, affect, sleep, and interaction. He denies any SI/HI/AVH and contracts for safety. He is discharging on the medications listed below. [facility] (see below). Patient is provided with prescriptions for medications upon discharge. His mother is picking him up for discharge home.  Physical Findings: AIMS: Facial and Oral Movements Muscles of Facial Expression: None, normal Lips and Perioral Area: None, normal Jaw: None, normal Tongue: None, normal,Extremity Movements Upper (arms, wrists, hands, fingers): None, normal Lower (legs, knees, ankles, toes): None, normal, Trunk Movements Neck, shoulders, hips: None, normal, Overall Severity Severity of abnormal movements (highest score from questions above): None, normal Incapacitation due to abnormal movements: None, normal Patient's awareness of abnormal movements (rate only patient's report): No Awareness, Dental Status Current problems with teeth and/or dentures?: No Does patient usually wear dentures?: No  CIWA:  CIWA-Ar Total: 3 COWS:       Psychiatric Specialty  Exam: Physical Exam  Review of Systems  Blood pressure 125/83, pulse 94, temperature 97.8 F (36.6 C), temperature source Oral, resp. rate 18, height 5\' 11"  (1.803 m), weight 72.6 kg, SpO2 96 %.Body mass index is 22.32 kg/m.  See MD's discharge SRA      Has this patient used any form of tobacco in the last 30 days? (Cigarettes, Smokeless Tobacco, Cigars, and/or Pipes)  No  Blood Alcohol level:  Lab Results  Component Value Date   ETH 190 (H) 11/16/2019    Metabolic Disorder Labs:  Lab Results  Component Value Date   HGBA1C 5.1 11/18/2019   MPG 99.67 11/18/2019   No results found for:  PROLACTIN Lab Results  Component Value Date   CHOL 255 (H) 11/18/2019   TRIG 107 11/18/2019   HDL 90 11/18/2019   CHOLHDL 2.8 11/18/2019   VLDL 21 11/18/2019   LDLCALC 144 (H) 11/18/2019    See Psychiatric Specialty Exam and Suicide Risk Assessment completed by Attending Physician prior to discharge.  Discharge destination:  Home  Is patient on multiple antipsychotic therapies at discharge:  No   Has Patient had three or more failed trials of antipsychotic monotherapy by history:  No  Recommended Plan for Multiple Antipsychotic Therapies: NA  Discharge Instructions    Discharge instructions   Complete by: As directed    Activity as tolerated. Diet as recommended by primary care physician. Keep all scheduled follow-up appointments as recommended.     Allergies as of 11/21/2019   No Known Allergies     Medication List    STOP taking these medications   gabapentin 100 MG capsule Commonly known as: Neurontin     TAKE these medications     Indication  clonazePAM 1 MG tablet Commonly known as: KLONOPIN Take 1 tablet (1 mg total) by mouth 3 (three) times daily.  Indication: Feeling Anxious   hydrOXYzine 25 MG tablet Commonly known as: ATARAX/VISTARIL Take 1 tablet (25 mg total) by mouth 3 (three) times daily as needed.  Indication: Feeling Anxious   naltrexone 50 MG tablet Commonly known as: DEPADE Take 1 tablet (50 mg total) by mouth daily.  Indication: Abuse or Misuse of Alcohol   QUEtiapine 100 MG tablet Commonly known as: SEROQUEL Take 1 tablet (100 mg total) by mouth at bedtime. What changed:   medication strength  how much to take  Indication: Major Depressive Disorder   sertraline 100 MG tablet Commonly known as: ZOLOFT Take 1 tablet (100 mg total) by mouth daily. Start taking on: November 22, 2019 What changed:   medication strength  how much to take  Indication: Major Depressive Disorder   traZODone 100 MG tablet Commonly known as:  DESYREL Take 1 tablet (100 mg total) by mouth at bedtime as needed for sleep.  Indication: Trouble Sleeping       Follow-up Information    [facility]. Go on 11/27/2019.   Specialty: Behavioral Health Why: You have an appointment for substance abuse intensive outpatient therapy services on 12/13/19 at 3:00 pm. You also have an appointment for medication management services on 11/27/19 at 9:00 am. These appointments will be held in person. Contact information: [address] [place] [phone]              Follow-up recommendations: Activity as tolerated. Diet as recommended by primary care physician. Keep all scheduled follow-up appointments as recommended.   Comments:   Patient is instructed to take all prescribed medications  as recommended. Report any side effects or adverse reactions to your outpatient psychiatrist. Patient is instructed to abstain from alcohol and illegal drugs while on prescription medications. In the event of worsening symptoms, patient is instructed to call the crisis hotline, 911, or go to the nearest emergency department for evaluation and treatment.  Signed: Aldean Baker, NP 11/21/2019, 4:05 PM

## 2019-11-21 NOTE — Progress Notes (Signed)
Pt discharged to lobby. Pt was stable and appreciative at that time. All papers, samples and prescriptions were given and valuables returned. Verbal understanding expressed. Denies SI/HI and A/VH. Pt given opportunity to express concerns and ask questions.  

## 2019-11-21 NOTE — BHH Group Notes (Signed)
Patient did not attend morning orientation group.  

## 2019-11-23 ENCOUNTER — Ambulatory Visit (HOSPITAL_COMMUNITY): Payer: No Payment, Other | Admitting: Licensed Clinical Social Worker

## 2019-11-23 MED FILL — SERTRALINE HCL 100 MG TAB: 100 | 30 days supply | Qty: 30 | Fill #0

## 2019-11-23 MED FILL — QUETIAPINE FUMARATE 100 MG: 100 | 30 days supply | Qty: 30 | Fill #0

## 2019-11-23 MED FILL — NALTREXONE 50 MG TABLET: 50 | 30 days supply | Qty: 30 | Fill #0

## 2019-11-23 MED FILL — hydrOXYzine HCL 25 MG TABS: 25 | 10 days supply | Qty: 30 | Fill #0

## 2019-11-23 MED FILL — TRAZODONE HCL 100 MG TABS: 100 | 30 days supply | Qty: 30 | Fill #0

## 2019-11-27 ENCOUNTER — Encounter (HOSPITAL_COMMUNITY): Payer: Self-pay

## 2019-11-27 ENCOUNTER — Encounter (HOSPITAL_COMMUNITY): Payer: No Payment, Other | Admitting: Psychiatry

## 2019-12-07 ENCOUNTER — Telehealth (HOSPITAL_COMMUNITY): Payer: Self-pay | Admitting: Psychiatry

## 2019-12-08 ENCOUNTER — Other Ambulatory Visit (HOSPITAL_COMMUNITY): Payer: Self-pay | Admitting: Psychiatry

## 2019-12-08 DIAGNOSIS — F411 Generalized anxiety disorder: Secondary | ICD-10-CM

## 2019-12-08 DIAGNOSIS — F5105 Insomnia due to other mental disorder: Secondary | ICD-10-CM

## 2019-12-08 MED ORDER — HYDROXYZINE HCL 25 MG PO TABS: 25.0000 mg | ORAL_TABLET | Freq: Three times a day (TID) | ORAL | 0 refills | Status: DC | PRN

## 2019-12-08 MED ORDER — SERTRALINE HCL 100 MG PO TABS: 100.0000 mg | ORAL_TABLET | Freq: Every day | ORAL | 0 refills | Status: DC

## 2019-12-08 MED ORDER — TRAZODONE HCL 100 MG PO TABS: 100.0000 mg | ORAL_TABLET | Freq: Every evening | ORAL | 0 refills | Status: DC | PRN

## 2019-12-08 MED ORDER — QUETIAPINE FUMARATE 100 MG PO TABS: 100.0000 mg | ORAL_TABLET | Freq: Every day | ORAL | 0 refills | Status: DC

## 2019-12-08 MED ORDER — CLONAZEPAM 1 MG PO TABS: 1.0000 mg | ORAL_TABLET | Freq: Three times a day (TID) | ORAL | 0 refills | Status: DC

## 2019-12-08 NOTE — Telephone Encounter (Signed)
Patient informed provider that he is doing better. He noted that he started a new job at [employer] and is enjoying it. He also reported that he was admitted under IVC to [facility] on 11/17/2019 and informed provider that he is doing well since his discharge. He noted that he is not drinking alcohol daily and noted that he had a glass of wine when he got his new job. He also informed provider that he missed his last appointment because he started his new position. He noted that he is unsure of his new work schedule but would like to follow up with provider. He noted that when he received his new schedule he will call the clinic to set up an appointment. Provider endorsed understanding and agreed. Provider refilled patients medications and informed him that Klonopin would be tapered at his next visit. He endorsed understanding and agreed.

## 2019-12-12 ENCOUNTER — Telehealth (HOSPITAL_COMMUNITY): Payer: Self-pay | Admitting: *Deleted

## 2019-12-12 NOTE — Telephone Encounter (Signed)
Called stating he had gotten a call from [place], believed it was Brittney calling him, but what is was was a reminder appt for 12/8 with Shaletta. Called him to clarify the call and his appt. He reported he has a new job and cant keep the appt on the 8th. States once he gets settled in his new job he will call to set things up. Informed the front desk of cancellation.

## 2019-12-13 ENCOUNTER — Ambulatory Visit (HOSPITAL_COMMUNITY): Payer: Federal, State, Local not specified - Other | Admitting: Behavioral Health

## 2019-12-20 MED FILL — hydrOXYzine HCL 25 MG TABS: 25 | 30 days supply | Qty: 90 | Fill #0

## 2019-12-20 MED FILL — TRAZODONE HCL 100 MG TABS: 100 | 30 days supply | Qty: 30 | Fill #0

## 2019-12-20 MED FILL — QUETIAPINE FUMARATE 100 MG: 100 | 30 days supply | Qty: 30 | Fill #0

## 2019-12-20 MED FILL — SERTRALINE HCL 100 MG TAB: 100 | 30 days supply | Qty: 30 | Fill #0

## 2020-01-04 NOTE — Telephone Encounter (Signed)
Provider has spoken with patient regarding concerns.

## 2020-01-04 NOTE — Telephone Encounter (Signed)
Provider has spoken with patient regarding concerns. 

## 2020-08-09 ENCOUNTER — Telehealth (HOSPITAL_COMMUNITY): Payer: No Payment, Other | Admitting: Psychiatry

## 2020-09-17 ENCOUNTER — Encounter (HOSPITAL_COMMUNITY): Payer: Self-pay

## 2020-09-17 ENCOUNTER — Encounter (HOSPITAL_COMMUNITY): Payer: No Payment, Other | Admitting: Psychiatry

## 2021-01-09 ENCOUNTER — Telehealth (HOSPITAL_COMMUNITY): Payer: Self-pay | Admitting: Psychiatry

## 2021-01-09 NOTE — Telephone Encounter (Signed)
Patient requesting to speak with provider regarding medications. He has not been seen at clinic since Octo 2021, due to multiple cancellations and no shows. Informed that changes may not be handled until next visit on 1/27. Writer informed that provider will call regarding matter at earliest convenience.

## 2021-01-09 NOTE — Telephone Encounter (Signed)
Provider attempted to call patient twice without success.  Provider left voicemail informing patient that he can walk-in to clinic if needed on Mondays or Tuesdays between the hours of 8 and 11 AM.  He does have an appointment scheduled for 01/31/2021.

## 2021-01-20 ENCOUNTER — Emergency Department (HOSPITAL_COMMUNITY)
Admission: EM | Admit: 2021-01-20 | Discharge: 2021-01-21 | Disposition: A | Discharge status: Home / Self Care | Payer: Self-pay | Attending: Emergency Medicine | Admitting: Emergency Medicine

## 2021-01-20 DIAGNOSIS — F1994 Other psychoactive substance use, unspecified with psychoactive substance-induced mood disorder: Secondary | ICD-10-CM

## 2021-01-20 DIAGNOSIS — R45851 Suicidal ideations: Secondary | ICD-10-CM | POA: Insufficient documentation

## 2021-01-20 DIAGNOSIS — F332 Major depressive disorder, recurrent severe without psychotic features: Secondary | ICD-10-CM | POA: Diagnosis present

## 2021-01-20 DIAGNOSIS — Z20822 Contact with and (suspected) exposure to covid-19: Secondary | ICD-10-CM | POA: Insufficient documentation

## 2021-01-20 DIAGNOSIS — F419 Anxiety disorder, unspecified: Secondary | ICD-10-CM | POA: Insufficient documentation

## 2021-01-20 LAB — COMPREHENSIVE METABOLIC PANEL
ALT: 39 U/L (ref 0–44)
AST: 41 U/L (ref 15–41)
Albumin: 4.8 g/dL (ref 3.5–5.0)
Alkaline Phosphatase: 90 U/L (ref 38–126)
Anion gap: 15 (ref 5–15)
BUN: 7 mg/dL (ref 6–20)
CO2: 26 mmol/L (ref 22–32)
Calcium: 9 mg/dL (ref 8.9–10.3)
Chloride: 96 mmol/L — ABNORMAL LOW (ref 98–111)
Creatinine, Ser: 0.8 mg/dL (ref 0.61–1.24)
GFR, Estimated: >60 mL/min (ref >60)
Glucose, Bld: 105 mg/dL — ABNORMAL HIGH (ref 70–99)
Potassium: 3.8 mmol/L (ref 3.5–5.1)
Sodium: 137 mmol/L (ref 135–145)
Total Bilirubin: 1.2 mg/dL (ref 0.3–1.2)
Total Protein: 8.8 g/dL — ABNORMAL HIGH (ref 6.5–8.1)

## 2021-01-20 LAB — CBC
HCT: 49.6 % (ref 39.0–52.0)
Hemoglobin: 17.7 g/dL — ABNORMAL HIGH (ref 13.0–17.0)
MCH: 31.8 pg (ref 26.0–34.0)
MCHC: 35.7 g/dL (ref 30.0–36.0)
MCV: 89 fL (ref 80.0–100.0)
Platelets: 400 10*3/uL (ref 150–400)
RBC: 5.57 MIL/uL (ref 4.22–5.81)
RDW: 12.6 % (ref 11.5–15.5)
WBC: 11.3 10*3/uL — ABNORMAL HIGH (ref 4.0–10.5)
nRBC: 0 % (ref 0.0–0.2)

## 2021-01-20 LAB — ACETAMINOPHEN LEVEL: Acetaminophen (Tylenol), Serum: <10 ug/mL — ABNORMAL LOW (ref 10–30)

## 2021-01-20 LAB — RAPID URINE DRUG SCREEN, HOSP PERFORMED
Amphetamines: NOT DETECTED
Barbiturates: NOT DETECTED
Benzodiazepines: NOT DETECTED
Cocaine: NOT DETECTED
Opiates: NOT DETECTED
Tetrahydrocannabinol: POSITIVE — AB

## 2021-01-20 LAB — ETHANOL: Alcohol, Ethyl (B): 231 mg/dL — ABNORMAL HIGH (ref <10)

## 2021-01-20 LAB — SALICYLATE LEVEL: Salicylate Lvl: <7 mg/dL — ABNORMAL LOW (ref 7.0–30.0)

## 2021-01-20 NOTE — ED Triage Notes (Signed)
Pt is being IVC by his mom for drinking every day for the past week and not having healthy coping mechanisms for his anxiety. Pt states that he wants help for his anxiety and needs some medication.

## 2021-01-20 NOTE — ED Provider Triage Note (Signed)
Emergency Medicine Provider Triage Evaluation Note  Thomas [name] , a 31 y.o. male  was evaluated in triage.  Pt senting with [facility] under IVC paperwork by his mother.  Per IVC paperwork patient has suffered from multiple panic attacks this week.  Patient has been drinking heavily to cope with panic attacks and has been drinking at least 3 cases of beer a day.  Mother advised that this was abnormal.  [name] has apparently not eaten in a week and has not taken a bath in about 3 days.  Mom reports that patient vomited and urinated on himself and has not cleaned up since then.  Today apparently told mother that he wanted to "on the life" himself.  When asked how he stated that he would purchase a gun.  She admits that he feels very anxious.  He states that he has been drinking heavily to cope with this.  He states that he also took 2 over-the-counter Benadryl earlier tonight to help him sleep without relief.  He states he is requesting something to help with his anxiety currently.  He specifically denies plan for self-harm however does state that if he does not get help with his anxiety feels like he could harm himself in the future.  He denies any HI or AVH.  Review of Systems  Positive: + anxiety, suicidal ideation Negative:   Physical Exam  BP 116/77 (BP Location: Left Arm)    Pulse (!) 106    Temp 98.5 F (36.9 C) (Oral)    Resp 16    SpO2 95%  Gen:   Awake, no distress   Resp:  Normal effort  MSK:   Moves extremities without difficulty  Other:    Medical Decision Making  Medically screening exam initiated at 10:54 PM.  Appropriate orders placed.  Jusitn A Sanguinetti was informed that the remainder of the evaluation will be completed by another provider, this initial triage assessment does not replace that evaluation, and the importance of remaining in the ED until their evaluation is complete.     Tanda Rockers, PA-C 01/20/21 2256

## 2021-01-21 ENCOUNTER — Other Ambulatory Visit: Payer: Self-pay

## 2021-01-21 ENCOUNTER — Encounter (HOSPITAL_COMMUNITY): Payer: Self-pay

## 2021-01-21 DIAGNOSIS — F1994 Other psychoactive substance use, unspecified with psychoactive substance-induced mood disorder: Secondary | ICD-10-CM

## 2021-01-21 LAB — RESP PANEL BY RT-PCR (FLU A&B, COVID) ARPGX2
Influenza A by PCR: NEGATIVE
Influenza B by PCR: NEGATIVE
SARS Coronavirus 2 by RT PCR: NEGATIVE

## 2021-01-21 MED ORDER — THIAMINE HCL 100 MG PO TABS
100.0000 mg | ORAL_TABLET | Freq: Every day | ORAL | Status: DC
  Administered 2021-01-21: 100 mg via ORAL
  Filled 2021-01-21: qty 1

## 2021-01-21 MED ORDER — SERTRALINE HCL 50 MG PO TABS: 50.0000 mg | ORAL_TABLET | Freq: Every day | ORAL | 0 refills | Status: DC

## 2021-01-21 MED ORDER — LORAZEPAM 2 MG/ML IJ SOLN: 0.0000 mg | Freq: Two times a day (BID) | INTRAMUSCULAR | Status: DC

## 2021-01-21 MED ORDER — HYDROXYZINE HCL 25 MG PO TABS
25.0000 mg | ORAL_TABLET | Freq: Once | ORAL | Status: AC
Start: 2021-01-21 — End: 2021-01-21
  Administered 2021-01-21: 25 mg via ORAL
  Filled 2021-01-21: qty 1

## 2021-01-21 MED ORDER — THIAMINE HCL 100 MG/ML IJ SOLN: 100.0000 mg | Freq: Every day | INTRAMUSCULAR | Status: DC

## 2021-01-21 MED ORDER — LORAZEPAM 1 MG PO TABS
2.0000 mg | ORAL_TABLET | Freq: Once | ORAL | Status: AC
  Administered 2021-01-21: 2 mg via ORAL
  Filled 2021-01-21: qty 2

## 2021-01-21 MED ORDER — LORAZEPAM 1 MG PO TABS
0.0000 mg | ORAL_TABLET | Freq: Four times a day (QID) | ORAL | Status: DC
  Administered 2021-01-21: 2 mg via ORAL
  Administered 2021-01-21: 1 mg via ORAL
  Filled 2021-01-21: qty 2
  Filled 2021-01-21: qty 1

## 2021-01-21 MED ORDER — SERTRALINE HCL 50 MG PO TABS
50.0000 mg | ORAL_TABLET | Freq: Once | ORAL | Status: AC
  Administered 2021-01-21: 50 mg via ORAL
  Filled 2021-01-21: qty 1

## 2021-01-21 MED ORDER — LORAZEPAM 2 MG/ML IJ SOLN: 0.0000 mg | Freq: Four times a day (QID) | INTRAMUSCULAR | Status: DC

## 2021-01-21 MED ORDER — LORAZEPAM 1 MG PO TABS: 0.0000 mg | ORAL_TABLET | Freq: Two times a day (BID) | ORAL | Status: DC

## 2021-01-21 NOTE — Discharge Instructions (Addendum)
For your behavioral health needs you are advised to follow up with [facility].  Your next scheduled appointment with Toy Cookey, NP is on Friday, January 31, 2021, however, you are advised to go their for psychiatry during walk-in hours within the next 72 hours:      [facility]      [address]      [place], [place] [address]      [phone]      Walk-in hours are Monday - Thursday from 8:00 am - 11:00 am for psychiatry.  Walk-in patients are seen on a first come, first served basis, so try to arrive as early as possible for the best chance of being seen the same day.

## 2021-01-21 NOTE — ED Notes (Signed)
Pt wanded by security. 

## 2021-01-21 NOTE — Consult Note (Signed)
Telepsych Consultation   Reason for Consult:  Psychiatric Reassessment  Referring Physician:  Silverio Decamp, PA-C Location of Patient:    [facility] Location of Provider: Other: virtual home office  Patient Identification: Thomas [name] MRN:  [id number] Principal Diagnosis: Substance induced mood disorder ([place]) Diagnosis:  Principal Problem:   Substance induced mood disorder ([place]) Active Problems:   MDD (major depressive disorder), recurrent episode, severe ([place])   Total Time spent with patient: 30 minutes  Subjective:   Thomas [name] is [name] 31 y.o. male patient with hx of MDD and alcohol abuse, admitted via IVC for substance induced mood disorder.  Patient states today, "I have never tried to hurt myself and I am not going to do it now.  I just had too much to drink".  HPI:   Patient seen via telepsych by this provider; chart reviewed and consulted with Dr. Dwyane Dee on 01/21/21.  On evaluation Thomas [name] reports [name] history for alcoholism, but states he only drinks when he is not working.  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  Regarding events that led to current hospitalization, states he's on vacation from work this week, has been stressed with relationship issues and endorses drinking "too much alcohol."  Had suicidal ideations but states he had no intent on hurting himself.  Admits he is impulsive with excessive alcohol but now that he is sober no longer feels this way.  At baseline he has hx of MDD and is followed by Clearence Ped for outpatient mental health, med mgmt.  He last saw her in November and since he was feeling better he stopped taking his sertraline, and quetiapine and traxodone.  Admits that was not the best choice as they were helping stabilize his mood.  He agrees to restart sertraline today at [name] lower dose.    Since admission, he has been cooperative with staff, has been  anxious but otherwise no behavioral concerns.  Admission BAL was 231 he was placed on CIWA, lorazepam.  Other than anxiety, he has not had tremors, nausea or withdrawal symptoms.  Sleep and appetite intact.  LFTs WNL to continue psychotropic medications.  He was previously medically cleared.     Risk to Self:  no Risk to Others:  no Prior Inpatient Therapy: no  Prior Outpatient Therapy:  yes  Past Medical History:  Past Medical History:  Diagnosis Date   Anxiety     Past Surgical History:  Procedure Laterality Date   APPENDECTOMY     Family History: History reviewed. No pertinent family history. Family Psychiatric  History: unknown Social History:  Social History   Substance and Sexual Activity  Alcohol Use Yes   Comment: reported 20+ beers/day     Social History   Substance and Sexual Activity  Drug Use No    Social History   Socioeconomic History   Marital status: Single    Spouse name: Not on file   Number of children: Not on file   Years of education: Not on file   Highest education level: Not on file  Occupational History   Not on file  Tobacco Use   Smoking status: Never   Smokeless tobacco: Never  Vaping Use   Vaping Use: Never used  Substance and Sexual Activity   Alcohol use: Yes    Comment: reported 20+ beers/day   Drug use: No   Sexual activity: Yes  Other Topics Concern   Not on file  Social History Narrative   Not on file   Social Determinants of Health   Financial Resource Strain: Not on file  Food Insecurity: Not on file  Transportation Needs: Not on file  Physical Activity: Not on file  Stress: Not on file  Social Connections: Not on file   Additional Social History:    Allergies:  No Known Allergies  Labs:  Results for orders placed or performed during the hospital encounter of 01/20/21 (from the past 48 hour(s))  Rapid urine drug screen (hospital performed)     Status: Abnormal   Collection Time: 01/20/21 10:14 PM  Result  Value Ref Range   Opiates NONE DETECTED NONE DETECTED   Cocaine NONE DETECTED NONE DETECTED   Benzodiazepines NONE DETECTED NONE DETECTED   Amphetamines NONE DETECTED NONE DETECTED   Tetrahydrocannabinol POSITIVE ([name]) NONE DETECTED   Barbiturates NONE DETECTED NONE DETECTED    Comment: (NOTE) DRUG SCREEN FOR MEDICAL PURPOSES ONLY.  IF CONFIRMATION IS NEEDED FOR ANY PURPOSE, NOTIFY LAB WITHIN 5 DAYS.  LOWEST DETECTABLE LIMITS FOR URINE DRUG SCREEN Drug Class                     Cutoff (ng/mL) Amphetamine and metabolites    1000 Barbiturate and metabolites    200 Benzodiazepine                 [id number] Tricyclics and metabolites     300 Opiates and metabolites        300 Cocaine and metabolites        300 THC                            50 Performed at [facility], [place] [address], [place], [place] [address]   Comprehensive metabolic panel     Status: Abnormal   Collection Time: 01/20/21 10:29 PM  Result Value Ref Range   Sodium 137 135 - 145 mmol/L   Potassium 3.8 3.5 - 5.1 mmol/L   Chloride 96 (L) 98 - 111 mmol/L   CO2 26 22 - 32 mmol/L   Glucose, Bld 105 (H) 70 - 99 mg/dL    Comment: Glucose reference range applies only to samples taken after fasting for at least 8 hours.   BUN 7 6 - 20 mg/dL   Creatinine, Ser 0.80 0.61 - 1.24 mg/dL   Calcium 9.0 8.9 - 10.3 mg/dL   Total Protein 8.8 (H) 6.5 - 8.1 g/dL   Albumin 4.8 3.5 - 5.0 g/dL   AST 41 15 - 41 U/L   ALT 39 0 - 44 U/L   Alkaline Phosphatase 90 38 - 126 U/L   Total Bilirubin 1.2 0.3 - 1.2 mg/dL   GFR, Estimated >60 >60 mL/min    Comment: (NOTE) Calculated using the CKD-EPI Creatinine Equation (2021)    Anion gap 15 5 - 15    Comment: Performed at [facility], [place] [address] [place], [place] [address]  Ethanol     Status: Abnormal   Collection Time: 01/20/21 10:29 PM  Result Value Ref Range   Alcohol, Ethyl (B) 231 (H) <10 mg/dL    Comment: (NOTE) Lowest detectable limit for  serum alcohol is 10 mg/dL.  For medical purposes only. Performed at [facility], [place] [address] [place], [place] [id number]   Salicylate level     Status: Abnormal  Collection Time: 01/20/21 10:29 PM  Result Value Ref Range   Salicylate Lvl <7.0 (L) 7.0 - 30.0 mg/dL    Comment: Performed at [facility], [address], [place], [place] [address]  Acetaminophen level     Status: Abnormal   Collection Time: 01/20/21 10:29 PM  Result Value Ref Range   Acetaminophen (Tylenol), Serum <10 (L) 10 - 30 ug/mL    Comment: (NOTE) Therapeutic concentrations vary significantly. [name] range of 10-30 ug/mL  may be an effective concentration for many patients. However, some  are best treated at concentrations outside of this range. Acetaminophen concentrations >150 ug/mL at 4 hours after ingestion  and >50 ug/mL at 12 hours after ingestion are often associated with  toxic reactions.  Performed at [facility], [address], [place], [place] [address]   cbc     Status: Abnormal   Collection Time: 01/20/21 10:29 PM  Result Value Ref Range   WBC 11.3 (H) 4.0 - 10.5 K/uL   RBC 5.57 4.22 - 5.81 MIL/uL   Hemoglobin 17.7 (H) 13.0 - 17.0 g/dL   HCT 22.9 79.8 - 92.1 %   MCV 89.0 80.0 - 100.0 fL   MCH 31.8 26.0 - 34.0 pg   MCHC 35.7 30.0 - 36.0 g/dL   RDW 19.4 17.4 - 08.1 %   Platelets 400 150 - 400 K/uL   nRBC 0.0 0.0 - 0.2 %    Comment: Performed at [facility], [address], [place], [place] [address]  Resp Panel by RT-PCR (Flu [name]&B, Covid) Nasopharyngeal Swab     Status: None   Collection Time: 01/21/21  1:15 AM   Specimen: Nasopharyngeal Swab; Nasopharyngeal(NP) swabs in vial transport medium  Result Value Ref Range   SARS Coronavirus 2 by RT PCR NEGATIVE NEGATIVE    Comment: (NOTE) SARS-CoV-2 target nucleic acids are NOT DETECTED.  The SARS-CoV-2 RNA is generally detectable in upper respiratory specimens  during the acute phase of infection. The lowest concentration of SARS-CoV-2 viral copies this assay can detect is 138 copies/mL. [name] negative result does not preclude SARS-Cov-2 infection and should not be used as the sole basis for treatment or other patient management decisions. [name] negative result may occur with  improper specimen collection/handling, submission of specimen other than nasopharyngeal swab, presence of viral mutation(s) within the areas targeted by this assay, and inadequate number of viral copies(<138 copies/mL). [name] negative result must be combined with clinical observations, patient history, and epidemiological information. The expected result is Negative.  Fact Sheet for Patients:  BloggerCourse.com  Fact Sheet for Healthcare Providers:  SeriousBroker.it  This test is no t yet approved or cleared by the [place] FDA and  has been authorized for detection and/or diagnosis of SARS-CoV-2 by FDA under an Emergency Use Authorization (EUA). This EUA will remain  in effect (meaning this test can be used) for the duration of the COVID-19 declaration under Section 564(b)(1) of the Act, 21 U.S.C.section 360bbb-3(b)(1), unless the authorization is terminated  or revoked sooner.       Influenza [name] by PCR NEGATIVE NEGATIVE   Influenza B by PCR NEGATIVE NEGATIVE    Comment: (NOTE) The Xpert Xpress SARS-CoV-2/FLU/RSV plus assay is intended as an aid in the diagnosis of influenza from Nasopharyngeal swab specimens and should not be used as [name] sole basis for treatment. Nasal washings and aspirates are unacceptable for Xpert Xpress SARS-CoV-2/FLU/RSV testing.  Fact Sheet for Patients: BloggerCourse.com  Fact Sheet for Healthcare  Providers: IncredibleEmployment.be  This test is not yet approved or cleared by the [place] and has been authorized for detection and/or diagnosis  of SARS-CoV-2 by FDA under an Emergency Use Authorization (EUA). This EUA will remain in effect (meaning this test can be used) for the duration of the COVID-19 declaration under Section 564(b)(1) of the Act, 21 U.S.C. section 360bbb-3(b)(1), unless the authorization is terminated or revoked.  Performed at [facility], [place] [address] [place], [place] [address]     Medications:  Current Facility-Administered Medications  Medication Dose Route Frequency Provider Last Rate Last Admin   LORazepam (ATIVAN) injection 0-4 mg  0-4 mg Intravenous Q6H Sponseller, Rebekah R, PA-C       Or   LORazepam (ATIVAN) tablet 0-4 mg  0-4 mg Oral Q6H Sponseller, Rebekah R, PA-C   1 mg at 01/21/21 [id number]   [START ON 01/23/2021] LORazepam (ATIVAN) injection 0-4 mg  0-4 mg Intravenous Q12H Sponseller, Rebekah R, PA-C       Or   [START ON 01/23/2021] LORazepam (ATIVAN) tablet 0-4 mg  0-4 mg Oral Q12H Sponseller, Rebekah R, PA-C       thiamine tablet 100 mg  100 mg Oral Daily Sponseller, Rebekah R, PA-C   100 mg at 01/21/21 1032   Or   thiamine (B-1) injection 100 mg  100 mg Intravenous Daily Sponseller, Rebekah R, PA-C       Current Outpatient Medications  Medication Sig Dispense Refill   BENADRYL ALLERGY 25 MG capsule Take 25 mg by mouth as needed for sleep.     clonazePAM (KLONOPIN) 1 MG tablet Take 1 tablet (1 mg total) by mouth 3 (three) times daily. (Patient not taking: Reported on 01/21/2021) 30 tablet 0   QUEtiapine (SEROQUEL) 100 MG tablet TAKE 1 TABLET (100 MG TOTAL) BY MOUTH AT BEDTIME. (Patient not taking: Reported on 01/21/2021) 30 tablet 0   sertraline (ZOLOFT) 50 MG tablet Take 1 tablet (50 mg total) by mouth daily. 30 tablet 0   traZODone (DESYREL) 100 MG tablet TAKE 1 TABLET (100 MG TOTAL) BY MOUTH AT BEDTIME AS NEEDED FOR SLEEP. (Patient not taking: Reported on 01/21/2021) 30 tablet 0    Musculoskeletal: Strength & Muscle Tone: within normal limits Gait & Station:  normal Patient leans: N/[name]   Psychiatric Specialty Exam:  Presentation  General Appearance: Appropriate for Environment  Eye Contact:Good  Speech:Clear and Coherent  Speech Volume:Normal  Handedness:Right   Mood and Affect  Mood:Anxious  Affect:Congruent   Thought Process  Thought Processes:Coherent; Goal Directed  Descriptions of Associations:Circumstantial  Orientation:Full (Time, Place and Person)  Thought Content:Logical (has improved since he's not acutely intoxicated)  History of Schizophrenia/Schizoaffective disorder:No  Duration of Psychotic Symptoms:No data recorded Hallucinations:Hallucinations: None  Ideas of Reference:None  Suicidal Thoughts:Suicidal Thoughts: No (has improved since he's not acutely intoxicated)  Homicidal Thoughts:Homicidal Thoughts: No   Sensorium  Memory:Immediate Good; Recent Good; Remote Good  Judgment:Good (good today but can be impulsive when intoxicated)  Insight:Fair   Executive Functions  Concentration:Fair  Attention Span:Fair  [place]  Language:Good   Psychomotor Activity  Psychomotor Activity:Psychomotor Activity: Normal   Assets  Assets:Communication Skills; Housing; Desire for Improvement; Vocational/Educational   Sleep  Sleep:Sleep: Good Number of Hours of Sleep: 7    Physical Exam: Physical Exam Constitutional:      Appearance: Normal appearance.  Cardiovascular:     Rate and Rhythm: Normal rate.     Pulses: Normal pulses.  Pulmonary:  Effort: Pulmonary effort is normal.  Musculoskeletal:        General: Normal range of motion.     Cervical back: Normal range of motion.  Neurological:     General: No focal deficit present.     Mental Status: He is alert and oriented to person, place, and time.  Psychiatric:        Attention and Perception: Attention and perception normal.        Mood and Affect: Mood is anxious.        Speech: Speech normal.         Behavior: Behavior is cooperative.        Thought Content: Thought content normal. Thought content is not paranoid or delusional. Thought content does not include homicidal or suicidal ideation. Thought content does not include homicidal or suicidal plan.        Cognition and Memory: Cognition and memory normal.        Judgment: Judgment normal.   Review of Systems  Constitutional: Negative.   HENT: Negative.    Eyes: Negative.   Respiratory: Negative.    Cardiovascular: Negative.   Gastrointestinal: Negative.   Genitourinary: Negative.   Skin: Negative.   Neurological: Negative.   Endo/Heme/Allergies: Negative.   Psychiatric/Behavioral:  Positive for depression and substance abuse. Negative for hallucinations and suicidal ideas. The patient is nervous/anxious. The patient does not have insomnia.   Blood pressure 138/85, pulse 100, temperature 98.4 F (36.9 C), temperature source Oral, resp. rate 18, height 6' (1.829 m), weight 72.6 kg, SpO2 98 %. Body mass index is 21.7 kg/m.  Treatment Plan Summary: Patient has sobered up and no longer endorses suicidal ideations, plan to intent.  Plan- As per above assessment, there are no current grounds for involuntary commitment at this time.   Patient is not currently interested in inpatient services but expresses agreement to continue outpatient treatment., we have reviewed importance of substance abuse abstinence, potential negative impact substance abuse can have on his relationships and level of functioning, and importance of medication compliance.  He declines referral to [facility] or resources for substance abuse.  I have restarted his sertraline at 50mg  daily and sent [name] 30 day supply to his outpatient pharmacy. States he has more quetiapine and trazodone at home, agrees to restart them as well. He agrees to follow-up with his outpatient mental health provider Clea[facility] Ped for med mgmt within the next 72 hours. Above discussed with patient  concordance.   Disposition: No evidence of imminent risk to self or others at present.   Patient does not meet criteria for psychiatric inpatient admission. Supportive therapy provided about ongoing stressors. Discussed crisis plan, support from social network, calling 911, coming to the Emergency Department, and calling Suicide Hotline.  This service was provided via telemedicine using [name] 2-way, interactive audio and video technology.  Names of all persons participating in this telemedicine service and their role in this encounter. Name: Penne Lash  Role: Patient  Name: Merlyn Lot Role: PMHNP    Mallie Darting, NP 01/21/2021 3:28 PM

## 2021-01-21 NOTE — ED Notes (Signed)
Pt dressed in wine color scrubs and wanded by security. Pt belongings in white belongings bag x1 in cabinet behind nurse station across from room 17.

## 2021-01-21 NOTE — ED Notes (Addendum)
Pt doingTTS consult in conference room.
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
## 2021-01-21 NOTE — ED Notes (Addendum)
Pt in bed with eyes closed, pt arouses easily to verbal stim, pt has no requests at this time.  Sitter at bedside, md notified of heart rate, advised to provide breakfast and oral fluids.

## 2021-01-21 NOTE — ED Notes (Signed)
Pt in bed, meal tray given, pt states that he is still anxious and wants to know when he can go home, reviewed plan of care with pt.  Pt verbalized understanding.

## 2021-01-21 NOTE — ED Notes (Signed)
Breakfast tray provided. 

## 2021-01-21 NOTE — ED Notes (Signed)
IVC PAPERWORK FAXED TO [phone]

## 2021-01-21 NOTE — ED Notes (Signed)
Patient at nurse desk made telephone call

## 2021-01-21 NOTE — ED Provider Notes (Signed)
[facility] DEPT Provider Note   CSN: [id number] Arrival date & time: 01/20/21  2158     History  Chief Complaint  Patient presents with   IVC    Anxiety   Depression   Alcohol Problem    Thomas [name] is a 31 y.o. male who presents under IVC with concern for suicidality.  Patient with history of severe anxiety disorder and major depressive disorder not currently on any medication (though he is prescribed Seroquel Zoloft and Klonopin as well as trazodone for sleep).  He was placed under IVC today by his mother who is concerned for his safety.  Patient has been going through a season of exacerbation of his anxiety and has been drinking to excess of alcohol for the last several days.  Today while inebriated patient told his mother that he was planning to take his life and was considering buying a gun to do so.  For this reason he was involuntarily committed.  I have personally reviewed this patient's medical records.  In addition to the above listed complications patient does have history of insomnia.  [facility] in November 2021.  States that he has an appointment with a counselor to reestablish mental health care on 01/31/2021.  HPI     Home Medications Prior to Admission medications   Medication Sig Start Date End Date Taking? Authorizing Provider  BENADRYL ALLERGY 25 MG capsule Take 25 mg by mouth as needed for sleep.   Yes [provider]  clonazePAM (KLONOPIN) 1 MG tablet Take 1 tablet (1 mg total) by mouth 3 (three) times daily. Patient not taking: Reported on 01/21/2021 12/08/19   Salley Slaughter, NP  QUEtiapine (SEROQUEL) 100 MG tablet TAKE 1 TABLET (100 MG TOTAL) BY MOUTH AT BEDTIME. Patient not taking: Reported on 01/21/2021 12/08/19 01/21/21  Salley Slaughter, NP  sertraline (ZOLOFT) 100 MG tablet TAKE 1 TABLET (100 MG TOTAL) BY MOUTH DAILY. Patient not taking: Reported on 01/21/2021 12/08/19  01/21/21  Salley Slaughter, NP  traZODone (DESYREL) 100 MG tablet TAKE 1 TABLET (100 MG TOTAL) BY MOUTH AT BEDTIME AS NEEDED FOR SLEEP. Patient not taking: Reported on 01/21/2021 12/08/19 01/21/21  Salley Slaughter, NP  gabapentin (NEURONTIN) 100 MG capsule Take 1 capsule (100 mg total) by mouth 3 (three) times daily. 11/08/19 11/21/19  Salley Slaughter, NP      Allergies    Patient has no known allergies.    Review of Systems   Review of Systems  Constitutional: Negative.   HENT: Negative.    Respiratory: Negative.    Cardiovascular: Negative.   Gastrointestinal: Negative.   Genitourinary: Negative.   Musculoskeletal: Negative.   Neurological: Negative.   Hematological: Negative.   Psychiatric/Behavioral:  Positive for agitation, self-injury, sleep disturbance and suicidal ideas. Negative for hallucinations. The patient is nervous/anxious.    Physical Exam Updated Vital Signs BP 128/75 (BP Location: Left Arm)    Pulse (!) 114    Temp 98.5 F (36.9 C) (Oral)    Resp 19    Ht 6' (1.829 m)    Wt 72.6 kg    SpO2 99%    BMI 21.70 kg/m  Physical Exam Vitals and nursing note reviewed.  Constitutional:      Appearance: He is not ill-appearing or toxic-appearing.  HENT:     Head: Normocephalic and atraumatic.     Nose: Nose normal.     Mouth/Throat:     Mouth: Mucous  membranes are moist.     Pharynx: No oropharyngeal exudate or posterior oropharyngeal erythema.  Eyes:     General:        Right eye: No discharge.        Left eye: No discharge.     Extraocular Movements: Extraocular movements intact.     Conjunctiva/sclera: Conjunctivae normal.     Pupils: Pupils are equal, round, and reactive to light.  Cardiovascular:     Rate and Rhythm: Normal rate and regular rhythm.     Pulses: Normal pulses.     Heart sounds: Normal heart sounds. No murmur heard. Pulmonary:     Effort: Pulmonary effort is normal. No respiratory distress.     Breath sounds: Normal breath sounds. No  wheezing or rales.  Abdominal:     General: Bowel sounds are normal. There is no distension.     Palpations: Abdomen is soft.     Tenderness: There is no abdominal tenderness. There is no right CVA tenderness, left CVA tenderness, guarding or rebound.  Musculoskeletal:        General: No deformity.     Cervical back: Neck supple. No tenderness.     Right lower leg: No edema.     Left lower leg: No edema.  Lymphadenopathy:     Cervical: No cervical adenopathy.  Skin:    General: Skin is warm and dry.     Capillary Refill: Capillary refill takes less than 2 seconds.  Neurological:     General: No focal deficit present.     Mental Status: He is alert and oriented to person, place, and time. Mental status is at baseline.  Psychiatric:        Mood and Affect: Mood is anxious.        Behavior: Behavior is agitated and hyperactive.        Thought Content: Thought content includes suicidal ideation. Thought content does not include homicidal ideation. Thought content includes suicidal plan.     Comments: Does not appear to be responding to internal stimuli at this time.     ED Results / Procedures / Treatments   Labs (all labs ordered are listed, but only abnormal results are displayed) Labs Reviewed  COMPREHENSIVE METABOLIC PANEL - Abnormal; Notable for the following components:      Result Value   Chloride 96 (*)    Glucose, Bld 105 (*)    Total Protein 8.8 (*)    All other components within normal limits  ETHANOL - Abnormal; Notable for the following components:   Alcohol, Ethyl (B) 231 (*)    All other components within normal limits  SALICYLATE LEVEL - Abnormal; Notable for the following components:   Salicylate Lvl <7.0 (*)    All other components within normal limits  ACETAMINOPHEN LEVEL - Abnormal; Notable for the following components:   Acetaminophen (Tylenol), Serum <10 (*)    All other components within normal limits  CBC - Abnormal; Notable for the following  components:   WBC 11.3 (*)    Hemoglobin 17.7 (*)    All other components within normal limits  RAPID URINE DRUG SCREEN, HOSP PERFORMED - Abnormal; Notable for the following components:   Tetrahydrocannabinol POSITIVE (*)    All other components within normal limits  RESP PANEL BY RT-PCR (FLU A&B, COVID) [id number]    EKG None  Radiology No results found.  Procedures Procedures    Medications Ordered in ED Medications  LORazepam (ATIVAN) injection 0-4 mg ( Intravenous See  Alternative 01/21/21 0642)    Or  LORazepam (ATIVAN) tablet 0-4 mg (1 mg Oral Given 01/21/21 0642)  LORazepam (ATIVAN) injection 0-4 mg (has no administration in time range)    Or  LORazepam (ATIVAN) tablet 0-4 mg (has no administration in time range)  thiamine tablet 100 mg (has no administration in time range)    Or  thiamine (B-1) injection 100 mg (has no administration in time range)  hydrOXYzine (ATARAX) tablet 25 mg (25 mg Oral Given 01/21/21 0132)    ED Course/ Medical Decision Making/ A&P Clinical Course as of 01/21/21 0713  Mon Jan 20, 2021  2230 Patient medically cleared for TTS eval.  [RS]  Tue Jan 21, 2021  0651 Per psych patient meets inpatient criteria.  [RS]    Clinical Course User Index [RS] Khristine Verno, Gypsy Balsam, PA-C                           Medical Decision Making 32 year old male with history of poorly managed anxiety who presents today with exacerbation of his anxiety and suicidality.  Tachycardic on exam but otherwise reassuring vital signs.  Cardiopulmonary exam with tachycardia but otherwise unremarkable.  Abdominal exam is benign.  Patient is neurovascular intact in all 4 extremities.  He denies HI or AVH at this time.  Patient is quite agitated, hyperactive in the room, does not appear to be responding to internal stimuli but repetitively states that he does not want to be here in the hospital and wants to be allowed to go home.  Patient states he "cannot believe she told you  that" in regards to his mother communicating his suicidal threats with medical staff and law enforcement, however he does not deny suicidality throughout our conversation.   Amount and/or Complexity of Data Reviewed External Data Reviewed:     Details: Further information gathered from IVC paperwork brought by law enforcement. Labs: ordered.    Details: CBC with mild Mutaz of 11,000, CMP with normal renal and kidney function.  Elevated alcohol level to 230s.  Normal salicylate and acetaminophen levels.  Respiratory pathogen panel is negative. Discussion of management or test interpretation with external provider(s): Case was discussed with psychiatric provider who evaluated the patient via televisit in the emergency department.  Patient meets inpatient criteria for psychiatric admission and is awaiting placement for psych admission at this time.  Risk OTC drugs. Prescription drug management.   Thomas [name] voiced understanding with medical evaluation and treatment plan.  He continues to state that he would like to be allowed to go home.  Patient under psychiatric hold at this time.  This chart was dictated using voice recognition software, Dragon. Despite the best efforts of this provider to proofread and correct errors, errors may still occur which can change documentation meaning.  Final Clinical Impression(s) / ED Diagnoses Final diagnoses:  None    Rx / DC Orders ED Discharge Orders     None         Aura Dials 01/21/21 [id number]    Fatima Blank, MD 01/21/21 [id number]

## 2021-01-21 NOTE — BH Assessment (Signed)
[facility] Assessment Progress Note   Per Merlyn Lot, NP, this pt does not require psychiatric hospitalization at this time.  Pt presents under IVC initiated by pt's mother and upheld by EDP Addison Lank, MD, which has been rescinded by Hampton Abbot, MD.  Pt is psychiatrically cleared.  Discharge instructions include pt's next scheduled appointment with Maggie Font, NP at [facility], but also advise pt to present there, per [facility] recommendation, within the next 72 hours for medication management.  EDP Lacretia Leigh, MD and pt's nurse, Darnelle Maffucci, have been notified.  Jalene Mullet, [place] Triage Specialist [phone]

## 2021-01-21 NOTE — ED Notes (Signed)
Lunch tray provided. 

## 2021-01-21 NOTE — ED Notes (Signed)
Pt in bed, pt states that he is feeling very anxious, pt requests something for anxiety, md notified

## 2021-01-21 NOTE — ED Notes (Signed)
Moved to a private room for TTS. TTS in progress.

## 2021-01-21 NOTE — ED Notes (Signed)
Pt in bed, pt denies pain at this time, pt denies si or hi at this time, states that his mom and grandmother are coming to get him.  Pt verbalized understanding d/c instructions and follow up.  One bag of belongings returned to pt, pt states that this is all of his stuff.

## 2021-01-31 ENCOUNTER — Encounter (HOSPITAL_COMMUNITY): Payer: Self-pay | Admitting: Psychiatry

## 2021-01-31 ENCOUNTER — Other Ambulatory Visit: Payer: Self-pay

## 2021-01-31 ENCOUNTER — Emergency Department (HOSPITAL_COMMUNITY)
Admission: EM | Admit: 2021-01-31 | Discharge: 2021-02-03 | Disposition: A | Discharge status: Home / Self Care | Payer: Self-pay | Attending: Emergency Medicine | Admitting: Emergency Medicine

## 2021-01-31 ENCOUNTER — Ambulatory Visit (HOSPITAL_COMMUNITY)
Admission: EM | Admit: 2021-01-31 | Discharge: 2021-01-31 | Disposition: A | Discharge status: Other Acute Inpatient Hospital | Payer: No Payment, Other | Attending: Family | Admitting: Family

## 2021-01-31 ENCOUNTER — Emergency Department (HOSPITAL_COMMUNITY)
Admission: EM | Admit: 2021-01-31 | Discharge: 2021-01-31 | Disposition: A | Discharge status: Home / Self Care | Payer: Self-pay | Attending: Emergency Medicine | Admitting: Emergency Medicine

## 2021-01-31 ENCOUNTER — Telehealth (INDEPENDENT_AMBULATORY_CARE_PROVIDER_SITE_OTHER): Payer: No Payment, Other | Admitting: Psychiatry

## 2021-01-31 DIAGNOSIS — F419 Anxiety disorder, unspecified: Secondary | ICD-10-CM

## 2021-01-31 DIAGNOSIS — U071 COVID-19: Secondary | ICD-10-CM | POA: Insufficient documentation

## 2021-01-31 DIAGNOSIS — R45851 Suicidal ideations: Secondary | ICD-10-CM | POA: Insufficient documentation

## 2021-01-31 DIAGNOSIS — F1024 Alcohol dependence with alcohol-induced mood disorder: Secondary | ICD-10-CM | POA: Diagnosis not present

## 2021-01-31 DIAGNOSIS — F411 Generalized anxiety disorder: Secondary | ICD-10-CM | POA: Diagnosis present

## 2021-01-31 DIAGNOSIS — F339 Major depressive disorder, recurrent, unspecified: Secondary | ICD-10-CM | POA: Insufficient documentation

## 2021-01-31 DIAGNOSIS — T43596A Underdosing of other antipsychotics and neuroleptics, initial encounter: Secondary | ICD-10-CM | POA: Insufficient documentation

## 2021-01-31 DIAGNOSIS — Z79899 Other long term (current) drug therapy: Secondary | ICD-10-CM | POA: Insufficient documentation

## 2021-01-31 DIAGNOSIS — Z91128 Patient's intentional underdosing of medication regimen for other reason: Secondary | ICD-10-CM | POA: Insufficient documentation

## 2021-01-31 DIAGNOSIS — F332 Major depressive disorder, recurrent severe without psychotic features: Secondary | ICD-10-CM | POA: Diagnosis present

## 2021-01-31 DIAGNOSIS — F1014 Alcohol abuse with alcohol-induced mood disorder: Secondary | ICD-10-CM

## 2021-01-31 DIAGNOSIS — F1010 Alcohol abuse, uncomplicated: Secondary | ICD-10-CM | POA: Insufficient documentation

## 2021-01-31 DIAGNOSIS — K709 Alcoholic liver disease, unspecified: Secondary | ICD-10-CM | POA: Insufficient documentation

## 2021-01-31 DIAGNOSIS — R000 Tachycardia, unspecified: Secondary | ICD-10-CM | POA: Insufficient documentation

## 2021-01-31 DIAGNOSIS — F1020 Alcohol dependence, uncomplicated: Secondary | ICD-10-CM | POA: Diagnosis present

## 2021-01-31 DIAGNOSIS — F10929 Alcohol use, unspecified with intoxication, unspecified: Secondary | ICD-10-CM

## 2021-01-31 DIAGNOSIS — F1994 Other psychoactive substance use, unspecified with psychoactive substance-induced mood disorder: Secondary | ICD-10-CM | POA: Diagnosis present

## 2021-01-31 DIAGNOSIS — T43226A Underdosing of selective serotonin reuptake inhibitors, initial encounter: Secondary | ICD-10-CM | POA: Insufficient documentation

## 2021-01-31 LAB — RAPID URINE DRUG SCREEN, HOSP PERFORMED
Amphetamines: NOT DETECTED
Barbiturates: NOT DETECTED
Benzodiazepines: NOT DETECTED
Cocaine: NOT DETECTED
Opiates: NOT DETECTED
Tetrahydrocannabinol: POSITIVE — AB

## 2021-01-31 LAB — CBC WITH DIFFERENTIAL/PLATELET
Abs Immature Granulocytes: 0.03 10*3/uL (ref 0.00–0.07)
Abs Immature Granulocytes: 0.04 10*3/uL (ref 0.00–0.07)
Basophils Absolute: 0.1 10*3/uL (ref 0.0–0.1)
Basophils Absolute: 0.1 10*3/uL (ref 0.0–0.1)
Basophils Relative: 1 %
Basophils Relative: 0 %
Eosinophils Absolute: 0 10*3/uL (ref 0.0–0.5)
Eosinophils Absolute: 0 10*3/uL (ref 0.0–0.5)
Eosinophils Relative: 0 %
Eosinophils Relative: 0 %
HCT: 48.5 % (ref 39.0–52.0)
HCT: 46.5 % (ref 39.0–52.0)
Hemoglobin: 17.1 g/dL — ABNORMAL HIGH (ref 13.0–17.0)
Hemoglobin: 16.8 g/dL (ref 13.0–17.0)
Immature Granulocytes: 0 %
Immature Granulocytes: 0 %
Lymphocytes Relative: 14 %
Lymphocytes Relative: 12 %
Lymphs Abs: 1.2 10*3/uL (ref 0.7–4.0)
Lymphs Abs: 1.4 10*3/uL (ref 0.7–4.0)
MCH: 31.4 pg (ref 26.0–34.0)
MCH: 32.1 pg (ref 26.0–34.0)
MCHC: 35.3 g/dL (ref 30.0–36.0)
MCHC: 36.1 g/dL — ABNORMAL HIGH (ref 30.0–36.0)
MCV: 89.2 fL (ref 80.0–100.0)
MCV: 88.9 fL (ref 80.0–100.0)
Monocytes Absolute: 0.8 10*3/uL (ref 0.1–1.0)
Monocytes Absolute: 0.9 10*3/uL (ref 0.1–1.0)
Monocytes Relative: 8 %
Monocytes Relative: 8 %
Neutro Abs: 7 10*3/uL (ref 1.7–7.7)
Neutro Abs: 9.3 10*3/uL — ABNORMAL HIGH (ref 1.7–7.7)
Neutrophils Relative %: 77 %
Neutrophils Relative %: 80 %
Platelets: 305 10*3/uL (ref 150–400)
Platelets: 296 10*3/uL (ref 150–400)
RBC: 5.44 MIL/uL (ref 4.22–5.81)
RBC: 5.23 MIL/uL (ref 4.22–5.81)
RDW: 12.8 % (ref 11.5–15.5)
RDW: 12.7 % (ref 11.5–15.5)
WBC: 9.1 10*3/uL (ref 4.0–10.5)
WBC: 11.8 10*3/uL — ABNORMAL HIGH (ref 4.0–10.5)
nRBC: 0 % (ref 0.0–0.2)
nRBC: 0 % (ref 0.0–0.2)

## 2021-01-31 LAB — COMPREHENSIVE METABOLIC PANEL
ALT: 342 U/L — ABNORMAL HIGH (ref 0–44)
ALT: 318 U/L — ABNORMAL HIGH (ref 0–44)
AST: 424 U/L — ABNORMAL HIGH (ref 15–41)
AST: 300 U/L — ABNORMAL HIGH (ref 15–41)
Albumin: 4.5 g/dL (ref 3.5–5.0)
Albumin: 4.5 g/dL (ref 3.5–5.0)
Alkaline Phosphatase: 92 U/L (ref 38–126)
Alkaline Phosphatase: 97 U/L (ref 38–126)
Anion gap: 14 (ref 5–15)
Anion gap: 18 — ABNORMAL HIGH (ref 5–15)
BUN: 5 mg/dL — ABNORMAL LOW (ref 6–20)
BUN: 8 mg/dL (ref 6–20)
CO2: 25 mmol/L (ref 22–32)
CO2: 22 mmol/L (ref 22–32)
Calcium: 9.4 mg/dL (ref 8.9–10.3)
Calcium: 9.3 mg/dL (ref 8.9–10.3)
Chloride: 96 mmol/L — ABNORMAL LOW (ref 98–111)
Chloride: 94 mmol/L — ABNORMAL LOW (ref 98–111)
Creatinine, Ser: 0.71 mg/dL (ref 0.61–1.24)
Creatinine, Ser: 0.81 mg/dL (ref 0.61–1.24)
GFR, Estimated: >60 mL/min (ref >60)
GFR, Estimated: >60 mL/min (ref >60)
Glucose, Bld: 113 mg/dL — ABNORMAL HIGH (ref 70–99)
Glucose, Bld: 138 mg/dL — ABNORMAL HIGH (ref 70–99)
Potassium: 3.9 mmol/L (ref 3.5–5.1)
Potassium: 3.5 mmol/L (ref 3.5–5.1)
Sodium: 135 mmol/L (ref 135–145)
Sodium: 134 mmol/L — ABNORMAL LOW (ref 135–145)
Total Bilirubin: 0.8 mg/dL (ref 0.3–1.2)
Total Bilirubin: 1 mg/dL (ref 0.3–1.2)
Total Protein: 8 g/dL (ref 6.5–8.1)
Total Protein: 8.1 g/dL (ref 6.5–8.1)

## 2021-01-31 LAB — LIPID PANEL
Cholesterol: 208 mg/dL — ABNORMAL HIGH (ref 0–200)
HDL: 91 mg/dL (ref >40)
LDL Cholesterol: 101 mg/dL — ABNORMAL HIGH (ref 0–99)
Total CHOL/HDL Ratio: 2.3 RATIO
Triglycerides: 80 mg/dL (ref <150)
VLDL: 16 mg/dL (ref 0–40)

## 2021-01-31 LAB — RESP PANEL BY RT-PCR (FLU A&B, COVID) ARPGX2
Influenza A by PCR: NEGATIVE
Influenza A by PCR: NEGATIVE
Influenza B by PCR: NEGATIVE
Influenza B by PCR: NEGATIVE
SARS Coronavirus 2 by RT PCR: POSITIVE — AB
SARS Coronavirus 2 by RT PCR: POSITIVE — AB

## 2021-01-31 LAB — POCT URINE DRUG SCREEN - MANUAL ENTRY (I-SCREEN)
POC Amphetamine UR: NOT DETECTED
POC Buprenorphine (BUP): NOT DETECTED
POC Cocaine UR: NOT DETECTED
POC Marijuana UR: POSITIVE — AB
POC Methadone UR: NOT DETECTED
POC Methamphetamine UR: NOT DETECTED
POC Morphine: NOT DETECTED
POC Oxazepam (BZO): NOT DETECTED
POC Oxycodone UR: NOT DETECTED
POC Secobarbital (BAR): NOT DETECTED

## 2021-01-31 LAB — MAGNESIUM: Magnesium: 2.3 mg/dL (ref 1.7–2.4)

## 2021-01-31 LAB — POC SARS CORONAVIRUS 2 AG -  ED: SARS Coronavirus 2 Ag: NEGATIVE

## 2021-01-31 LAB — ACETAMINOPHEN LEVEL: Acetaminophen (Tylenol), Serum: <10 ug/mL — ABNORMAL LOW (ref 10–30)

## 2021-01-31 LAB — HEMOGLOBIN A1C
Hgb A1c MFr Bld: 5.2 % (ref 4.8–5.6)
Mean Plasma Glucose: 102.54 mg/dL

## 2021-01-31 LAB — SALICYLATE LEVEL: Salicylate Lvl: <7 mg/dL — ABNORMAL LOW (ref 7.0–30.0)

## 2021-01-31 LAB — ETHANOL
Alcohol, Ethyl (B): 254 mg/dL — ABNORMAL HIGH (ref <10)
Alcohol, Ethyl (B): 197 mg/dL — ABNORMAL HIGH (ref <10)

## 2021-01-31 LAB — TSH: TSH: 0.569 u[IU]/mL (ref 0.350–4.500)

## 2021-01-31 MED ORDER — THIAMINE HCL 100 MG/ML IJ SOLN: 100.0000 mg | Freq: Every day | INTRAMUSCULAR | Status: DC

## 2021-01-31 MED ORDER — LOPERAMIDE HCL 2 MG PO CAPS: 2.0000 mg | ORAL_CAPSULE | ORAL | Status: DC | PRN

## 2021-01-31 MED ORDER — THIAMINE HCL 100 MG PO TABS: 100.0000 mg | ORAL_TABLET | Freq: Every day | ORAL | Status: DC

## 2021-01-31 MED ORDER — LORAZEPAM 2 MG/ML IJ SOLN: 0.0000 mg | Freq: Four times a day (QID) | INTRAMUSCULAR | Status: AC

## 2021-01-31 MED ORDER — LORAZEPAM 1 MG PO TABS: 1.0000 mg | ORAL_TABLET | Freq: Four times a day (QID) | ORAL | Status: DC | PRN

## 2021-01-31 MED ORDER — ONDANSETRON 4 MG PO TBDP: 4.0000 mg | ORAL_TABLET | Freq: Four times a day (QID) | ORAL | Status: DC | PRN

## 2021-01-31 MED ORDER — RISPERIDONE 1 MG PO TBDP
2.0000 mg | ORAL_TABLET | Freq: Three times a day (TID) | ORAL | Status: DC | PRN
  Administered 2021-02-01 – 2021-02-02 (×2): 2 mg via ORAL
  Filled 2021-01-31 (×3): qty 2

## 2021-01-31 MED ORDER — ADULT MULTIVITAMIN W/MINERALS CH
1.0000 | ORAL_TABLET | Freq: Every day | ORAL | Status: DC
  Administered 2021-01-31: 1 via ORAL
  Filled 2021-01-31: qty 1

## 2021-01-31 MED ORDER — ACETAMINOPHEN 325 MG PO TABS: 650.0000 mg | ORAL_TABLET | Freq: Four times a day (QID) | ORAL | Status: DC | PRN

## 2021-01-31 MED ORDER — ZIPRASIDONE MESYLATE 20 MG IM SOLR: 20.0000 mg | INTRAMUSCULAR | Status: DC | PRN

## 2021-01-31 MED ORDER — LORAZEPAM 1 MG PO TABS
1.0000 mg | ORAL_TABLET | ORAL | Status: AC | PRN
  Administered 2021-02-03: 1 mg via ORAL
  Filled 2021-01-31: qty 1

## 2021-01-31 MED ORDER — LORAZEPAM 1 MG PO TABS
0.0000 mg | ORAL_TABLET | Freq: Four times a day (QID) | ORAL | Status: AC
  Administered 2021-02-01 – 2021-02-02 (×2): 1 mg via ORAL
  Filled 2021-01-31 (×3): qty 1

## 2021-01-31 MED ORDER — TRAZODONE HCL 50 MG PO TABS: 50.0000 mg | ORAL_TABLET | Freq: Every evening | ORAL | Status: DC | PRN

## 2021-01-31 MED ORDER — MAGNESIUM HYDROXIDE 400 MG/5ML PO SUSP: 30.0000 mL | Freq: Every day | ORAL | Status: DC | PRN

## 2021-01-31 MED ORDER — LORAZEPAM 1 MG PO TABS: 1.0000 mg | ORAL_TABLET | Freq: Two times a day (BID) | ORAL | Status: DC

## 2021-01-31 MED ORDER — HYDROXYZINE HCL 25 MG PO TABS
25.0000 mg | ORAL_TABLET | Freq: Once | ORAL | Status: AC
  Administered 2021-01-31: 25 mg via ORAL
  Filled 2021-01-31: qty 1

## 2021-01-31 MED ORDER — THIAMINE HCL 100 MG PO TABS
100.0000 mg | ORAL_TABLET | Freq: Every day | ORAL | Status: DC
  Administered 2021-01-31 – 2021-02-03 (×4): 100 mg via ORAL
  Filled 2021-01-31 (×4): qty 1

## 2021-01-31 MED ORDER — HYDROXYZINE HCL 25 MG PO TABS: 25.0000 mg | ORAL_TABLET | Freq: Three times a day (TID) | ORAL | Status: DC | PRN

## 2021-01-31 MED ORDER — ALUM & MAG HYDROXIDE-SIMETH 200-200-20 MG/5ML PO SUSP: 30.0000 mL | ORAL | Status: DC | PRN

## 2021-01-31 MED ORDER — LORAZEPAM 1 MG PO TABS: 1.0000 mg | ORAL_TABLET | Freq: Three times a day (TID) | ORAL | Status: DC

## 2021-01-31 MED ORDER — LORAZEPAM 2 MG/ML IJ SOLN: 0.0000 mg | Freq: Two times a day (BID) | INTRAMUSCULAR | Status: DC

## 2021-01-31 MED ORDER — THIAMINE HCL 100 MG/ML IJ SOLN
100.0000 mg | Freq: Once | INTRAMUSCULAR | Status: AC
  Administered 2021-01-31: 100 mg via INTRAMUSCULAR
  Filled 2021-01-31: qty 2

## 2021-01-31 MED ORDER — LORAZEPAM 1 MG PO TABS: 0.0000 mg | ORAL_TABLET | Freq: Two times a day (BID) | ORAL | Status: DC

## 2021-01-31 MED ORDER — LORAZEPAM 1 MG PO TABS
1.0000 mg | ORAL_TABLET | Freq: Four times a day (QID) | ORAL | Status: DC
  Administered 2021-01-31: 1 mg via ORAL
  Filled 2021-01-31: qty 1

## 2021-01-31 MED ORDER — DIPHENHYDRAMINE HCL 25 MG PO CAPS
25.0000 mg | ORAL_CAPSULE | ORAL | Status: DC | PRN
  Administered 2021-01-31 – 2021-02-03 (×2): 25 mg via ORAL
  Filled 2021-01-31 (×3): qty 1

## 2021-01-31 MED ORDER — LORAZEPAM 1 MG PO TABS: 1.0000 mg | ORAL_TABLET | Freq: Every day | ORAL | Status: DC

## 2021-01-31 NOTE — ED Notes (Signed)
Pt oriented to unit. Pt offered food and fluids, declined. Will continue to monitor for safety.

## 2021-01-31 NOTE — Progress Notes (Signed)
[facility] MD/PA/NP OP Progress Note Virtual Visit via Telephone Note  I connected with Thomas [name] on 01/31/21 at  8:00 AM EST by telephone and verified that I am speaking with the correct person using two identifiers.  Location: Patient: home Provider: Clinic   I discussed the limitations, risks, security and privacy concerns of performing an evaluation and management service by telephone and the availability of in person appointments. I also discussed with the patient that there may be a patient responsible charge related to this service. The patient expressed understanding and agreed to proceed.   I provided 30 minutes of non-face-to-face time during this encounter.  01/31/2021 8:40 AM Thomas [name]  MRN:  [id number]  Chief Complaint: "I'm not doing well" Joy mother "Linas wants to harm himself"  HPI:  31 year old male seen today for follow-up psychiatric evaluation.  Patient was recently seen at [facility] on 01/20/2021 through 01/21/2021 when he presented with suicidal ideation, substance use (alcohol), and worsening depression.  Patient has a psychiatric history of generalized anxiety, depression, insomnia, and substance-induced mood disorder.  He was managed on Zoloft 25 mg daily, gabapentin 100 mg 3 times daily, Seroquel 50 mg nightly, hydroxyzine 10 mg 3 times daily as needed, and Klonopin 1 mg 3 times daily as needed (this was started at [facility] in November with a plan to taper in January).  Patient notes that he has not been compliant with his medications.   Today patient was unable to login virtually so assessment is over the phone.  During exam patient was irritable, easily distracted, but somewhat engaged in conversation.  He informed Probation officer that he is not doing well.  He reports that he is having symptoms of psychosis noting that he sees dark figures and hears voices.  He also informed Probation officer that he has been drinking 24 alcoholic beverages daily.  Patient mom was on the phone  doing his exam and she notes that he has been threatening to harm himself.  She reports that she wants to IVC him.  Provider encouraged patient to come [facility] behavioral health urgent care for stabilization.  He was agreeable to this.  Provider informed patient that if he did not, he would be IVC.  He endorsed understanding and agreed.    Patient notes that his sleep continues to be poor.  He reports that he sleeps less than 2 hours nightly.  He endorses having a poor appetite.  A PHQ-9 was done at [facility] on 01/21/2021 and patient scored a 23.  He endorsed being severely anxious noting that he feels that his mother will call the cops on him.  Patient notes that since his last visit he has lost over 10 pounds.   Provider discussed patient coming in to [facility] with Dr. Eduard Clos and fellow providers in the clinic.  Providers are expecting his arrival.  No medication changes made today.  Patient will follow-up after stabilization.  No other concerns at this time.       Visit Diagnosis:    ICD-10-CM   1. Alcohol dependence with alcohol-induced mood disorder ([place])  F10.24     2. Generalized anxiety disorder  F41.1       Past Psychiatric History: SI, generalized anxiety, depression, insomnia, and substance-induced mood disorder  Past Medical History:  Past Medical History:  Diagnosis Date   Anxiety     Past Surgical History:  Procedure Laterality Date   APPENDECTOMY      Family Psychiatric History: Maternal grandfather anxiety and  mother anxiety    Family History: No family history on file.  Social History:  Social History   Socioeconomic History   Marital status: Single    Spouse name: Not on file   Number of children: Not on file   Years of education: Not on file   Highest education level: Not on file  Occupational History   Not on file  Tobacco Use   Smoking status: Never   Smokeless tobacco: Never  Vaping Use   Vaping Use: Never used  Substance and Sexual Activity    Alcohol use: Yes    Comment: reported 20+ beers/day   Drug use: No   Sexual activity: Yes  Other Topics Concern   Not on file  Social History Narrative   Not on file   Social Determinants of Health   Financial Resource Strain: Not on file  Food Insecurity: Not on file  Transportation Needs: Not on file  Physical Activity: Not on file  Stress: Not on file  Social Connections: Not on file    Allergies: No Known Allergies  Metabolic Disorder Labs: Lab Results  Component Value Date   HGBA1C 5.1 11/18/2019   MPG 99.67 11/18/2019   No results found for: PROLACTIN Lab Results  Component Value Date   CHOL 255 (H) 11/18/2019   TRIG 107 11/18/2019   HDL 90 11/18/2019   CHOLHDL 2.8 11/18/2019   VLDL 21 11/18/2019   LDLCALC 144 (H) 11/18/2019   Lab Results  Component Value Date   TSH 1.422 11/18/2019   TSH 0.766 01/28/2012    Therapeutic Level Labs: No results found for: LITHIUM No results found for: VALPROATE No components found for:  CBMZ  Current Medications: Current Outpatient Medications  Medication Sig Dispense Refill   BENADRYL ALLERGY 25 MG capsule Take 25 mg by mouth as needed for sleep.     clonazePAM (KLONOPIN) 1 MG tablet Take 1 tablet (1 mg total) by mouth 3 (three) times daily. (Patient not taking: Reported on 01/21/2021) 30 tablet 0   QUEtiapine (SEROQUEL) 100 MG tablet TAKE 1 TABLET (100 MG TOTAL) BY MOUTH AT BEDTIME. (Patient not taking: Reported on 01/21/2021) 30 tablet 0   sertraline (ZOLOFT) 50 MG tablet Take 1 tablet (50 mg total) by mouth daily. 30 tablet 0   traZODone (DESYREL) 100 MG tablet TAKE 1 TABLET (100 MG TOTAL) BY MOUTH AT BEDTIME AS NEEDED FOR SLEEP. (Patient not taking: Reported on 01/21/2021) 30 tablet 0   No current facility-administered medications for this visit.     Musculoskeletal: Strength & Muscle Tone:  Unable to assess due to telephone visit Gait & Station:  Unable to assess due to telephone visit Patient leans:  N/A  Psychiatric Specialty Exam: Review of Systems  There were no vitals taken for this visit.There is no height or weight on file to calculate BMI.  General Appearance:  Unable to assess due to telephone visit  Eye Contact:   unable to assess due to telephone visit  Speech:  Clear and Coherent and Normal Rate  Volume:  Normal  Mood:  Anxious, Depressed, and Irritable  Affect:   Unable to assess due to telephone visit  Thought Process:  Coherent, Goal Directed, and Linear  Orientation:  Full (Time, Place, and Person)  Thought Content: Logical and Hallucinations: Auditory Visual   Suicidal Thoughts:  Yes.  with intent/plan  Homicidal Thoughts:  No  Memory:  Immediate;   Good Recent;   Good Remote;   Good  Judgement:  Impaired  Insight:  Fair and Lacking  Psychomotor Activity:   Unable to assess due to telephone visit  Concentration:  Concentration: Fair and Attention Span: Fair  Recall:  [place] of Knowledge: Good  Language: Good  Akathisia:   Unable to assess due to telephone visit  Handed:  Right  AIMS (if indicated): not done  Assets:  Communication Skills Desire for Improvement Financial Resources/Insurance Housing Social Support  ADL's:  Intact  Cognition: WNL  Sleep:  Poor   Screenings: [place] Admission (Discharged) from 11/17/2019 in [facility]  AIMS Total Score 0      AUDIT    Flowsheet Row Admission (Discharged) from 11/17/2019 in [facility] Office Visit from 10/30/2019 in [facility]  Alcohol Use Disorder Identification Test Final Score (AUDIT) 40 [place] Office Visit from 10/30/2019 in [facility] Score 4      GAD-7    [employer] Office Visit from 10/30/2019 in [facility]  Total GAD-7 Score 21      PHQ2-9    [facility] ED  from 01/20/2021 in [facility] DEPT Office Visit from 10/30/2019 in [facility]  PHQ-2 Total Score 6 6  PHQ-9 Total Score 23 26      [facility] ED from 01/20/2021 in [facility] DEPT Admission (Discharged) from 11/17/2019 in [facility] ED from 11/16/2019 in [facility] DEPT  C-SSRS RISK CATEGORY No Risk High Risk Error: Q3, 4, or 5 should not be populated when Q2 is No        Assessment and Plan: Patient endorses symptoms of substance-induced mood disorder (alcohol) and anxiety.  Patient agreeable to walk-in to [facility] for evaluation and medication management.  At this time no medication changes made today.  He will follow-up upon discharge from inpatient hospitalization.   1. Alcohol dependence with alcohol-induced mood disorder ([place])   2. Generalized anxiety disorder  Follow-up upon discharge from inpatient hospitalization.   Salley Slaughter, NP 01/31/2021, 8:40 AM

## 2021-01-31 NOTE — ED Provider Triage Note (Signed)
Emergency Medicine Provider Triage Evaluation Note  Thomas [name] , a 31 y.o. male  was evaluated in triage.  Pt complains of anxiety.  Patient was discharged to [facility] earlier today after being medically cleared. [facility] then refused him because he had a positive COVID test. He is now here under IVC order from his family. No suicidal ideations, homicidal ideations, auditory or visual hallucinations.   Review of Systems  Positive:  Negative:   Physical Exam  BP (!) 149/101    Pulse (!) 115    Temp 98.7 F (37.1 C) (Oral)    Resp 16    SpO2 96%  Gen:   Awake, no distress   Resp:  Normal effort  MSK:   Moves extremities without difficulty  Other:    Medical Decision Making  Medically screening exam initiated at 7:55 PM.  Appropriate orders placed.  Thomas [name] was informed that the remainder of the evaluation will be completed by another provider, this initial triage assessment does not replace that evaluation, and the importance of remaining in the ED until their evaluation is complete.     Honor Loh Christmas, [place] 01/31/21 1958

## 2021-01-31 NOTE — ED Provider Notes (Signed)
Patient  at [facility] behavioral health awaiting inpatient psychiatric hospitalization. COVID screen positive.  Patient will be transported to [facility] emergency department while awaiting inpatient psychiatric hospitalization. Damaine made aware of current treatment plan, verbalizes understanding. Patient accepted to [facility] emergency department by Dr. Roslynn Amble.  Patient reviewed with Dr. Serafina Mitchell.

## 2021-01-31 NOTE — BH Assessment (Signed)
Christia A. Hillman, UrgentElvera Lennox #[id number]; 31 year old presents this date with his mother Yuvraj Pfeifer [phone].  Pt reports SI, without a specify plan three days ago.  Pt denies HI or AVH  Pt reports prior MH diagnosis or prescribed medication for symptom management,.  MSE signed by patient.

## 2021-01-31 NOTE — ED Notes (Signed)
Patient approached nurses station requesting to make phone call, RN explained patient is allowed 2 5-minute phone calls a day. Patient verbalized understanding.

## 2021-01-31 NOTE — ED Provider Notes (Addendum)
Received phone call from patient's mother, Vedanth Sirico, [phone]. Patient discharged from emergency department. Patient's mother requests update regarding treatment plan.  Spoke with Ario and his mother, [name], to discuss current plan including IVC.   Patient transported to [facility] earlier today. Patient COVID screen positive, patient not appropriate to remain in observation area at [facility]  Plan included transportation  to [facility] emergency department to await inpatient psychiatric hospitalization. Patient status voluntary when transported to [facility] emergency department.  Patient reviewed with Dr Bronwen Betters. Taige petitioned for Involuntary commitment by this Clinical research associate related to suicidal ideations with plan to hang himself reported earlier this date.   Patient will be transported to emergency department by law enforcement.  Dr Nile Dear ED made aware.

## 2021-01-31 NOTE — Discharge Instructions (Addendum)
You are medically cleared.  Please go back to psychiatric behavior health for further evaluation.  You can return to the emergency department for any concerns you have.

## 2021-01-31 NOTE — ED Triage Notes (Signed)
Pt presents under IVC after [facility] refused pt d/t covid status. Pt seen earlier today for anxiety, continued issues w same. NAD in triage, calm & cooperative, denies SI/HI

## 2021-01-31 NOTE — ED Notes (Signed)
Pt in burgandy scrubs, wanded by security, belongings in locker #2

## 2021-01-31 NOTE — ED Notes (Signed)
Sent ivc to [facility] , gave nurse copies

## 2021-01-31 NOTE — Progress Notes (Signed)
Thomas [name] was given a face mask after his covid test from [facility] was positive. The NP was notified

## 2021-01-31 NOTE — ED Triage Notes (Signed)
Patient sent to [facility] from [facility], patient presented to [facility] for anxiety. Denies SI/HI. Was sent to [facility] because he is COVID+, has no respiratory symptoms and is afebrile.
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
## 2021-01-31 NOTE — Progress Notes (Signed)
Thomas [name] was transported via Non emergent EMS to [facility]. Report was given to the charge nurse at [facility]

## 2021-01-31 NOTE — ED Provider Notes (Signed)
[facility] EMERGENCY DEPARTMENT Provider Note   CSN: [id number] Arrival date & time: 01/31/21  1505     History Chief Complaint  Patient presents with   Medical Clearance    Lennon A Serpa is a 31 y.o. male who presents to the emergency department for medical clearance.  Patient states that he was at psychiatric urgent care for anxiety.  They did a COVID swab on him which was positive and he was sent here for further evaluation.  Patient is currently asymptomatic.  He states his mother had COVID last week.  Again, patient has no symptoms.  HPI     Home Medications Prior to Admission medications   Medication Sig Start Date End Date Taking? Authorizing Provider  BENADRYL ALLERGY 25 MG capsule Take 25 mg by mouth as needed for sleep.    [provider]  gabapentin (NEURONTIN) 100 MG capsule Take 1 capsule (100 mg total) by mouth 3 (three) times daily. 11/08/19 11/21/19  Shanna Cisco, NP      Allergies    Patient has no known allergies.    Review of Systems   Review of Systems  All other systems reviewed and are negative.  Physical Exam Updated Vital Signs BP 131/88    Pulse (!) 118    Resp 18    SpO2 97%  Physical Exam Vitals and nursing note reviewed.  Constitutional:      General: He is not in acute distress.    Appearance: Normal appearance.  HENT:     Head: Normocephalic and atraumatic.  Eyes:     General:        Right eye: No discharge.        Left eye: No discharge.  Cardiovascular:     Comments: Regular rate and rhythm.  S1/S2 are distinct without any evidence of murmur, rubs, or gallops.  Radial pulses are 2+ bilaterally.  Dorsalis pedis pulses are 2+ bilaterally.  No evidence of pedal edema. Pulmonary:     Comments: Clear to auscultation bilaterally.  Normal effort.  No respiratory distress.  No evidence of wheezes, rales, or rhonchi heard throughout. Abdominal:     General: Abdomen is flat. Bowel sounds are normal.  There is no distension.     Tenderness: There is no abdominal tenderness. There is no guarding or rebound.  Musculoskeletal:        General: Normal range of motion.     Cervical back: Neck supple.  Skin:    General: Skin is warm and dry.     Findings: No rash.  Neurological:     General: No focal deficit present.     Mental Status: He is alert.  Psychiatric:        Mood and Affect: Mood normal.        Behavior: Behavior normal.    ED Results / Procedures / Treatments   Labs (all labs ordered are listed, but only abnormal results are displayed) Labs Reviewed - No data to display  EKG None  Radiology No results found.  Procedures Procedures    Medications Ordered in ED Medications - No data to display  ED Course/ Medical Decision Making/ A&P                           Medical Decision Making  Aryan A Steffler is a 31 y.o. male who presents to the emergency department for medical clearance.  Patient is currently asymptomatic.  I  do not feel he is in need of emergent care.  He has no respiratory distress.  Vital signs are normal.  He is safe for discharge back [facility] for further evaluation.   Final Clinical Impression(s) / ED Diagnoses Final diagnoses:  COVID    Rx / DC Orders ED Discharge Orders     None         Jolyn Lent 01/31/21 1548    Milagros Loll, MD 02/01/21 1907

## 2021-01-31 NOTE — Progress Notes (Addendum)
Received Thomas [name] in the assessment room with his mother. He was cooperative with the admission process. The skin assessment was completed and he was relocated to lhe OBS area. He was medicated per order and offered nourishments. He remains extremely anxious, medicated and asked for more medication after 5 minutes. He is currently resting quietly in his chair bed with his eyes close.

## 2021-01-31 NOTE — ED Provider Notes (Signed)
Behavioral Health Admission H&P [facility])  Date: 01/31/21 Patient Name: Thomas [name] MRN: [id number] Chief Complaint:  Chief Complaint  Patient presents with   Anxiety      Diagnoses:  Final diagnoses:  MDD (major depressive disorder), recurrent severe, without psychosis ([place])  Alcohol abuse  Generalized anxiety disorder    HPI: Patient presents voluntarily to [facility] behavioral health for walk-in assessment.  Patient is accompanied by his mother, Deeric Knauf. Mendel encouraged by outpatient psychiatry provider, Burt Ek, to seek assessment at [facility] behavioral health today.  Patient states "I have a lot of anxiety, I was prescribed Seroquel and Zoloft last year but only took it for 2 weeks, I regret that now."  Patient reports he has not been compliant with medications for several months.  Thomas [name] shares that he has been using alcohol, between 25 and 30 twelve ounce  beers per day, daily, for the last 3 weeks.  He reports chronic alcohol abuse, worsening over the past 3 weeks.  He uses alcohol to treat his chronic anxiety.  He denies substance use aside from alcohol.  Patient reports symptoms of depression times several weeks including hopelessness and depressed mood.  He also endorses low energy and fatigue.  Patient and mother report patient slept very little last night.  He has experienced episodes of tearfulness over the last several weeks.  Patient reports recent stressors include girlfriend who broke up with him 3 weeks ago related to his alcohol use.  He has also recently been separated from his employment related to his alcohol use and interrupted attendance related to alcohol.  Thomas [name] has been diagnosed with major depressive disorder, generalized anxiety disorder and insomnia due to anxiety and fear.  He is followed by outpatient psychiatry at [facility] behavioral health.  He has not been seen for approximately 1 year, had scheduled  for follow-up this week.  He is not currently followed by outpatient counseling.  He endorses history of inpatient psychiatric hospitalization, most recent hospitalization in November 2021.  Patient is assessed face-to-face by nurse practitioner.  He is seated in assessment area, no acute distress.  He is alert and oriented, cooperative during assessment.  He presents with depressed mood, tearful affect.  He endorses suicidal ideation with a plan to "go into father's shed, attach a rope to a board or find somewhere to hang."   Patient denies homicidal ideations.He has normal speech and behavior.  He denies both auditory and visual hallucinations.  Patient is able to converse coherently with goal-directed thoughts.  He denies paranoia.  Objectively there is no evidence of psychosis/mania or delusional thinking.  Lory resides in [place] with his mother and father, he denies access to weapons.  He is currently not employed.  Patient endorses decreased sleep and appetite for several weeks.  Patient offered support and encouragement.  Patient's mother agrees with plan for inpatient psychiatric hospitalization.  She reports patient has discussed suicidal ideation along with plan for several consecutive days.   PHQ 2-9:  [place] ED from 01/20/2021 in [place] DEPT Office Visit from 10/30/2019 in [facility]  Thoughts that you would be better off dead, or of hurting yourself in some way Several days More than half the days  PHQ-9 Total Score 23 [place] [facility] from 01/31/2021 in [facility] from 01/20/2021 in [place] DEPT Admission (Discharged) [facility]  C-SSRS RISK CATEGORY Moderate Risk No Risk High Risk        Total Time spent with patient: 30 minutes  Musculoskeletal  Strength & Muscle Tone:  within normal limits Gait & Station: normal Patient leans: N/A  Psychiatric Specialty Exam  Presentation General Appearance: Disheveled  Eye Contact:Fair  Speech:Clear and Coherent; Normal Rate  Speech Volume:Normal  Handedness:Right   Mood and Affect  Mood:Anxious; Depressed; Hopeless; Labile  Affect:Depressed; Labile; Tearful   Thought Process  Thought Processes:Coherent; Linear; Goal Directed  Descriptions of Associations:Intact  Orientation:Full (Time, Place and Person)  Thought Content:Logical  Diagnosis of Schizophrenia or Schizoaffective disorder in past: No   Hallucinations:Hallucinations: None  Ideas of Reference:None  Suicidal Thoughts:Suicidal Thoughts: Yes, Active SI Active Intent and/or Plan: With Intent; With Plan; With Means to [place]  Homicidal Thoughts:Homicidal Thoughts: No   Sensorium  Memory:Immediate Good; Recent Fair  Judgment:Impaired  Insight:Shallow   Executive Functions  Concentration:Fair  Attention Span:Fair  [place]  Language:Good   Psychomotor Activity  Psychomotor Activity:Psychomotor Activity: Normal   Assets  Assets:Communication Skills; Housing; Intimacy; Leisure Time; Resilience; Social Support   Sleep  Sleep:Sleep: Poor   Nutritional Assessment (For OBS and FBC admissions only) Has the patient had a weight loss or gain of 10 pounds or more in the last 3 months?: Yes Has the patient had a decrease in food intake/or appetite?: Yes Does the patient have dental problems?: No Does the patient have eating habits or behaviors that may be indicators of an eating disorder including binging or inducing vomiting?: No Has the patient recently lost weight without trying?: 2.0 Has the patient been eating poorly because of a decreased appetite?: 1 Malnutrition Screening Tool Score: 3 Nutritional Assessment Referrals: Medication/Tx changes    Physical Exam ROS  Blood pressure  133/90, pulse (!) 103, temperature 98.6 F (37 C), temperature source Oral, resp. rate 18, SpO2 91 %. There is no height or weight on file to calculate BMI.  Past Psychiatric History: Major depressive disorder, recurrent, severe, without psychotic features, generalized anxiety disorder, insomnia, alcohol use disorder  Is the patient at risk to self? Yes  Has the patient been a risk to self in the past 6 months? Yes .    Has the patient been a risk to self within the distant past? yes   Is the patient a risk to others? No   Has the patient been a risk to others in the past 6 months? No   Has the patient been a risk to others within the distant past? No   Past Medical History:  Past Medical History:  Diagnosis Date   Anxiety     Past Surgical History:  Procedure Laterality Date   APPENDECTOMY      Family History: No family history on file.  Social History:  Social History   Socioeconomic History   Marital status: Single    Spouse name: Not on file   Number of children: Not on file   Years of education: Not on file   Highest education level: Not on file  Occupational History   Not on file  Tobacco Use   Smoking status: Never   Smokeless tobacco: Never  Vaping Use   Vaping Use: Never used  Substance and Sexual Activity   Alcohol use: Yes    Comment: reported 20+ beers/day   Drug use: No   Sexual activity: Yes  Other Topics Concern   Not  on file  Social History Narrative   Not on file   Social Determinants of Health   Financial Resource Strain: Not on file  Food Insecurity: Not on file  Transportation Needs: Not on file  Physical Activity: Not on file  Stress: Not on file  Social Connections: Not on file  Intimate Partner Violence: Not on file    SDOH:  SDOH Screenings   Alcohol Screen: Not on file  Depression (PHQ2-9): Medium Risk   PHQ-2 Score: 23  Financial Resource Strain: Not on file  Food Insecurity: Not on file  Housing: Not on file  Physical  Activity: Not on file  Social Connections: Not on file  Stress: Not on file  Tobacco Use: Low Risk    Smoking Tobacco Use: Never   Smokeless Tobacco Use: Never   Passive Exposure: Not on file  Transportation Needs: Not on file    Last Labs:  Admission on 01/20/2021, Discharged on 01/21/2021  Component Date Value Ref Range Status   Sodium 01/20/2021 137  135 - 145 mmol/L Final   Potassium 01/20/2021 3.8  3.5 - 5.1 mmol/L Final   Chloride 01/20/2021 96 (L)  98 - 111 mmol/L Final   CO2 01/20/2021 26  22 - 32 mmol/L Final   Glucose, Bld 01/20/2021 105 (H)  70 - 99 mg/dL Final   Glucose reference range applies only to samples taken after fasting for at least 8 hours.   BUN 01/20/2021 7  6 - 20 mg/dL Final   Creatinine, Ser 01/20/2021 0.80  0.61 - 1.24 mg/dL Final   Calcium 01/20/2021 9.0  8.9 - 10.3 mg/dL Final   Total Protein 01/20/2021 8.8 (H)  6.5 - 8.1 g/dL Final   Albumin 01/20/2021 4.8  3.5 - 5.0 g/dL Final   AST 01/20/2021 41  15 - 41 U/L Final   ALT 01/20/2021 39  0 - 44 U/L Final   Alkaline Phosphatase 01/20/2021 90  38 - 126 U/L Final   Total Bilirubin 01/20/2021 1.2  0.3 - 1.2 mg/dL Final   GFR, Estimated 01/20/2021 >60  >60 mL/min Final   Comment: (NOTE) Calculated using the CKD-EPI Creatinine Equation (2021)    Anion gap 01/20/2021 15  5 - 15 Final   Performed at [facility], [place] [address], [place], [place] [address]   Alcohol, Ethyl (B) 01/20/2021 231 (H)  <10 mg/dL Final   Comment: (NOTE) Lowest detectable limit for serum alcohol is 10 mg/dL.  For medical purposes only. Performed at [facility], [place] [address], [place], [place] [id number]    Salicylate Lvl [id number] <7.0 (L)  7.0 - 30.0 mg/dL Final   Performed at [place] [address], [place], [place] [address]   Acetaminophen (Tylenol), Serum 01/20/2021 <10 (L)  10 - 30 ug/mL Final   Comment: (NOTE) Therapeutic concentrations vary  significantly. A range of 10-30 ug/mL  may be an effective concentration for many patients. However, some  are best treated at concentrations outside of this range. Acetaminophen concentrations >150 ug/mL at 4 hours after ingestion  and >50 ug/mL at 12 hours after ingestion are often associated with  toxic reactions.  Performed at [facility], [place] [address] [place], [place] [address]    WBC 01/20/2021 11.3 (H)  4.0 - 10.5 K/uL Final   RBC 01/20/2021 5.57  4.22 - 5.81 MIL/uL Final   Hemoglobin 01/20/2021 17.7 (H)  13.0 - 17.0 g/dL Final   HCT 01/20/2021 49.6  39.0 - 52.0 % Final   MCV 01/20/2021 89.0  80.0 - 100.0 fL Final   MCH 01/20/2021 31.8  26.0 - 34.0 pg Final   MCHC 01/20/2021 35.7  30.0 - 36.0 g/dL Final   RDW 01/20/2021 12.6  11.5 - 15.5 % Final   Platelets 01/20/2021 400  150 - 400 K/uL Final   nRBC 01/20/2021 0.0  0.0 - 0.2 % Final   Performed at [facility], [place] [address] [place], [place] [address]   Opiates 01/20/2021 NONE DETECTED  NONE DETECTED Final   Cocaine 01/20/2021 NONE DETECTED  NONE DETECTED Final   Benzodiazepines 01/20/2021 NONE DETECTED  NONE DETECTED Final   Amphetamines 01/20/2021 NONE DETECTED  NONE DETECTED Final   Tetrahydrocannabinol 01/20/2021 POSITIVE (A)  NONE DETECTED Final   Barbiturates 01/20/2021 NONE DETECTED  NONE DETECTED Final   Comment: (NOTE) DRUG SCREEN FOR MEDICAL PURPOSES ONLY.  IF CONFIRMATION IS NEEDED FOR ANY PURPOSE, NOTIFY LAB WITHIN 5 DAYS.  LOWEST DETECTABLE LIMITS FOR URINE DRUG SCREEN Drug Class                     Cutoff (ng/mL) Amphetamine and metabolites    1000 Barbiturate and metabolites    200 Benzodiazepine                 [id number] Tricyclics and metabolites     300 Opiates and metabolites        300 Cocaine and metabolites        300 THC                            50 Performed at [facility], [place] [address], [place], [place] [address]    SARS  Coronavirus 2 by RT PCR 01/21/2021 NEGATIVE  NEGATIVE Final   Comment: (NOTE) SARS-CoV-2 target nucleic acids are NOT DETECTED.  The SARS-CoV-2 RNA is generally detectable in upper respiratory specimens during the acute phase of infection. The lowest concentration of SARS-CoV-2 viral copies this assay can detect is 138 copies/mL. A negative result does not preclude SARS-Cov-2 infection and should not be used as the sole basis for treatment or other patient management decisions. A negative result may occur with  improper specimen collection/handling, submission of specimen other than nasopharyngeal swab, presence of viral mutation(s) within the areas targeted by this assay, and inadequate number of viral copies(<138 copies/mL). A negative result must be combined with clinical observations, patient history, and epidemiological information. The expected result is Negative.  Fact Sheet for Patients:  EntrepreneurPulse.com.au  Fact Sheet for Healthcare Providers:  IncredibleEmployment.be  This test is no                          t yet approved or cleared by the [place] FDA and  has been authorized for detection and/or diagnosis of SARS-CoV-2 by FDA under an Emergency Use Authorization (EUA). This EUA will remain  in effect (meaning this test can be used) for the duration of the COVID-19 declaration under Section 564(b)(1) of the Act, 21 U.S.C.section 360bbb-3(b)(1), unless the authorization is terminated  or revoked sooner.       Influenza A by PCR 01/21/2021 NEGATIVE  NEGATIVE Final   Influenza B by PCR 01/21/2021 NEGATIVE  NEGATIVE Final   Comment: (NOTE) The Xpert Xpress SARS-CoV-2/FLU/RSV plus assay is intended as an aid in the diagnosis of influenza from Nasopharyngeal  swab specimens and should not be used as a sole basis for treatment. Nasal washings and aspirates are unacceptable for Xpert Xpress SARS-CoV-2/FLU/RSV testing.  Fact  Sheet for Patients: EntrepreneurPulse.com.au  Fact Sheet for Healthcare Providers: IncredibleEmployment.be  This test is not yet approved or cleared by the [place] FDA and has been authorized for detection and/or diagnosis of SARS-CoV-2 by FDA under an Emergency Use Authorization (EUA). This EUA will remain in effect (meaning this test can be used) for the duration of the COVID-19 declaration under Section 564(b)(1) of the Act, 21 U.S.C. section 360bbb-3(b)(1), unless the authorization is terminated or revoked.  Performed at [facility], [place] [address], [place], [place] [address]     Allergies: Patient has no known allergies.  PTA Medications: (Not in a hospital admission)   Medical Decision Making  Patient reviewed with Dr. Serafina [place].  He remains voluntary at this time.  He will be admitted to observation area at [facility] behavioral health while awaiting inpatient psychiatric hospitalization.  Laboratory studies ordered including CBC, CMP, ethanol, A1c, hepatic function, lipid panel, magnesium, prolactin and TSH.  Urine drug screen order initiated.  EKG ordered.  Current medications: -Acetaminophen 650 mg every 6 as needed/mild pain -Maalox 30 mL oral every 4 as needed/digestion -Hydroxyzine 25 mg 3 times daily as needed/anxiety -Magnesium hydroxide 30 mL daily as needed/mild constipation -Trazodone 50 mg nightly as needed/sleep  CIWA Ativan protocol initiated: -Loperamide 2 to 4 mg oral as needed/diarrhea or loose stools -Lorazepam 1 mg 4 times daily x4 doses, 1 mg 3 times daily for 3 doses, 1 mg 2 times daily x2 doses, 1 mg daily x1 dose -Lorazepam 1 mg every 6 hours as needed CIWA greater than 10 -Multivitamin with minerals 1 tablet daily -Ondansetron disintegrating tablet 4 mg every 6 as needed/nausea or vomiting -Thiamine injection 100 mg IV once -Thiamine tablet 100 mg daily  Additional  medications: -Sertraline 50 mg daily/mood     Recommendations  Based on my evaluation the patient does not appear to have an emergency medical condition.  Lucky Rathke, FNP 01/31/21  10:20 AM

## 2021-01-31 NOTE — ED Provider Notes (Signed)
[facility] Emergency Department Provider Note MRN:  [id number]  Arrival date & time: 01/31/21     Chief Complaint   No chief complaint on file.   History of Present Illness   Thomas KnucklesChristian A Wayne SeverHerrera is a 31 y.o. year-old male with a history of generalized anxiety disorder major depressive disorder presenting to the ED with chief complaint of COVID-positive.  The patient is a 31 year old male who initially presented to this emergency department after having severe anxiety and making suicidal threats.  He was medically cleared and then returned to the behavioral health clinic.  However the behavioral health clinic was unable to take him to their service due to the fact that he is COVID-positive  The patient states that he has had a known COVID-positive contact in the form of his mother.  Patient denies that he has had cough, congestion, fever, chills, shortness of breath, or chest pain.  As for the patient's anxiety and suicidality: He states that he has had increasing anxiety over the past few weeks.  During this time he has resorted to alcohol abuse.  The patient states that he made threats to hang himself.  Patient still has intentions to harm himself.  I was able to obtain collateral information from the patient's mother who reports that she feels that it would be unwise for him to return home due to the high likelihood of him trying to harm himself.  Review of Systems  A thorough review of systems was obtained and all systems are negative except as noted in the HPI and PMH.   Patient's Health History    Past Medical History:  Diagnosis Date   Anxiety     Past Surgical History:  Procedure Laterality Date   APPENDECTOMY      No family history on file.  Social History   Socioeconomic History   Marital status: Single    Spouse name: Not on file   Number of children: Not on file   Years of education: Not on file   Highest education level: Not on file   Occupational History   Not on file  Tobacco Use   Smoking status: Never   Smokeless tobacco: Never  Vaping Use   Vaping Use: Never used  Substance and Sexual Activity   Alcohol use: Yes    Comment: reported 20+ beers/day   Drug use: No   Sexual activity: Yes  Other Topics Concern   Not on file  Social History Narrative   Not on file   Social Determinants of Health   Financial Resource Strain: Not on file  Food Insecurity: Not on file  Transportation Needs: Not on file  Physical Activity: Not on file  Stress: Not on file  Social Connections: Not on file  Intimate Partner Violence: Not on file     Physical Exam   Physical Exam Vitals and nursing note reviewed.  Constitutional:      Appearance: Normal appearance.  Cardiovascular:     Rate and Rhythm: Regular rhythm. Tachycardia present.  Pulmonary:     Effort: Pulmonary effort is normal.     Breath sounds: Normal breath sounds.  Abdominal:     General: Abdomen is flat.     Palpations: Abdomen is soft.  Skin:    Capillary Refill: Capillary refill takes less than 2 seconds.     Findings: No rash.  Neurological:     General: No focal deficit present.     Mental Status: He is alert and  oriented to person, place, and time. Mental status is at baseline.  Psychiatric:        Attention and Perception: Attention and perception normal.        Mood and Affect: Mood is anxious and depressed.        Behavior: Behavior is cooperative.        Thought Content: Thought content is not paranoid or delusional. Thought content includes suicidal ideation. Thought content does not include homicidal ideation. Thought content includes suicidal plan.      Diagnostic and Interventional Summary     Labs Reviewed  RESP PANEL BY RT-PCR (FLU A&B, COVID) ARPGX2 - Abnormal; Notable for the following components:      Result Value   SARS Coronavirus 2 by RT PCR POSITIVE (*)    All other components within normal limits  COMPREHENSIVE  METABOLIC PANEL - Abnormal; Notable for the following components:   Sodium 134 (*)    Chloride 94 (*)    Glucose, Bld 138 (*)    AST 300 (*)    ALT 318 (*)    Anion gap 18 (*)    All other components within normal limits  ETHANOL - Abnormal; Notable for the following components:   Alcohol, Ethyl (B) 197 (*)    All other components within normal limits  CBC WITH DIFFERENTIAL/PLATELET - Abnormal; Notable for the following components:   WBC 11.8 (*)    MCHC 36.1 (*)    Neutro Abs 9.3 (*)    All other components within normal limits  SALICYLATE LEVEL - Abnormal; Notable for the following components:   Salicylate Lvl <7.0 (*)    All other components within normal limits  ACETAMINOPHEN LEVEL - Abnormal; Notable for the following components:   Acetaminophen (Tylenol), Serum <10 (*)    All other components within normal limits  RAPID URINE DRUG SCREEN, HOSP PERFORMED - Abnormal; Notable for the following components:   Tetrahydrocannabinol POSITIVE (*)    All other components within normal limits  RAPID URINE DRUG SCREEN, HOSP PERFORMED    No orders to display    Medications  risperiDONE (RISPERDAL M-TABS) disintegrating tablet 2 mg (has no administration in time range)    And  LORazepam (ATIVAN) tablet 1 mg (has no administration in time range)    And  ziprasidone (GEODON) injection 20 mg (has no administration in time range)  diphenhydrAMINE (BENADRYL) capsule 25 mg (25 mg Oral Given 01/31/21 2335)  thiamine tablet 100 mg (100 mg Oral Given 01/31/21 2334)    Or  thiamine (B-1) injection 100 mg ( Intravenous See Alternative 01/31/21 2334)  LORazepam (ATIVAN) injection 0-4 mg (0 mg Intravenous Not Given 01/31/21 2321)    Or  LORazepam (ATIVAN) tablet 0-4 mg ( Oral See Alternative 01/31/21 2321)  LORazepam (ATIVAN) injection 0-4 mg (has no administration in time range)    Or  LORazepam (ATIVAN) tablet 0-4 mg (has no administration in time range)  thiamine tablet 100 mg (has no  administration in time range)    Or  thiamine (B-1) injection 100 mg (has no administration in time range)  hydrOXYzine (ATARAX) tablet 25 mg (25 mg Oral Given 01/31/21 1958)     Procedures  /  Critical Care Procedures  ED Course and Medical Decision Making  Initial Impression and Ddx 31 year old male presenting to emergency department with positive COVID-19 diagnosis.  Patient was noted to be tachycardic.  Suspect this could be in relation to his COVID-19 status however he does have significant alcohol  use history.  So alcohol withdrawal could be complaining a component.  We will place the patient on CIWA protocol.  The patient had psychiatry screening labs which were largely unremarkable outside of a mild leukocytosis.  The patient is currently under IVC that was taken out at the behavioral unit.  I completed the patient's first look evaluation.  Feels that the patient is medically cleared at this time.  Will place consult to psychiatry to see the patient. Patient Reassessment and Ultimate Disposition/Management Patient will remain under IVC here in the emergency department until he is cleared by psychiatry.  Suspect that the patient will benefit from inpatient stay.  Patient management required discussion with the following services or consulting groups:  Psychiatry/TTS  Complexity of Problems Addressed Chronic illness with severe exacerbation     Final Clinical Impressions(s) / ED Diagnoses     ICD-10-CM   1. Anxiety  F41.9     2. Alcohol abuse with alcohol-induced mood disorder (HCC)  F10.14     3. Alcoholic liver disease (HCC)  X10.6     4. Acute alcoholic intoxication with complication (HCC)  F10.929     5. Chronic alcoholism (HCC)  F10.20     6. Suicidal ideation  R45.851     7. COVID-19 virus infection  U07.1       ED Discharge Orders     None        Discharge Instructions Discussed with and Provided to Patient:   Discharge Instructions   None        Camila Li, MD 01/31/21 Phineas Douglas    Cathren Laine, MD 02/01/21 1540

## 2021-02-01 LAB — PROLACTIN: Prolactin: 12 ng/mL (ref 4.0–15.2)

## 2021-02-01 MED ORDER — GABAPENTIN 300 MG PO CAPS
300.0000 mg | ORAL_CAPSULE | Freq: Three times a day (TID) | ORAL | Status: DC
  Administered 2021-02-01 – 2021-02-03 (×6): 300 mg via ORAL
  Filled 2021-02-01 (×6): qty 1

## 2021-02-01 MED ORDER — SERTRALINE HCL 50 MG PO TABS
50.0000 mg | ORAL_TABLET | Freq: Every day | ORAL | Status: DC
  Administered 2021-02-01 – 2021-02-03 (×3): 50 mg via ORAL
  Filled 2021-02-01 (×3): qty 1

## 2021-02-01 MED ORDER — GABAPENTIN 300 MG PO CAPS
300.0000 mg | ORAL_CAPSULE | Freq: Three times a day (TID) | ORAL | Status: DC
Start: 2021-02-01 — End: 2021-02-01

## 2021-02-01 NOTE — ED Notes (Addendum)
Patient's mother called, patient gave okay to discuss his care.

## 2021-02-01 NOTE — ED Notes (Signed)
Patient on TTS 

## 2021-02-01 NOTE — ED Notes (Signed)
Pt increasingly anxious and restless. Pt came to nurses station expressing concern about not being able to sleep.

## 2021-02-01 NOTE — ED Provider Notes (Signed)
Emergency Medicine Observation Re-evaluation Note  Thomas [name] is a 31 y.o. male, seen on rounds today.  Pt initially presented to the ED for complaints of No chief complaint on file. Currently, the patient is having TTS evaluation.  Physical Exam  BP (!) 152/98    Pulse (!) 112    Temp 98.6 F (37 C) (Oral)    Resp 18    SpO2 94%  Physical Exam General: No acute distress Cardiac: Well perfused Lungs: Nonlabored Psych: Cooperative  ED Course / MDM  EKG:   I have reviewed the labs performed to date as well as medications administered while in observation.  Recent changes in the last 24 hours include TTS evaluation.  Plan  Current plan is for awaiting TTS recommendations. Thomas [name] is under involuntary commitment.      Terrilee Files, MD 02/01/21 1324

## 2021-02-01 NOTE — ED Notes (Addendum)
Patient has asked multiple times about the Ativan he received. Asking can he have that when he leaves, why the dosages aren't higher, why he isn't feeling the affects of the medications. Explained we were just trying to help with the affects of possible alcohol withdrawal and that Ativan has a high instance of misuse and addiction.

## 2021-02-01 NOTE — ED Notes (Signed)
Patient on phone  

## 2021-02-01 NOTE — ED Notes (Signed)
Pt still reporting inability to sleep, When RN walked into room pt laying down resting but not fully asleep

## 2021-02-01 NOTE — ED Notes (Signed)
Pt expressing concern about when he will be able to be evaluated by out [facility] staff, Pt educated that we do not have an exact time but he will be updated when there is an update

## 2021-02-01 NOTE — BH Assessment (Signed)
This Probation officer met with patient this date by tele-psych to evaluate current mental health status. Patient denies any S/I or H/I although is concerned about ongoing AVH. Patient is vague in reference to content and this writer is uncertain based on presenting symptoms if patient is actually experiencing  hallucinations or suffering from alcohol withdrawals. Patient when asked stated, "it may be a little bit of both." Patient is observed to be having tremors, rocking in the bed, perspiring and picking at his skin. Patient is difficult to redirect and is focused on "just being discharged" this date. This Probation officer spoke at length in reference to patient's ongoing alcohol issues, chronic anxiety and psychosocial stressors associated with his current living situation. Patient keeps repeating "he needs to get out of here" reporting excessive anxiety "being in this room." This writer spoke with patient about the importance of a continued inpatient admission to assist with stabilization due to his increased alcohol use and frequency of hospital visits in the last 6 months associated with ongoing anxiety. Patient did not appear to be processing the content of this writer's questions at times as we discussed the importance of stabilization. Patient was informed that we would assist in providing information and assistance with him possibly becoming involved in a long term recovery program. Again patient did not seem to process the content of what this writer was attempting to explain. Jerelene Redden NP recommends patient continue with a inpatient admission at this time as bed placement is investigated.

## 2021-02-02 NOTE — ED Provider Notes (Signed)
Emergency Medicine Observation Re-evaluation Note  Thomas [name] is a 31 y.o. male, seen on rounds today.  Pt initially presented to the ED for complaints of No chief complaint on file. Currently, the patient is sleeping.  Physical Exam  BP 110/83 (BP Location: Right Arm)    Pulse (!) 133    Temp 98.2 F (36.8 C)    Resp 19    SpO2 99%  Physical Exam General: No acute distress Cardiac: Well perfused Lungs: Nonlabored Psych: Redirectable  ED Course / MDM  EKG:   I have reviewed the labs performed to date as well as medications administered while in observation.  Recent changes in the last 24 hours include psychiatric reassessment.  Plan  Current plan is for inpatient bed search. Cordell A Mcmanamon is under involuntary commitment.      Terrilee Files, MD 02/02/21 1113

## 2021-02-02 NOTE — ED Notes (Signed)
Pt asked RN when he would be going home. RN educated Pt that he is under an involuntary commitment and they are looking into inpatient treatment. Pt requesting update whenever we have more information

## 2021-02-02 NOTE — ED Notes (Signed)
Pt has been given AM meds and breakfast. Pt made one phone call. Pt went to bathroom, brushed teeth. Pt's mother called unit, RN received verbal confirmation that pt okay with releasing info to mother. Pt resting in stretcher at this time, awaiting TTS re-eval. No distress noted, pt denies further needs.

## 2021-02-03 ENCOUNTER — Other Ambulatory Visit (HOSPITAL_COMMUNITY): Payer: Self-pay

## 2021-02-03 ENCOUNTER — Encounter (HOSPITAL_COMMUNITY): Payer: Self-pay | Admitting: Registered Nurse

## 2021-02-03 DIAGNOSIS — F1020 Alcohol dependence, uncomplicated: Secondary | ICD-10-CM | POA: Diagnosis present

## 2021-02-03 DIAGNOSIS — F1994 Other psychoactive substance use, unspecified with psychoactive substance-induced mood disorder: Secondary | ICD-10-CM

## 2021-02-03 DIAGNOSIS — F1014 Alcohol abuse with alcohol-induced mood disorder: Secondary | ICD-10-CM

## 2021-02-03 DIAGNOSIS — F332 Major depressive disorder, recurrent severe without psychotic features: Secondary | ICD-10-CM

## 2021-02-03 DIAGNOSIS — F411 Generalized anxiety disorder: Secondary | ICD-10-CM

## 2021-02-03 MED ORDER — GABAPENTIN 300 MG PO CAPS
300.0000 mg | ORAL_CAPSULE | Freq: Three times a day (TID) | ORAL | 0 refills | Status: DC
Start: 2021-02-03 — End: 2021-04-07
  Filled 2021-02-03: qty 90, 30d supply, fill #0

## 2021-02-03 MED ORDER — SERTRALINE HCL 50 MG PO TABS
50.0000 mg | ORAL_TABLET | Freq: Every day | ORAL | 0 refills | Status: DC
Start: 2021-02-04 — End: 2021-04-07
  Filled 2021-02-03: qty 30, 30d supply, fill #0

## 2021-02-03 MED ORDER — CENTRUM PO CHEW
1.0000 | CHEWABLE_TABLET | Freq: Every day | ORAL | 0 refills | Status: AC
  Filled 2021-02-03: qty 30, 30d supply, fill #0

## 2021-02-03 NOTE — BH Assessment (Signed)
[facility] Assessment Progress Note   Per Shuvon Rankin, NP, this pt does not require psychiatric hospitalization at this time.  Pt is psychiatrically cleared.  Discharge instructions advise pt to continue treatment with [facility], and include pt's next appointment with Toy Cookey, NP on Wednesday, 03/19/2021 at 16:30, as well as walk-in hours.  EDP Tanda Rockers, DO and pt's nurse, Oneta Rack, have been notified.  Doylene Canning, MA Triage Specialist [phone]

## 2021-02-03 NOTE — Discharge Instructions (Addendum)
It was a pleasure caring for you today in the emergency department.  Please return to the emergency department for any worsening or worrisome symptoms.   For your behavioral health needs you are advised to continue treatment with [facility].  Your next appointment with Toy Cookey, NP is scheduled for Wednesday, March 19, 2021 at 4:30 pm:       [facility]      [address]      [place], [place] [address]      [phone]      If you feel that you need to be seen sooner, you can present during walk-in hours.  Walk-in hours are Monday - Thursday from 8:00 am - 11:00 am for psychiatry, and Friday from 1:00 pm - 4:00 pm for therapy.  Walk-in patients are seen on a first come, first served basis, so try to arrive as early as possible for the best chance of being seen the same day.

## 2021-02-03 NOTE — ED Notes (Signed)
Breakfast orders placed 

## 2021-02-03 NOTE — Consult Note (Signed)
Telepsych Consultation   Reason for Consult:  Suicidal ideation Referring Physician:  Lajean Saver, MD Location of Patient: [facility] ED Location of Provider: Other: [facility]  Patient Identification: Thomas [name] MRN:  [id number] Principal Diagnosis: Severe episode of recurrent major depressive disorder, without psychotic features ([place]) Diagnosis:  Principal Problem:   Severe episode of recurrent major depressive disorder, without psychotic features ([place]) Active Problems:   Generalized anxiety disorder   Substance induced mood disorder ([place])   Alcohol use disorder, moderate, dependence ([place])   Total Time spent with patient: 30 minutes  Subjective:   Thomas [name] is a 31 y.o. male patient admitted to [facility] ED after being sent from [facility] where patient presented with complaints of depression, suicidal ideation without specific plan, and alcohol use disorder.  HPI:  Thomas [name], 31 y.o., male patient seen via tele health by this provider, consulted with Dr. Hampton Abbot; and chart reviewed on 02/03/21.  On evaluation Thomas [name] reports the main reason he came in to be seen was because of his drinking.  Reports he was having some depression and was encouraged to come in by his mother and his outpatient psychiatric provider for evaluation.  Patient reports he is feeling better today.  States he has been in the hospital for couple of days and has been taking his medication.  Reports he plans on following up with Burt Ek at [facility] for medication management.  At this time patient denies suicidal ideation stating he was intoxicated and making impulsive statements.  Reports he does not want to die.  Patient reporting he has been drinking 15-20 beers a day "well not every day but several days a week."  Patient reports that he lives with his mother, grandmother, sister and her children.  Reports family is supportive.  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  Patient also denies  homicidal ideation, psychosis, and paranoia.  Since admission to the ED patient's behavior and interactions have been appropriate; he has been compliant with medication.  Reports he has been eating/sleeping without any difficulty and tolerating medication without any adverse reactions. During evaluation Markeise A Suver is sitting up in bed in no acute distress.  He is alert/oriented x 4; calm/cooperative; and mood congruent with affect.  He is speaking in a clear tone at moderate volume, and normal pace; with good eye contact.  His thought process is coherent and relevant; There is no indication that he is currently responding to internal/external stimuli or experiencing delusional thought content; and he has denied suicidal/self-harm/homicidal ideation, psychosis, and paranoia.   Patient has remained calm throughout assessment and has answered questions appropriately.    Past Psychiatric History: Major depressive disorder, general anxiety disorder, and alcohol use disorder  Risk to Self: Denies Risk to Others: Denies Prior Inpatient Therapy: Yes Prior Outpatient Therapy: Yes  Past Medical History:  Past Medical History:  Diagnosis Date   Anxiety     Past Surgical History:  Procedure Laterality Date   APPENDECTOMY     Family History: History reviewed. No pertinent family history. Family Psychiatric  History: None reported Social History:  Social History   Substance and Sexual Activity  Alcohol Use Yes   Comment: reported 20+ beers/day     Social History   Substance and Sexual Activity  Drug Use No    Social History   Socioeconomic History   Marital status: Single    Spouse name: Not on file   Number of children: Not  on file   Years of education: Not on file   Highest education level: Not on file  Occupational History   Not on file  Tobacco Use   Smoking status: Never   Smokeless tobacco: Never  Vaping Use   Vaping Use: Never used  Substance and Sexual Activity    Alcohol use: Yes    Comment: reported 20+ beers/day   Drug use: No   Sexual activity: Yes  Other Topics Concern   Not on file  Social History Narrative   Not on file   Social Determinants of Health   Financial Resource Strain: Not on file  Food Insecurity: Not on file  Transportation Needs: Not on file  Physical Activity: Not on file  Stress: Not on file  Social Connections: Not on file   Additional Social History:    Allergies:  No Known Allergies  Labs: No results found for this or any previous visit (from the past 48 hour(s)).  Medications:  Current Facility-Administered Medications  Medication Dose Route Frequency Provider Last Rate Last Admin   diphenhydrAMINE (BENADRYL) capsule 25 mg  25 mg Oral PRN Zachery Dakins, MD   25 mg at 02/03/21 0047   gabapentin (NEURONTIN) capsule 300 mg  300 mg Oral TID Merlyn Lot E, NP   300 mg at 02/03/21 [id number]   LORazepam (ATIVAN) injection 0-4 mg  0-4 mg Intravenous Q12H Zachery Dakins, MD       Or   LORazepam (ATIVAN) tablet 0-4 mg  0-4 mg Oral Q12H Zachery Dakins, MD       risperiDONE (RISPERDAL M-TABS) disintegrating tablet 2 mg  2 mg Oral Q8H PRN Zachery Dakins, MD   2 mg at 02/02/21 0820   And   ziprasidone (GEODON) injection 20 mg  20 mg Intramuscular PRN Zachery Dakins, MD       sertraline (ZOLOFT) tablet 50 mg  50 mg Oral Daily Merlyn Lot E, NP   50 mg at 02/03/21 [id number]   thiamine tablet 100 mg  100 mg Oral Daily Zachery Dakins, MD   100 mg at 02/03/21 [id number]   Or   thiamine (B-1) injection 100 mg  100 mg Intravenous Daily Zachery Dakins, MD       Current Outpatient Medications  Medication Sig Dispense Refill   BENADRYL ALLERGY 25 MG capsule Take 25 mg by mouth as needed for sleep.      Musculoskeletal: Strength & Muscle Tone: within normal limits Gait & Station: normal Patient leans: N/A          Psychiatric Specialty Exam:  Presentation  General Appearance: Disheveled  Eye  Contact:Fair  Speech:Clear and Coherent; Normal Rate  Speech Volume:Normal  Handedness:Right   Mood and Affect  Mood:Anxious; Depressed; Hopeless; Labile  Affect:Depressed; Labile; Tearful   Thought Process  Thought Processes:Coherent; Linear; Goal Directed  Descriptions of Associations:Intact  Orientation:Full (Time, Place and Person)  Thought Content:Logical  History of Schizophrenia/Schizoaffective disorder:No  Duration of Psychotic Symptoms:No data recorded Hallucinations:No data recorded Ideas of Reference:None  Suicidal Thoughts:No data recorded Homicidal Thoughts:No data recorded  Sensorium  Memory:Immediate Good; Recent Fair  Judgment:Impaired  Insight:Shallow   Executive Functions  Concentration:Fair  Attention Span:Fair  [place]  Language:Good   Psychomotor Activity  Psychomotor Activity:No data recorded  Assets  Assets:Communication Skills; Housing; Intimacy; Leisure Time; Resilience; Social Support   Sleep  Sleep:No data recorded   Physical Exam: Physical Exam Vitals and nursing note reviewed. Exam conducted  with a chaperone present.  Constitutional:      General: He is not in acute distress.    Appearance: Normal appearance. He is not ill-appearing.  Cardiovascular:     Rate and Rhythm: Normal rate.  Pulmonary:     Effort: Pulmonary effort is normal.  Neurological:     Mental Status: He is alert and oriented to person, place, and time.  Psychiatric:        Attention and Perception: Attention and perception normal. He does not perceive auditory or visual hallucinations.        Mood and Affect: Mood and affect normal.        Speech: Speech normal.        Behavior: Behavior normal. Behavior is cooperative.        Thought Content: Thought content normal. Thought content is not paranoid or delusional. Thought content does not include homicidal or suicidal ideation.        Cognition and Memory:  Cognition and memory normal.        Judgment: Judgment normal.   Review of Systems  Constitutional: Negative.   HENT: Negative.    Eyes: Negative.   Respiratory:  Negative for shortness of breath.        COVID+  Cardiovascular: Negative.   Gastrointestinal: Negative.   Genitourinary: Negative.   Musculoskeletal: Negative.   Skin: Negative.   Neurological: Negative.   Endo/Heme/Allergies: Negative.   Psychiatric/Behavioral:  Positive for depression (Stable). Hallucinations: Denies. Substance abuse: Alcohol. Suicidal ideas: Denies.The patient is nervous/anxious (Stable). Insomnia: Denies.  Blood pressure 124/89, pulse (!) 109, temperature 98.3 F (36.8 C), temperature source Oral, resp. rate 19, SpO2 99 %. There is no height or weight on file to calculate BMI.  Treatment Plan Summary: Plan psychiatrically clear to follow-up with [facility].  Disposition: No evidence of imminent risk to self or others at present.   Patient does not meet criteria for psychiatric inpatient admission. Supportive therapy provided about ongoing stressors. Refer to IOP. Discussed crisis plan, support from social network, calling 911, coming to the Emergency Department, and calling Suicide Hotline.  This service was provided via telemedicine using a 2-way, interactive audio and video technology.  Names of all persons participating in this telemedicine service and their role in this encounter. Name: Earleen Newport Role: NP  Name: Onterrio Ruley Role: Patient  Name:  Role:   Name:  Role:     Secure message sent to patients nurse Blima Singer Pflueger, RN informing:  Psychiatric consult completed and patient psychiatrically cleared.  I've entered his follow up appointment at [facility] has been added to.  Rx for Gabapentin and Zoloft sent to [facility] outpatient pharmacy.  Inform MD only default listed.    Cassara Nida, NP 02/03/2021 12:11 PM

## 2021-02-03 NOTE — ED Notes (Signed)
Patient given discharge instructions, all questions answered. Patient in possession of all belongings, directed to the discharge area  

## 2021-02-18 ENCOUNTER — Telehealth (HOSPITAL_COMMUNITY): Payer: Self-pay | Admitting: Emergency Medicine

## 2021-02-18 NOTE — BH Assessment (Signed)
Care Management -  [facility]UC Follow Up Discharges   Per chart review, patient was transfer to [facility] emergency department by Dr. Stevie Kern due to testing positive for COVID.Marland Kitchen

## 2021-03-19 ENCOUNTER — Encounter (HOSPITAL_COMMUNITY): Payer: Self-pay

## 2021-03-19 ENCOUNTER — Telehealth (HOSPITAL_COMMUNITY): Payer: No Payment, Other | Admitting: Physician Assistant

## 2021-04-02 ENCOUNTER — Other Ambulatory Visit: Payer: Self-pay

## 2021-04-02 ENCOUNTER — Emergency Department (HOSPITAL_COMMUNITY)
Admission: EM | Admit: 2021-04-02 | Discharge: 2021-04-03 | Disposition: A | Discharge status: Other Acute Inpatient Hospital | Payer: Self-pay | Attending: Emergency Medicine | Admitting: Emergency Medicine

## 2021-04-02 ENCOUNTER — Encounter (HOSPITAL_COMMUNITY): Payer: Self-pay

## 2021-04-02 DIAGNOSIS — F411 Generalized anxiety disorder: Secondary | ICD-10-CM | POA: Diagnosis present

## 2021-04-02 DIAGNOSIS — F332 Major depressive disorder, recurrent severe without psychotic features: Secondary | ICD-10-CM | POA: Diagnosis present

## 2021-04-02 DIAGNOSIS — Z20822 Contact with and (suspected) exposure to covid-19: Secondary | ICD-10-CM | POA: Insufficient documentation

## 2021-04-02 DIAGNOSIS — F1994 Other psychoactive substance use, unspecified with psychoactive substance-induced mood disorder: Secondary | ICD-10-CM | POA: Diagnosis present

## 2021-04-02 DIAGNOSIS — F39 Unspecified mood [affective] disorder: Secondary | ICD-10-CM

## 2021-04-02 DIAGNOSIS — F419 Anxiety disorder, unspecified: Secondary | ICD-10-CM

## 2021-04-02 DIAGNOSIS — G4700 Insomnia, unspecified: Secondary | ICD-10-CM | POA: Insufficient documentation

## 2021-04-02 DIAGNOSIS — F409 Phobic anxiety disorder, unspecified: Secondary | ICD-10-CM | POA: Diagnosis present

## 2021-04-02 DIAGNOSIS — R000 Tachycardia, unspecified: Secondary | ICD-10-CM | POA: Insufficient documentation

## 2021-04-02 DIAGNOSIS — F5105 Insomnia due to other mental disorder: Secondary | ICD-10-CM | POA: Diagnosis present

## 2021-04-02 LAB — CBC WITH DIFFERENTIAL/PLATELET
Abs Immature Granulocytes: 0.03 10*3/uL (ref 0.00–0.07)
Basophils Absolute: 0.1 10*3/uL (ref 0.0–0.1)
Basophils Relative: 1 %
Eosinophils Absolute: 0.1 10*3/uL (ref 0.0–0.5)
Eosinophils Relative: 1 %
HCT: 49 % (ref 39.0–52.0)
Hemoglobin: 17.5 g/dL — ABNORMAL HIGH (ref 13.0–17.0)
Immature Granulocytes: 0 %
Lymphocytes Relative: 29 %
Lymphs Abs: 2 10*3/uL (ref 0.7–4.0)
MCH: 32.8 pg (ref 26.0–34.0)
MCHC: 35.7 g/dL (ref 30.0–36.0)
MCV: 91.9 fL (ref 80.0–100.0)
Monocytes Absolute: 0.6 10*3/uL (ref 0.1–1.0)
Monocytes Relative: 9 %
Neutro Abs: 4 10*3/uL (ref 1.7–7.7)
Neutrophils Relative %: 60 %
Platelets: 338 10*3/uL (ref 150–400)
RBC: 5.33 MIL/uL (ref 4.22–5.81)
RDW: 13.3 % (ref 11.5–15.5)
WBC: 6.9 10*3/uL (ref 4.0–10.5)
nRBC: 0 % (ref 0.0–0.2)

## 2021-04-02 LAB — COMPREHENSIVE METABOLIC PANEL
ALT: 71 U/L — ABNORMAL HIGH (ref 0–44)
AST: 47 U/L — ABNORMAL HIGH (ref 15–41)
Albumin: 4.9 g/dL (ref 3.5–5.0)
Alkaline Phosphatase: 89 U/L (ref 38–126)
Anion gap: 15 (ref 5–15)
BUN: 6 mg/dL (ref 6–20)
CO2: 25 mmol/L (ref 22–32)
Calcium: 9 mg/dL (ref 8.9–10.3)
Chloride: 97 mmol/L — ABNORMAL LOW (ref 98–111)
Creatinine, Ser: 0.65 mg/dL (ref 0.61–1.24)
GFR, Estimated: >60 mL/min (ref >60)
Glucose, Bld: 114 mg/dL — ABNORMAL HIGH (ref 70–99)
Potassium: 3.5 mmol/L (ref 3.5–5.1)
Sodium: 137 mmol/L (ref 135–145)
Total Bilirubin: 0.3 mg/dL (ref 0.3–1.2)
Total Protein: 9 g/dL — ABNORMAL HIGH (ref 6.5–8.1)

## 2021-04-02 LAB — ETHANOL: Alcohol, Ethyl (B): 374 mg/dL (ref <10)

## 2021-04-02 LAB — ACETAMINOPHEN LEVEL: Acetaminophen (Tylenol), Serum: <10 ug/mL — ABNORMAL LOW (ref 10–30)

## 2021-04-02 NOTE — ED Provider Triage Note (Signed)
Emergency Medicine Provider Triage Evaluation Note ? ?Thomas [name] , a 31 y.o. male  was evaluated in triage.  Pt complains of anxiety and depression.  The patient denies homicidal ideation or suicidal ideation. The patient denies auditory or visual hallucinations. The patient endorses alcohol usage but denies drug usage ? ?Review of Systems  ?Positive: Anxiety, depression ?Negative: SI, HI, hallucinations ? ?Physical Exam  ?BP (!) 137/91 (BP Location: Left Arm)   Pulse (!) 120   Temp 98 ?F (36.7 ?C) (Oral)   Resp 18   Ht 6' (1.829 m)   Wt 68 kg   SpO2 95%   BMI 20.34 kg/m?  ?Gen:   Sleeping but easily arousable ?Resp:  Normal effort  ?MSK:   Moves extremities without difficulty  ?Other:  Patient was incontinent in triage ? ?Medical Decision Making  ?Medically screening exam initiated at 10:31 PM.  Appropriate orders placed.  [name] was informed that the remainder of the evaluation will be completed by another provider, this initial triage assessment does not replace that evaluation, and the importance of remaining in the ED until their evaluation is complete. ? ? ?  ?Darrick Grinder, PA-C ?04/02/21 2236 ? ?

## 2021-04-02 NOTE — ED Triage Notes (Addendum)
Patient BIB EMS, per report pt has been sending messages to someone stating he is going to kill himself by taking a gun to his head. Pt denies SI/HI states he Is just depressed. ETOH, pt states no SI he just has a lot of anxiety.  ?

## 2021-04-02 NOTE — ED Notes (Signed)
Pt had bowel movement in his clothes shortly after moving him into triage room 4. Pt given paper scrubs and towels/soap and was showed to the restroom to clean himself up. Pt ambulatory to restroom without assistance. Will obtain EKG once pt returns to room after cleaning himself up. ?

## 2021-04-02 NOTE — ED Notes (Signed)
Urine sample at triage desk. ?

## 2021-04-02 NOTE — ED Notes (Signed)
Pt requesting anxiety medication. Triage PA made aware. ?

## 2021-04-03 ENCOUNTER — Encounter (HOSPITAL_COMMUNITY): Payer: Self-pay | Admitting: Psychiatry

## 2021-04-03 ENCOUNTER — Inpatient Hospital Stay (HOSPITAL_COMMUNITY)
Admission: AD | Admit: 2021-04-03 | Discharge: 2021-04-08 | DRG: 885 | Disposition: A | Discharge status: Home / Self Care | Payer: Federal, State, Local not specified - Other | Source: Intra-hospital | Attending: Psychiatry | Admitting: Psychiatry

## 2021-04-03 DIAGNOSIS — F333 Major depressive disorder, recurrent, severe with psychotic symptoms: Principal | ICD-10-CM | POA: Diagnosis present

## 2021-04-03 DIAGNOSIS — Z9151 Personal history of suicidal behavior: Secondary | ICD-10-CM

## 2021-04-03 DIAGNOSIS — G4700 Insomnia, unspecified: Secondary | ICD-10-CM | POA: Diagnosis present

## 2021-04-03 DIAGNOSIS — F410 Panic disorder [episodic paroxysmal anxiety] without agoraphobia: Secondary | ICD-10-CM | POA: Diagnosis present

## 2021-04-03 DIAGNOSIS — F10239 Alcohol dependence with withdrawal, unspecified: Secondary | ICD-10-CM | POA: Diagnosis present

## 2021-04-03 DIAGNOSIS — F1020 Alcohol dependence, uncomplicated: Secondary | ICD-10-CM | POA: Diagnosis present

## 2021-04-03 DIAGNOSIS — E876 Hypokalemia: Secondary | ICD-10-CM | POA: Diagnosis present

## 2021-04-03 DIAGNOSIS — F411 Generalized anxiety disorder: Secondary | ICD-10-CM | POA: Diagnosis present

## 2021-04-03 DIAGNOSIS — F329 Major depressive disorder, single episode, unspecified: Secondary | ICD-10-CM | POA: Diagnosis present

## 2021-04-03 DIAGNOSIS — F332 Major depressive disorder, recurrent severe without psychotic features: Secondary | ICD-10-CM | POA: Diagnosis not present

## 2021-04-03 LAB — RAPID URINE DRUG SCREEN, HOSP PERFORMED
Amphetamines: NOT DETECTED
Barbiturates: NOT DETECTED
Benzodiazepines: NOT DETECTED
Cocaine: NOT DETECTED
Opiates: NOT DETECTED
Tetrahydrocannabinol: POSITIVE — AB

## 2021-04-03 LAB — RESP PANEL BY RT-PCR (FLU A&B, COVID) ARPGX2
Influenza A by PCR: NEGATIVE
Influenza B by PCR: NEGATIVE
SARS Coronavirus 2 by RT PCR: NEGATIVE

## 2021-04-03 MED ORDER — THIAMINE HCL 100 MG/ML IJ SOLN: 100.0000 mg | Freq: Once | INTRAMUSCULAR | Status: DC

## 2021-04-03 MED ORDER — ZIPRASIDONE MESYLATE 20 MG IM SOLR: 20.0000 mg | Freq: Three times a day (TID) | INTRAMUSCULAR | Status: DC | PRN

## 2021-04-03 MED ORDER — GABAPENTIN 300 MG PO CAPS
300.0000 mg | ORAL_CAPSULE | Freq: Three times a day (TID) | ORAL | Status: DC
  Administered 2021-04-03 (×2): 300 mg via ORAL
  Filled 2021-04-03 (×2): qty 1

## 2021-04-03 MED ORDER — LORAZEPAM 1 MG PO TABS: 1.0000 mg | ORAL_TABLET | Freq: Four times a day (QID) | ORAL | Status: DC | PRN

## 2021-04-03 MED ORDER — LORAZEPAM 1 MG PO TABS
1.0000 mg | ORAL_TABLET | Freq: Three times a day (TID) | ORAL | Status: AC
  Administered 2021-04-05 (×3): 1 mg via ORAL
  Filled 2021-04-03 (×3): qty 1

## 2021-04-03 MED ORDER — LORAZEPAM 1 MG PO TABS: 0.0000 mg | ORAL_TABLET | Freq: Two times a day (BID) | ORAL | Status: DC

## 2021-04-03 MED ORDER — LORAZEPAM 2 MG/ML IJ SOLN: 2.0000 mg | Freq: Two times a day (BID) | INTRAMUSCULAR | Status: DC | PRN

## 2021-04-03 MED ORDER — HYDROXYZINE HCL 25 MG PO TABS
25.0000 mg | ORAL_TABLET | Freq: Three times a day (TID) | ORAL | Status: DC | PRN
  Administered 2021-04-04 – 2021-04-06 (×3): 25 mg via ORAL
  Filled 2021-04-03 (×2): qty 1
  Filled 2021-04-03: qty 10
  Filled 2021-04-03: qty 1

## 2021-04-03 MED ORDER — MAGNESIUM HYDROXIDE 400 MG/5ML PO SUSP: 30.0000 mL | Freq: Every day | ORAL | Status: DC | PRN

## 2021-04-03 MED ORDER — ALUM & MAG HYDROXIDE-SIMETH 200-200-20 MG/5ML PO SUSP
30.0000 mL | Freq: Four times a day (QID) | ORAL | Status: DC | PRN
  Administered 2021-04-03: 30 mL via ORAL
  Filled 2021-04-03: qty 30

## 2021-04-03 MED ORDER — THIAMINE HCL 100 MG/ML IJ SOLN: 100.0000 mg | Freq: Every day | INTRAMUSCULAR | Status: DC

## 2021-04-03 MED ORDER — LORAZEPAM 1 MG PO TABS
0.0000 mg | ORAL_TABLET | Freq: Four times a day (QID) | ORAL | Status: DC
  Administered 2021-04-03 (×2): 1 mg via ORAL
  Administered 2021-04-03: 2 mg via ORAL
  Filled 2021-04-03 (×2): qty 1
  Filled 2021-04-03: qty 2

## 2021-04-03 MED ORDER — THIAMINE HCL 100 MG PO TABS
100.0000 mg | ORAL_TABLET | Freq: Every day | ORAL | Status: DC
  Administered 2021-04-03: 100 mg via ORAL
  Filled 2021-04-03: qty 1

## 2021-04-03 MED ORDER — CLONIDINE HCL 0.1 MG PO TABS
0.1000 mg | ORAL_TABLET | Freq: Two times a day (BID) | ORAL | Status: DC
  Administered 2021-04-03 – 2021-04-07 (×5): 0.1 mg via ORAL
  Filled 2021-04-03 (×11): qty 1

## 2021-04-03 MED ORDER — ADULT MULTIVITAMIN W/MINERALS CH
1.0000 | ORAL_TABLET | Freq: Every day | ORAL | Status: DC
  Administered 2021-04-03 – 2021-04-08 (×6): 1 via ORAL
  Filled 2021-04-03 (×8): qty 1

## 2021-04-03 MED ORDER — LOPERAMIDE HCL 2 MG PO CAPS: 2.0000 mg | ORAL_CAPSULE | ORAL | Status: AC | PRN

## 2021-04-03 MED ORDER — LORAZEPAM 2 MG/ML IJ SOLN: 0.0000 mg | Freq: Four times a day (QID) | INTRAMUSCULAR | Status: DC

## 2021-04-03 MED ORDER — LORAZEPAM 1 MG PO TABS
1.0000 mg | ORAL_TABLET | Freq: Every day | ORAL | Status: AC
  Administered 2021-04-07: 1 mg via ORAL
  Filled 2021-04-03: qty 1

## 2021-04-03 MED ORDER — TRAZODONE HCL 50 MG PO TABS
50.0000 mg | ORAL_TABLET | Freq: Every evening | ORAL | Status: DC | PRN
  Administered 2021-04-03 – 2021-04-07 (×5): 50 mg via ORAL
  Filled 2021-04-03 (×4): qty 1
  Filled 2021-04-03: qty 7
  Filled 2021-04-03: qty 1

## 2021-04-03 MED ORDER — RISPERIDONE 2 MG PO TBDP: 2.0000 mg | ORAL_TABLET | Freq: Three times a day (TID) | ORAL | Status: DC | PRN

## 2021-04-03 MED ORDER — ACETAMINOPHEN 325 MG PO TABS: 650.0000 mg | ORAL_TABLET | Freq: Four times a day (QID) | ORAL | Status: DC | PRN

## 2021-04-03 MED ORDER — ONDANSETRON 4 MG PO TBDP: 4.0000 mg | ORAL_TABLET | Freq: Four times a day (QID) | ORAL | Status: AC | PRN

## 2021-04-03 MED ORDER — THIAMINE HCL 100 MG PO TABS
100.0000 mg | ORAL_TABLET | Freq: Every day | ORAL | Status: DC
  Administered 2021-04-04 – 2021-04-08 (×5): 100 mg via ORAL
  Filled 2021-04-03 (×6): qty 1

## 2021-04-03 MED ORDER — GABAPENTIN 300 MG PO CAPS: 300.0000 mg | ORAL_CAPSULE | Freq: Three times a day (TID) | ORAL | Status: DC

## 2021-04-03 MED ORDER — IBUPROFEN 400 MG PO TABS: 400.0000 mg | ORAL_TABLET | Freq: Three times a day (TID) | ORAL | Status: DC | PRN

## 2021-04-03 MED ORDER — LORAZEPAM 2 MG/ML IJ SOLN: 0.0000 mg | Freq: Two times a day (BID) | INTRAMUSCULAR | Status: DC

## 2021-04-03 MED ORDER — GABAPENTIN 300 MG PO CAPS
300.0000 mg | ORAL_CAPSULE | Freq: Three times a day (TID) | ORAL | Status: DC
  Administered 2021-04-03 – 2021-04-08 (×14): 300 mg via ORAL
  Filled 2021-04-03 (×5): qty 1
  Filled 2021-04-03 (×2): qty 21
  Filled 2021-04-03 (×6): qty 1
  Filled 2021-04-03 (×2): qty 21
  Filled 2021-04-03 (×2): qty 1
  Filled 2021-04-03: qty 21
  Filled 2021-04-03 (×2): qty 1
  Filled 2021-04-03: qty 21
  Filled 2021-04-03: qty 1

## 2021-04-03 MED ORDER — LORAZEPAM 1 MG PO TABS
1.0000 mg | ORAL_TABLET | Freq: Four times a day (QID) | ORAL | Status: AC
  Administered 2021-04-03 – 2021-04-04 (×6): 1 mg via ORAL
  Filled 2021-04-03 (×6): qty 1

## 2021-04-03 MED ORDER — LORAZEPAM 1 MG PO TABS
1.0000 mg | ORAL_TABLET | Freq: Four times a day (QID) | ORAL | Status: AC | PRN
Start: 2021-04-03 — End: 2021-04-06

## 2021-04-03 MED ORDER — SERTRALINE HCL 50 MG PO TABS
50.0000 mg | ORAL_TABLET | Freq: Every day | ORAL | Status: DC
  Administered 2021-04-03: 50 mg via ORAL
  Filled 2021-04-03: qty 1

## 2021-04-03 MED ORDER — LORAZEPAM 1 MG PO TABS
1.0000 mg | ORAL_TABLET | Freq: Two times a day (BID) | ORAL | Status: AC
  Administered 2021-04-06 (×2): 1 mg via ORAL
  Filled 2021-04-03 (×2): qty 1

## 2021-04-03 MED ORDER — LORAZEPAM 1 MG PO TABS: 1.0000 mg | ORAL_TABLET | Freq: Three times a day (TID) | ORAL | Status: DC | PRN

## 2021-04-03 MED ORDER — ALUM & MAG HYDROXIDE-SIMETH 200-200-20 MG/5ML PO SUSP
30.0000 mL | ORAL | Status: DC | PRN
Start: 2021-04-03 — End: 2021-04-08

## 2021-04-03 NOTE — ED Notes (Signed)
Transportation arranged

## 2021-04-03 NOTE — Progress Notes (Signed)
Patient ID: Thomas [name], male   DOB: [birth year], 31 y.o.   MRN: [id number] ? ? ?Initial Treatment Plan ?04/03/2021 ?6:31 PM ?[name] ?[id number] ? ? ? ?PATIENT STRESSORS: ?Loss of relationship   ?Substance abuse   ? ? ?PATIENT STRENGTHS: ?Average or above average intelligence  ?Communication skills  ?Supportive family/friends  ? ? ?PATIENT IDENTIFIED PROBLEMS: ?Depression  ?Suicidal Ideation  ?Anxiety  ?Alcohol Use  ?  ?  ?  ?  ?  ?  ? ?DISCHARGE CRITERIA:  ?Ability to meet basic life and health needs ?Improved stabilization in mood, thinking, and/or behavior ?Verbal commitment to aftercare and medication compliance ?Withdrawal symptoms are absent or subacute and managed without 24-hour nursing intervention ? ?PRELIMINARY DISCHARGE PLAN: ?Outpatient therapy ?Return to previous living arrangement ? ?PATIENT/FAMILY INVOLVEMENT: ?This treatment plan has been presented to and reviewed with the patient, Thomas [name], and/or family member.  The patient and family have been given the opportunity to ask questions and make suggestions. ? ?Tania Ade, RN ?04/03/2021, 6:31 PM ?

## 2021-04-03 NOTE — ED Notes (Signed)
Attempted to call report. Receiving RN will call back. 
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
## 2021-04-03 NOTE — ED Notes (Signed)
Pt. Given apple juice.  
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
## 2021-04-03 NOTE — Progress Notes (Addendum)
Pt was accepted to [facility] today 04/03/2021 at 3:30pm PENDING Negative COVID-19 ? ?DX. MDD , Etoh Use D.O.  ? ?Pt meets inpatient criteria per Melvyn Neth, NP ? ?Attending Physician will be MD Phineas Inches ? ?Report can be called to: - Child and Adolescence unit: [phone] -Adult unit: [phone] ? ?Pt can arrive after 3:30pm ? ?Per [facility] [id number] INSTRUCT PATIENT THAT WE ARE UNDER NOROVIRUS PRECAUTIONS AND ALL PTS ARE ISOLATING AT THIS TIME, NO PROGRAMMING UNTIL Monday 04/07/2021. ? ?Care Team notified: Hillery Jacks, NP, [facility] Malva Limes, RN, I-Li Howell Rucks. RN, Dossie Arbour, RN, Edd Arbour, Alan Mulder, FNP.  ? ?Kelton Pillar, LCSWA ?04/03/2021 @ 12:36 PM ? ?

## 2021-04-03 NOTE — ED Provider Notes (Signed)
Emergency Medicine Observation Re-evaluation Note ? ?Thomas [name] is a 31 y.o. male, seen on rounds today.  Pt initially presented to the ED for complaints of Anxiety ?Currently, the patient is sleeping. ? ?Physical Exam  ?BP 120/83 (BP Location: Left Arm)   Pulse (!) 105   Temp 97.8 ?F (36.6 ?C) (Oral)   Resp 18   Ht 6' (1.829 m)   Wt 68 kg   SpO2 96%   BMI 20.34 kg/m?  ?Physical Exam ?General: No distress ?Respiratory: No respiratory distress ? ?ED Course / MDM  ?EKG:EKG Interpretation ? ?Date/Time:  Wednesday April 02 2021 23:11:13 EDT ?Ventricular Rate:  101 ?PR Interval:  112 ?QRS Duration: 81 ?QT Interval:  348 ?QTC Calculation: 452 ?R Axis:   78 ?Text Interpretation: Sinus tachycardia RSR' in V1 or V2, probably normal variant Confirmed by Tilden Fossa [phone]) on 04/02/2021 11:35:54 PM ? ?I have reviewed the labs performed to date as well as medications administered while in observation.  Recent changes in the last 24 hours include : No acute changes. ? ?Plan  ?Current plan is for me to enter the home meds.  Medication reconciliation completed. ?Patient slightly tachycardic.  UDS positive for THC.  Patient also has ethanol level of over 350. ? ?Awaiting evaluation by TTS. ?Medically cleared for psychiatry evaluation. ? ?Thomas [name] is under involuntary commitment. ?  ? ?  ?Derwood Kaplan, MD ?04/03/21 [phone] ? ?

## 2021-04-03 NOTE — ED Notes (Addendum)
Patient changed into burgundy scrubs and wanded by security. Patient belongings placed into bag containing pants, shirt, underwear, vest, and pair of shoes. Bag located above the 5-8 nurses station in cabinet.  ?

## 2021-04-04 ENCOUNTER — Encounter (HOSPITAL_COMMUNITY): Payer: Self-pay

## 2021-04-04 LAB — HEPATIC FUNCTION PANEL
ALT: 55 U/L — ABNORMAL HIGH (ref 0–44)
AST: 38 U/L (ref 15–41)
Albumin: 4.2 g/dL (ref 3.5–5.0)
Alkaline Phosphatase: 72 U/L (ref 38–126)
Bilirubin, Direct: 0.2 mg/dL (ref 0.0–0.2)
Indirect Bilirubin: 0.9 mg/dL (ref 0.3–0.9)
Total Bilirubin: 1.1 mg/dL (ref 0.3–1.2)
Total Protein: 7.6 g/dL (ref 6.5–8.1)

## 2021-04-04 LAB — CBC
HCT: 42.9 % (ref 39.0–52.0)
Hemoglobin: 15.2 g/dL (ref 13.0–17.0)
MCH: 33 pg (ref 26.0–34.0)
MCHC: 35.4 g/dL (ref 30.0–36.0)
MCV: 93.1 fL (ref 80.0–100.0)
Platelets: 338 10*3/uL (ref 150–400)
RBC: 4.61 MIL/uL (ref 4.22–5.81)
RDW: 13.2 % (ref 11.5–15.5)
WBC: 7.9 10*3/uL (ref 4.0–10.5)
nRBC: 0 % (ref 0.0–0.2)

## 2021-04-04 LAB — COMPREHENSIVE METABOLIC PANEL
ALT: 56 U/L — ABNORMAL HIGH (ref 0–44)
AST: 38 U/L (ref 15–41)
Albumin: 4.2 g/dL (ref 3.5–5.0)
Alkaline Phosphatase: 74 U/L (ref 38–126)
Anion gap: 11 (ref 5–15)
BUN: 13 mg/dL (ref 6–20)
CO2: 29 mmol/L (ref 22–32)
Calcium: 9.5 mg/dL (ref 8.9–10.3)
Chloride: 92 mmol/L — ABNORMAL LOW (ref 98–111)
Creatinine, Ser: 0.75 mg/dL (ref 0.61–1.24)
GFR, Estimated: >60 mL/min (ref >60)
Glucose, Bld: 109 mg/dL — ABNORMAL HIGH (ref 70–99)
Potassium: 3.2 mmol/L — ABNORMAL LOW (ref 3.5–5.1)
Sodium: 132 mmol/L — ABNORMAL LOW (ref 135–145)
Total Bilirubin: 1.2 mg/dL (ref 0.3–1.2)
Total Protein: 7.5 g/dL (ref 6.5–8.1)

## 2021-04-04 LAB — HEMOGLOBIN A1C
Hgb A1c MFr Bld: 4.9 % (ref 4.8–5.6)
Mean Plasma Glucose: 93.93 mg/dL

## 2021-04-04 LAB — TSH: TSH: 1.533 u[IU]/mL (ref 0.350–4.500)

## 2021-04-04 MED ORDER — NALTREXONE HCL 50 MG PO TABS
50.0000 mg | ORAL_TABLET | Freq: Every day | ORAL | Status: DC
Start: 2021-04-05 — End: 2021-04-08
  Administered 2021-04-05 – 2021-04-08 (×4): 50 mg via ORAL
  Filled 2021-04-04: qty 1
  Filled 2021-04-04 (×2): qty 7
  Filled 2021-04-04 (×3): qty 1

## 2021-04-04 MED ORDER — SERTRALINE HCL 50 MG PO TABS
50.0000 mg | ORAL_TABLET | Freq: Every day | ORAL | Status: DC
Start: 2021-04-04 — End: 2021-04-08
  Administered 2021-04-04 – 2021-04-08 (×5): 50 mg via ORAL
  Filled 2021-04-04 (×3): qty 1
  Filled 2021-04-04: qty 7
  Filled 2021-04-04: qty 1
  Filled 2021-04-04: qty 7
  Filled 2021-04-04 (×2): qty 1

## 2021-04-04 MED ORDER — POTASSIUM CHLORIDE CRYS ER 20 MEQ PO TBCR
40.0000 meq | EXTENDED_RELEASE_TABLET | Freq: Two times a day (BID) | ORAL | Status: AC
  Administered 2021-04-04 – 2021-04-05 (×2): 40 meq via ORAL
  Filled 2021-04-04 (×4): qty 2

## 2021-04-04 MED ORDER — ENSURE ENLIVE PO LIQD
237.0000 mL | Freq: Two times a day (BID) | ORAL | Status: DC
  Administered 2021-04-04 – 2021-04-07 (×6): 237 mL via ORAL
  Filled 2021-04-04 (×11): qty 237
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
## 2021-04-04 NOTE — BH IP Treatment Plan (Signed)
Interdisciplinary Treatment and Diagnostic Plan Update ? ?04/04/2021 ?Time of Session: 9:50am ?Thomas [name] ?MRN: [id number] ? ?Principal Diagnosis: MDD (major depressive disorder), recurrent, severe, with psychosis (HCC) ? ?Secondary Diagnoses: Principal Problem: ?  MDD (major depressive disorder), recurrent, severe, with psychosis (HCC) ?Active Problems: ?  MDD (major depressive disorder), single episode ? ? ?Current Medications:  ?Current Facility-Administered Medications  ?Medication Dose Route Frequency Provider Last Rate Last Admin  ? alum & mag hydroxide-simeth (MAALOX/MYLANTA) 200-200-20 MG/5ML suspension 30 mL  30 mL Oral Q4H PRN Ntuen, Jesusita Oka, FNP      ? cloNIDine (CATAPRES) tablet 0.1 mg  0.1 mg Oral BID Massengill, Harrold Donath, MD   0.1 mg at 04/04/21 2831  ? feeding supplement (ENSURE ENLIVE / ENSURE PLUS) liquid 237 mL  237 mL Oral BID BM Massengill, Nathan, MD      ? gabapentin (NEURONTIN) capsule 300 mg  300 mg Oral TID Phineas Inches, MD   300 mg at 04/04/21 5176  ? hydrOXYzine (ATARAX) tablet 25 mg  25 mg Oral TID PRN Cecilie Lowers, FNP   25 mg at 04/04/21 0453  ? ibuprofen (ADVIL) tablet 400 mg  400 mg Oral Q8H PRN Massengill, Harrold Donath, MD      ? loperamide (IMODIUM) capsule 2-4 mg  2-4 mg Oral PRN Massengill, Harrold Donath, MD      ? LORazepam (ATIVAN) injection 2 mg  2 mg Intramuscular BID PRN Massengill, Harrold Donath, MD      ? LORazepam (ATIVAN) tablet 1 mg  1 mg Oral QID Phineas Inches, MD   1 mg at 04/04/21 0825  ? Followed by  ? [START ON 04/05/2021] LORazepam (ATIVAN) tablet 1 mg  1 mg Oral TID Phineas Inches, MD      ? Followed by  ? Melene Muller ON 04/06/2021] LORazepam (ATIVAN) tablet 1 mg  1 mg Oral BID Massengill, Harrold Donath, MD      ? Followed by  ? Melene Muller ON 04/07/2021] LORazepam (ATIVAN) tablet 1 mg  1 mg Oral Daily Massengill, Nathan, MD      ? LORazepam (ATIVAN) tablet 1 mg  1 mg Oral Q6H PRN Massengill, Nathan, MD      ? magnesium hydroxide (MILK OF MAGNESIA) suspension 30 mL  30 mL Oral Daily PRN  Ntuen, Jesusita Oka, FNP      ? multivitamin with minerals tablet 1 tablet  1 tablet Oral Daily Massengill, Harrold Donath, MD   1 tablet at 04/04/21 1607  ? ondansetron (ZOFRAN-ODT) disintegrating tablet 4 mg  4 mg Oral Q6H PRN Massengill, Harrold Donath, MD      ? risperiDONE (RISPERDAL M-TABS) disintegrating tablet 2 mg  2 mg Oral Q8H PRN Massengill, Nathan, MD      ? And  ? ziprasidone (GEODON) injection 20 mg  20 mg Intramuscular Q8H PRN Massengill, Harrold Donath, MD      ? thiamine (B-1) injection 100 mg  100 mg Intramuscular Once Massengill, Harrold Donath, MD      ? thiamine tablet 100 mg  100 mg Oral Daily Massengill, Harrold Donath, MD   100 mg at 04/04/21 3710  ? traZODone (DESYREL) tablet 50 mg  50 mg Oral QHS PRN Phineas Inches, MD   50 mg at 04/03/21 2112  ? ?PTA Medications: ?Medications Prior to Admission  ?Medication Sig Dispense Refill Last Dose  ? gabapentin (NEURONTIN) 300 MG capsule Take 1 capsule (300 mg total) by mouth 3 (three) times daily. (Patient not taking: Reported on 04/03/2021) 90 capsule 0   ? sertraline (ZOLOFT) 50 MG tablet Take  1 tablet (50 mg total) by mouth daily. (Patient not taking: Reported on 04/03/2021) 30 tablet 0   ? ? ?Patient Stressors: Loss of relationship   ?Substance abuse   ? ?Patient Strengths: Average or above average intelligence  ?Communication skills  ?Supportive family/friends  ? ?Treatment Modalities: Medication Management, Group therapy, Case management,  ?1 to 1 session with clinician, Psychoeducation, Recreational therapy. ? ? ?Physician Treatment Plan for Primary Diagnosis: MDD (major depressive disorder), recurrent, severe, with psychosis (HCC) ?Long Term Goal(s):    ? ?Short Term Goals:   ? ?Medication Management: Evaluate patient's response, side effects, and tolerance of medication regimen. ? ?Therapeutic Interventions: 1 to 1 sessions, Unit Group sessions and Medication administration. ? ?Evaluation of Outcomes: Progressing ? ?Physician Treatment Plan for Secondary Diagnosis: Principal  Problem: ?  MDD (major depressive disorder), recurrent, severe, with psychosis (HCC) ?Active Problems: ?  MDD (major depressive disorder), single episode ? ?Long Term Goal(s):    ? ?Short Term Goals:      ? ?Medication Management: Evaluate patient's response, side effects, and tolerance of medication regimen. ? ?Therapeutic Interventions: 1 to 1 sessions, Unit Group sessions and Medication administration. ? ?Evaluation of Outcomes: Progressing ? ? ?RN Treatment Plan for Primary Diagnosis: MDD (major depressive disorder), recurrent, severe, with psychosis (HCC) ?Long Term Goal(s): Knowledge of disease and therapeutic regimen to maintain health will improve ? ?Short Term Goals: Ability to remain free from injury will improve, Ability to verbalize frustration and anger appropriately will improve, Ability to demonstrate self-control, Ability to participate in decision making will improve, Ability to verbalize feelings will improve, Ability to disclose and discuss suicidal ideas, Ability to identify and develop effective coping behaviors will improve, and Compliance with prescribed medications will improve ? ?Medication Management: RN will administer medications as ordered by provider, will assess and evaluate patient's response and provide education to patient for prescribed medication. RN will report any adverse and/or side effects to prescribing provider. ? ?Therapeutic Interventions: 1 on 1 counseling sessions, Psychoeducation, Medication administration, Evaluate responses to treatment, Monitor vital signs and CBGs as ordered, Perform/monitor CIWA, COWS, AIMS and Fall Risk screenings as ordered, Perform wound care treatments as ordered. ? ?Evaluation of Outcomes: Progressing ? ? ?LCSW Treatment Plan for Primary Diagnosis: MDD (major depressive disorder), recurrent, severe, with psychosis (HCC) ?Long Term Goal(s): Safe transition to appropriate next level of care at discharge, Engage patient in therapeutic group  addressing interpersonal concerns. ? ?Short Term Goals: Engage patient in aftercare planning with referrals and resources, Increase social support, Increase ability to appropriately verbalize feelings, Increase emotional regulation, Facilitate acceptance of mental health diagnosis and concerns, Facilitate patient progression through stages of change regarding substance use diagnoses and concerns, Identify triggers associated with mental health/substance abuse issues, and Increase skills for wellness and recovery ? ?Therapeutic Interventions: Assess for all discharge needs, 1 to 1 time with Child psychotherapistocial worker, Explore available resources and support systems, Assess for adequacy in community support network, Educate family and significant other(s) on suicide prevention, Complete Psychosocial Assessment, Interpersonal group therapy. ? ?Evaluation of Outcomes: Progressing ? ? ?Progress in Treatment: ?Attending groups: No. ?Participating in groups: No. ?Taking medication as prescribed: Yes. ?Toleration medication: Yes. ?Family/Significant other contact made: No, will contact Jetty PeeksJoy Sellick ?Patient understands diagnosis: Yes. ?Discussing patient identified problems/goals with staff: Yes. ?Medical problems stabilized or resolved: Yes. ?Denies suicidal/homicidal ideation: Yes. ?Issues/concerns per patient self-inventory: No. ?Other: none reported ? ?New problem(s) identified: No, Describe:  none reported ? ?New Short Term/Long Term Goal(s): ? ?medication stabilization,  elimination of SI thoughts, development of comprehensive mental wellness plan.  ? ? ?Patient Goals:  Patient states he would like to be stable and handle things on his own.  ? ?Discharge Plan or Barriers: Patient recently admitted. CSW will continue to follow and assess for appropriate referrals and possible discharge planning.  ? ? ?Reason for Continuation of Hospitalization: Anxiety ?Withdrawal symptoms ? ?Estimated Length of Stay:3-5 days ? ? ?Scribe for  Treatment Team: ?Beatris Si, LCSW ?04/04/2021 ?11:09 AM ?

## 2021-04-04 NOTE — Hospital Course (Signed)
Thomas [name] is a 31 y.o. male h/o MDD, GAD, alcohol abuse patient who initially presented to [facility] under IVC for suicidal ideation and generalized anxiety. Patient has psychiatric history of Major depressive disorder, recurrent episode severe, Insomnia due to anxiety and fear, Substance induced mood disorder, Alcohol use disorder, and Generalized Anxiety disorder. Her blood alcohol level was 374.  Patient was started on CIWA with Ativan taper, Zoloft and Naltrexone.  ?

## 2021-04-04 NOTE — BHH Counselor (Signed)
Adult Comprehensive Assessment ? ?Patient ID: Thomas [name], male   DOB: [birth year], 31 y.o.   MRN: [id number] ? ? Information Source: ?Information source: Patient ?  ?Current Stressors:  ?Patient states their primary concerns and needs for treatment are:: "Anxiety and substance use" ?Patient states their goals for this hospitilization and ongoing recovery are:: "To get out and handle my substance use on my own"  ?Employment / Job issues: Pt reports he was fired from his job in January 2023  ?Family Relationships: Pt reports conflict with his family about his alcohol use.  ?Financial/Lack of resources: Pt reports having no income ?Housing/Lack of Housing: Pt reports living with his father  ?Physical Health, including injuries and life-threatening diseases: Pt reports no stressors ?Social relationships: Pt reports no stressors  ?Substance abuse: Pt reports drinking approximately 15 beers daily  ?Bereavement/Loss: Pt reports no stressors  ?  ?Living/Environment/Situation:  ?Living Arrangements: Parent/House ?Living conditions (as described by patient or guardian): Pt reports some conflict in the home due to his alcohol use  ?Who else lives in the home?: Step-mother and step-sister (age 50)  ?How long has patient lived in current situation?: 10 years  ?What is atmosphere in current home: Comfortable, Supportive  ?  ?Family History:  ?Marital status: Single  ?Long term relationship, how long?: N/A ?What types of issues is patient dealing with in the relationship?: N/A ?Are you sexually active?: No ?What is your sexual orientation?: Heterosexual ?Has your sexual activity been affected by drugs, alcohol, medication, or emotional stress?: No  ?Does patient have children?: No ?  ?Childhood History:  ?By whom was/is the patient raised?: Both parents ?Additional childhood history information: pt stated that his parents seperated when he was 72 years old ?Description of patient's relationship with caregiver when they were a  child: "Good. My dad was a serious person. He wasn't talkative and wasn't around much." ?Patient's description of current relationship with people who raised him/her: Dad: close Mom: close but have been arguing the past couple of weeks bc of the pt's drinking ?How were you disciplined when you got in trouble as a child/adolescent?: "spanked" ?Does patient have siblings?: Yes ?Number of Siblings: 1 ?Description of patient's current relationship with siblings: "we have never really been close. When I try to talk to her about this stuff she just yells at me and gets mad." ?Did patient suffer any verbal/emotional/physical/sexual abuse as a child?: No ?Did patient suffer from severe childhood neglect?: No ?Has patient ever been sexually abused/assaulted/raped as an adolescent or adult?: No ?Was the patient ever a victim of a crime or a disaster?: No ?Witnessed domestic violence?: No ?Has patient been affected by domestic violence as an adult?: No ?  ?Education:  ?Highest grade of school patient has completed: 2 year degree ?Currently a student?: No ?Learning disability?: No ?  ?Employment/Work Situation:   ?Employment situation: Unemployed ?Where is patient currently employed?: N/A ?How long has patient been employed?: N/A ?Patient's job has been impacted by current illness: No ?Describe how patient's job has been impacted: N/A ?What is the longest time patient has a held a job?: 5 years ?Where was the patient employed at that time?: [employer] ?Has patient ever been in the [employer]?: No ?  ?Financial Resources:   ?Financial resources: No income and no medical insurance  ?Does patient have a representative payee or guardian?: No ?  ?Alcohol/Substance Abuse:   ?What has been your use of drugs/alcohol within the last 12 months?: Pt reports drinking approximately 15 beers  daily.  ?If attempted suicide, did drugs/alcohol play a role in this?: No  ?Alcohol/Substance Abuse Treatment Hx: Denies past history ?Has alcohol/substance  abuse ever caused legal problems?: Yes (DUI in 2016) ?  ?Social Support System:   ?Patient's Community Support System: Good ?Describe Community Support System: Family and friends  ?Type of faith/religion: None  ?  ?Leisure/Recreation:   ?Do You Have Hobbies?: Yes ?Leisure and Hobbies: Visiting with family  ?  ?Strengths/Needs:   ?What is the patient's perception of their strengths?: Being a hard worker and resilient  ?  ?Discharge Plan:   ?Currently receiving community mental health services: No, Pt is interested in therapy and medication management services in the [facility] area.  Pt does not wish to attend any inpatient or outpatient substance use treatment at this time.  Pt also declined a list of local NA and AA meetings.  ?Does patient have access to transportation?: Yes, Pt reports having his own car at home  ?Does patient have financial barriers related to discharge medications?: No income and no medical insurance  ?Will patient be returning to same living situation after discharge?: Yes ?  ? ?Summary/Recommendations:   ?Summary and Recommendations (to be completed by the evaluator): Thomas [name] is a 31 year old, male, who was admitted to the hospital due to suicidal thoughts, anxiety, and substance use.  The Pt deneis all suicidal thoughts at this time and also denies that his alcohol use is a concern.  The Pt reports that he is currently living with his father, step-mother, and step-sister (age 73).  He states reports some family conflict with his mother, father, and sister about his alcohol use.  He denies all childhood trauma or abuse.  The Pt reports being unemployed and states that he was fired from his jon in [place] 2023.  He states that he has money saved up but does not wish to spend it.  He reports no additional income from his parents.  The Pt reports drinking approximately 15 beers a day and states that he received a DUI in 2016.  He denies all previous and current substance use  treatment and states that he is not interested in attending inpatient or outpatient substance use services at this time.  While in the hospital the Pt can benefit from crisis stabilization, medication evaluation, group therapy, psycho-education, case management, and discharge planning.  Upon discharge the Pt would like to return to either his mother or father's home and follow up with a local outpatient provider for therapy and medication management services. ? ?Aram Beecham. 04/04/2021 ?

## 2021-04-04 NOTE — Plan of Care (Signed)
  Problem: Coping: Goal: Ability to verbalize frustrations and anger appropriately will improve Outcome: Progressing Goal: Ability to demonstrate self-control will improve Outcome: Progressing   Problem: Activity: Goal: Interest or engagement in activities will improve Outcome: Progressing Goal: Sleeping patterns will improve Outcome: Progressing   

## 2021-04-04 NOTE — Progress Notes (Signed)
? ? ?   04/03/21 2112  ?Psych Admission Type (Psych Patients Only)  ?Admission Status Involuntary  ?Psychosocial Assessment  ?Patient Complaints Anxiety;Agitation;Insomnia  ?Eye Contact Fair  ?Facial Expression Anxious  ?Affect Anxious  ?Speech Soft  ?Interaction Forwards little  ?Motor Activity Fidgety  ?Appearance/Hygiene Body odor;Disheveled  ?Behavior Characteristics Cooperative  ?Mood Anxious  ?Thought Process  ?Coherency WDL  ?Content Blaming others  ?Delusions WDL  ?Perception WDL  ?Hallucination None reported or observed  ?Judgment WDL  ?Confusion WDL  ?Danger to Self  ?Current suicidal ideation? Denies  ?Self-Injurious Behavior No self-injurious ideation or behavior indicators observed or expressed   ?Agreement Not to Harm Self Yes  ?Description of Agreement verbal contract for safety  ?Danger to Others  ?Danger to Others Reported or observed  ? ? ?
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
## 2021-04-04 NOTE — Group Note (Signed)
LCSW Group Therapy Note ? ? ?Group Date: 04/04/2021 ?Start Time: 1300 ?End Time: 1400 ? ?Topic: Problem Solving  ?  ?Participation: Active  ?  ?Due to the illness on the unit, Infectious Disease has recommended no close contact at this time, therefore group was not held. Patient was provided therapeutic worksheets and asked to meet with CSW to discuss those worksheets as needed.  ? ?Darleen Crocker, LCSWA ?04/04/2021  1:19 PM   ? ?

## 2021-04-04 NOTE — Progress Notes (Signed)
?   04/04/21 0800  ?Psych Admission Type (Psych Patients Only)  ?Admission Status Involuntary  ?Psychosocial Assessment  ?Patient Complaints Anxiety  ?Eye Contact Fair  ?Facial Expression Anxious  ?Speech Soft  ?Interaction Cautious  ?Motor Activity Other (Comment) ?(Steady)  ?Appearance/Hygiene Disheveled  ?Behavior Characteristics Cooperative  ?Mood Anxious  ?Thought Process  ?Coherency WDL  ?Content Blaming self  ?Delusions WDL  ?Perception WDL  ?Hallucination None reported or observed  ?Judgment WDL  ?Confusion WDL  ?Danger to Self  ?Current suicidal ideation? Denies  ?Self-Injurious Behavior No self-injurious ideation or behavior indicators observed or expressed   ?Agreement Not to Harm Self Yes  ?Description of Agreement Verbal contract for safety  ?Danger to Others  ?Danger to Others None reported or observed  ?Danger to Others Abnormal  ?Harmful Behavior to others No threats or harm toward other people  ?Destructive Behavior No threats or harm toward property  ? ?Patient is polite and pleasant on approach, alert and oriented X's 4 during shift assessment. Patient is compliant with routine medication with no side effect noted. Patient denies SI/HI/AVH. No distress noted at this time, Q 15 minutes safety observation in place. Staff will continue to monitor. ?

## 2021-04-04 NOTE — Plan of Care (Signed)
  Problem: Education: Goal: Emotional status will improve Outcome: Not Progressing Goal: Mental status will improve Outcome: Not Progressing   

## 2021-04-04 NOTE — [facility] Suicide Risk Assessment (Addendum)
Suicide Risk Assessment ? ?Admission Assessment    ?[facility] Admission Suicide Risk Assessment ? ? ?Nursing information obtained from:  Patient ?Demographic factors:  Male, Low socioeconomic status, Caucasian ?Current Mental Status:  Suicidal ideation indicated by patient, Self-harm thoughts ?Loss Factors:  Loss of significant relationship, Financial problems / change in socioeconomic status ?Historical Factors:  Impulsivity ?Risk Reduction Factors:  Living with another person, especially a relative ? ?Total Time spent with patient: 45 minutes ?Principal Problem: MDD (major depressive disorder), recurrent severe, without psychosis (HCC) ?Diagnosis:  Principal Problem: ?  MDD (major depressive disorder), recurrent severe, without psychosis (HCC) ?Active Problems: ?  GAD (generalized anxiety disorder) ?  Alcohol use disorder, moderate, dependence (HCC) ? 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  Patient states since then his anxiety has worsened and he started drinking a lot.  He states that he does not have any drinking problem but drinks just to relieve his anxiety.  He admits that he sent a text message to his mom and sister stating that he was going to get a gun and is getting it ready claiming he is going to pay someone to kill him.  He reports that he sent this message under the influence of alcohol and he was not feeling suicidal at all.  He reports that that text message was totally miscommunication with his family.  He reports that his mom and dad argue a lot and they think that he has a drinking problem but he drinks only because he does not have a job.  He reports that he tried to tell them that medicine and therapy won't help but only getting a job will help him.  He also had a girlfriend last year with whom he broke up because he went to her workplace.  He lost his job due to the same reason. ?He reports feeling stressed on  and off but does not report depressed mood.  He reports poor sleep and only sleeps for 4 hours at night.  He reports that he drinks because it helps him with sleep.  He reports poor appetite, anhedonia, feeling tired and fatigue, low energy, poor memory when he drinks, poor concentration and feeling guilty about his job.  Currently, he denies any active or passive suicidal ideations, homicidal ideations, auditory and visual hallucinations.  He denies any manic symptoms including high energy episodes, pressured speech, increased spending, flight of ideas, grandiosity, irritability and high risk-taking behaviors.  He reports feeling angry due to current situation.  He denies any paranoia.  He reports feeling anxious about everything but talks mainly about him being unemployed.  He reports panic attacks multiple times in a day.  He denies any history of physical, verbal, sexual abuse. ?Discussed starting Zoloft for depression and naltrexone for alcohol cravings.  After a lot of convincing, patient agrees with starting Zoloft for depression and anxiety.  He agrees with starting naltrexone.  Patient is not interested in substance abuse rehab programs at this time. ?  ?Past Psychiatric Hx: ?Previous Psych Diagnoses: Anxiety, MDD, Insomnia, Alcohol use disorder, GAD ?Prior inpatient treatment:1 time inpatient [facility] 2021 IVCed for SI, Multiple ED visits since Jan for anxiety, alcohol abuse, and SI. ?Current/prior outpatient treatment:  Tried Trazodone (helps), Gabapentin (helped), Natrexone (helped), Benadryl , Seroquel (helped), Zoloft (helped).  Took  for 2-3 weeks after last discharge. He was feeling happy so stopped.  ?Prior rehab hx: None ?Psychotherapy  hx: Got therapy 2 years ago for 5 visits. Didn't help mnuch. ?History of suicide: never . Just had thoughts.  ?History of homicide: Denies  ?Psychiatric medication history: ?Psychiatric medication compliance history: not complaint ?Neuromodulation history: Denies   ?Current Psychiatrist:Denies  ?Current therapist: Denies  ?  ?Substance Abuse Hx: ?Alcohol:  Average 15 beer /day . Max 20 beer a day. 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 Was working in [place], lost job in [place]. ?Housing: Lives in [place], with Mom, dad, and Sister and her 2 kids (5, 3) ?Legal: None ?Guns: none ? ?Continued Clinical Symptoms:  ?  ?The "Alcohol Use Disorders Identification Test", Guidelines for Use in Primary Care, Second Edition.  World Science writer [facility]). ?Score between 0-7:  no or low risk or alcohol related problems. ?Score between 8-15:  moderate risk of alcohol related problems. ?Score between 16-19:  high risk of alcohol related problems. ?Score 20 or above:  warrants further diagnostic evaluation for alcohol dependence and treatment. ? ? ?CLINICAL FACTORS:  ? Severe Anxiety and/or Agitation ?Panic Attacks ?Depression:   Anhedonia ?Comorbid alcohol abuse/dependence ?Impulsivity ?Insomnia ?Severe ?Alcohol/Substance Abuse/Dependencies ?More than one psychiatric diagnosis ?Unstable or Poor Therapeutic Relationship ?Previous Psychiatric Diagnoses and Treatments ? ? ?Musculoskeletal: ?Strength & Muscle Tone: within normal limits ?Gait & Station: normal ?Patient leans: N/A ? ?Psychiatric Specialty Exam: ? ?Presentation  ?General Appearance: Appropriate for  Environment ? ?Eye Contact:Fair ? ?Speech:Clear and Coherent; Normal Rate ? ?Speech Volume:Normal ? ?Handedness:Right ? ? ?Mood and Affect  ?Mood:Anxious; Dysphoric ? ?Affect:Constricted; Restricted; Congruent ? ? ?Thought Process  ?Thought Processes:Coherent ? ?Descriptions of Associations:Intact ? ?Orientation:Full (Time, Place and Person) ? ?Thought Content:Logical ? ?History of Schizophrenia/Schizoaffective disorder:No ? ?Duration of Psychotic Symptoms:No data recorded ?Hallucinations:Hallucinations: None ? ?Ideas of Reference:None ? ?Suicidal Thoughts:Suicidal Thoughts: No ? ?Homicidal Thoughts:Homicidal Thoughts: No ? ? ?Sensorium  ?Memory:Immediate Fair; Recent Fair; Remote Fair ? ?Judgment:Poor ? ?Insight:Shallow ? ? ?Executive Functions  ?Concentration:Fair ? ?Attention Span:Fair ? ?Recall:Fair ? ?Fund of Knowledge:Fair ? ?Language:Good ? ? ?Psychomotor Activity  ?Psychomotor Activity:Psychomotor Activity: Restlessness ? ? ?Assets  ?Assets:Communication Skills; Physical Health; Social Support ? ? ?Sleep  ?Sleep:Sleep: Poor ?Number of Hours of Sleep: 8 ? ? ? ?Physical Exam: ?Physical Exam see h&P ?ROS see H&P ?Blood pressure 115/71, pulse 87, temperature 98.1 ?F (36.7 ?C), temperature source Oral, resp. rate 20, height 6' (1.829 m), weight 70.3 kg, SpO2 98 %. Body mass index is 21.02 kg/m?. ? ? ?COGNITIVE FEATURES THAT CONTRIBUTE TO RISK:  ?Closed-mindedness and Thought constriction (tunnel vision)   ? ?SUICIDE RISK:  ? Moderate:  Frequent suicidal ideation with limited intensity, and duration, some specificity in terms of plans, no associated intent, good self-control, limited dysphoria/symptomatology, some risk factors present, and identifiable protective factors, including available and accessible social support. ? ?PLAN OF CARE: Patient needs inpatient admission for stabilization of his symptoms.   ?See H&P for complete treatment plan.  ? ?I certify that inpatient services furnished  can reasonably be  expected to improve the patient's condition.  ? ?Karsten RoVandana  Doda, MD ?04/04/2021, 8:08 PM ? ?Total Time Spent in Direct Patient Care:  ?I personally spent 60 minutes on the unit in direct patient care. The direct patient care time

## 2021-04-04 NOTE — Progress Notes (Signed)
NUTRITION ASSESSMENT ? ?Pt identified as at risk on the Malnutrition Screen Tool ? ?INTERVENTION: ?1. Ensure Plus High Protein po BID, each supplement provides 350 kcal and 20 grams of protein.  ?2. Consider addition of folic acid given alcohol abuse ? ?NUTRITION DIAGNOSIS: ?Unintentional weight loss related to sub-optimal intake as evidenced by pt report.  ? ?Goal: ?Pt to meet >/= 90% of their estimated nutrition needs. ? ?Monitor:  ?PO intake ? ?Assessment:  ?Pt admitted with anxiety, severe depression and substance abuse (alcohol, 30+ beers daily). UDS+ for THC. ?Pt with poor appetite. Per weight records, pt has lost weight since 2021. Will order Ensure supplements. Has been ordered daily MVI and thiamine given alcohol abuse. ? ?Height: ?Ht Readings from Last 1 Encounters:  ?04/03/21 6' (1.829 m)  ? ? ?Weight: ?Wt Readings from Last 1 Encounters:  ?04/03/21 70.3 kg  ? ? ?Weight Hx: ?Wt Readings from Last 10 Encounters:  ?04/03/21 70.3 kg  ?04/02/21 68 kg  ?01/21/21 72.6 kg  ?11/17/19 72.6 kg  ?10/30/19 73 kg  ?08/08/19 77.1 kg  ?07/16/13 67.6 kg  ?01/28/12 68 kg  ? ? ?BMI:  Body mass index is 21.02 kg/m?Marland Kitchen ?Pt meets criteria for normal based on current BMI. ? ?Estimated Nutritional Needs: ?Kcal: 25-30 kcal/kg ?Protein: > 1 gram protein/kg ?Fluid: 1 ml/kcal ? ?Diet Order:  ?Diet Order   ? ?       ?  Diet regular Room service appropriate? Yes; Fluid consistency: Thin  Diet effective now       ?  ? ?  ?  ? ?  ? ?Pt is also offered choice of unit snacks mid-morning and mid-afternoon.  ?Pt is eating as desired.  ? ?Lab results and medications reviewed.  ? ?Tilda Franco, MS, RD, LDN ?Inpatient Clinical Dietitian ?Contact information available via Amion ? ? ?
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
## 2021-04-05 NOTE — Progress Notes (Signed)
The group did not occur on the hallway due to the ongoing virus.  ?

## 2021-04-05 NOTE — Progress Notes (Signed)
Pt is A&OX4, anxious, denies suicidal ideations, denies homicidal ideations, denies auditory hallucinations and denies visual hallucinations. Pt denies experiencing nightmares. Mood and affect are congruent. Pt appetite is ok. No complaints of distress, pain and/or discomfort at this time. Pt is displaying hand tremors. Pt reports drinking 10-15 beers daily for the past two months after losing his job. Pt's memory appears to be grossly intact, and Pt hasn?t displayed any injurious behaviors. Pt is medication compliant. There?s no evidence of suicidal intent. Psychomotor activity was WNL. No s/s of Parkinson, Dystonia, Akathisia and/or Tardive Dyskinesia noted. ?

## 2021-04-05 NOTE — [facility] Group Notes (Signed)
Group Date: 04/05/2021 ?Start Time: 10:00am ?End Time: 11:00am ?  ?  ?Type of Therapy and Topic:  Group Therapy: Anger ?  ?Participation Level:  Active ?  ?Description of Group:   Due to the illness on the unit, Infectious Disease has recommended no close contact at this time so group was not held.  Patient was provided with written information and worksheets about anger.  The patient was sleeping so CSW could not review the information with him. ?  ?  ?Selmer Dominion, LCSW ?04/05/2021 ?

## 2021-04-05 NOTE — Progress Notes (Signed)
?   04/04/21 2300  ?Psych Admission Type (Psych Patients Only)  ?Admission Status Involuntary  ?Psychosocial Assessment  ?Patient Complaints Isolation  ?Eye Contact Fair  ?Facial Expression Anxious  ?Affect Depressed  ?Speech Soft  ?Interaction Cautious  ?Motor Activity Slow  ?Appearance/Hygiene In scrubs  ?Behavior Characteristics Appropriate to situation  ?Mood Depressed;Anxious  ?Thought Process  ?Coherency WDL  ?Content WDL  ?Delusions None reported or observed  ?Perception WDL  ?Hallucination None reported or observed  ?Judgment WDL  ?Confusion None  ?Danger to Self  ?Current suicidal ideation? Denies  ?Self-Injurious Behavior No self-injurious ideation or behavior indicators observed or expressed   ?Agreement Not to Harm Self Yes  ?Description of Agreement verbal  ?Danger to Others  ?Danger to Others None reported or observed  ?Danger to Others Abnormal  ?Harmful Behavior to others No threats or harm toward other people  ?Destructive Behavior No threats or harm toward property  ? ? ?

## 2021-04-06 DIAGNOSIS — F332 Major depressive disorder, recurrent severe without psychotic features: Secondary | ICD-10-CM

## 2021-04-06 LAB — COMPREHENSIVE METABOLIC PANEL
ALT: 56 U/L — ABNORMAL HIGH (ref 0–44)
AST: 38 U/L (ref 15–41)
Albumin: 4.2 g/dL (ref 3.5–5.0)
Alkaline Phosphatase: 72 U/L (ref 38–126)
Anion gap: 9 (ref 5–15)
BUN: 8 mg/dL (ref 6–20)
CO2: 27 mmol/L (ref 22–32)
Calcium: 9.6 mg/dL (ref 8.9–10.3)
Chloride: 101 mmol/L (ref 98–111)
Creatinine, Ser: 0.87 mg/dL (ref 0.61–1.24)
GFR, Estimated: >60 mL/min (ref >60)
Glucose, Bld: 107 mg/dL — ABNORMAL HIGH (ref 70–99)
Potassium: 3.7 mmol/L (ref 3.5–5.1)
Sodium: 137 mmol/L (ref 135–145)
Total Bilirubin: 0.9 mg/dL (ref 0.3–1.2)
Total Protein: 7.8 g/dL (ref 6.5–8.1)

## 2021-04-06 LAB — URINALYSIS, COMPLETE (UACMP) WITH MICROSCOPIC
Bacteria, UA: NONE SEEN
Bilirubin Urine: NEGATIVE
Glucose, UA: NEGATIVE mg/dL
Hgb urine dipstick: NEGATIVE
Ketones, ur: NEGATIVE mg/dL
Leukocytes,Ua: NEGATIVE
Nitrite: NEGATIVE
Protein, ur: NEGATIVE mg/dL
Specific Gravity, Urine: 1.021 (ref 1.005–1.030)
pH: 6 (ref 5.0–8.0)
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
## 2021-04-06 NOTE — Group Note (Signed)
LCSW Group Therapy ? ? ?CSW group not facilitated due to unit limitations on patient proximity/interactions due to infection prevention measures. ? ?AURELIANO OSHIELDS LCSWA  ?10:39 AM  ?

## 2021-04-06 NOTE — Progress Notes (Signed)
Pt is A&OX4, calm, denies suicidal ideations, denies homicidal ideations, denies auditory hallucinations and denies visual hallucinations. Pt reports sleeping well and denies experiencing nightmares. "I slept all night." Euthymic mood. Mood and affect are congruent. Pt appetite is ok. No complaints of distress, pain and/or discomfort at this time. Pt's memory appears to be grossly intact, and Pt hasn?t displayed any injurious behaviors. Pt is medication compliant. There?s no evidence of suicidal intent. Psychomotor activity was WNL. No s/s of Parkinson, Dystonia, Akathisia and/or Tardive Dyskinesia noted. ?

## 2021-04-07 ENCOUNTER — Telehealth (HOSPITAL_COMMUNITY): Payer: Federal, State, Local not specified - Other | Admitting: Psychiatry

## 2021-04-07 ENCOUNTER — Other Ambulatory Visit (HOSPITAL_COMMUNITY): Payer: Self-pay

## 2021-04-07 MED ORDER — TRAZODONE HCL 50 MG PO TABS
50.0000 mg | ORAL_TABLET | Freq: Every evening | ORAL | 0 refills | Status: DC | PRN
Start: 2021-04-07 — End: 2021-06-23
  Filled 2021-04-07: qty 30, 30d supply, fill #0

## 2021-04-07 MED ORDER — SERTRALINE HCL 50 MG PO TABS
50.0000 mg | ORAL_TABLET | Freq: Every day | ORAL | 0 refills | Status: DC
  Filled 2021-04-07: qty 30, 30d supply, fill #0

## 2021-04-07 MED ORDER — NALTREXONE HCL 50 MG PO TABS
50.0000 mg | ORAL_TABLET | Freq: Every day | ORAL | 0 refills | Status: AC
  Filled 2021-04-07: qty 30, 30d supply, fill #0

## 2021-04-07 MED ORDER — GABAPENTIN 300 MG PO CAPS
300.0000 mg | ORAL_CAPSULE | Freq: Three times a day (TID) | ORAL | 0 refills | Status: DC
  Filled 2021-04-07: qty 90, 30d supply, fill #0

## 2021-04-07 NOTE — Group Note (Signed)
Recreation Therapy Group Note ? ? ?Group Topic:Communication  ?Group Date: 04/07/2021 ?Start Time: 1055 ?End Time: 1125 ?Facilitators: Caroll Rancher, LRT,CTRS ?Location: [address] Dayroom ? ? ?Goal Area(s) Addresses:  ?Patient will effectively listen to complete activity.  ?Patient will identify communication skills used to make activity successful.  ?Patient will identify how skills used during activity can be used to reach post d/c goals.  ?  ?Group Description: Geometric Drawings.  Due to patients being confined to rooms, group was held in the hallway.  Three volunteers from the peer group will be shown an abstract picture with a particular arrangement of geometrical shapes.  Each round, one 'speaker' will describe the pattern, as accurately as possible without revealing the image to the group.  The remaining group members will listen and draw the picture to reflect how it is described to them. Patients with the role of 'listener' cannot ask clarifying questions but, may request that the speaker repeat a direction. Once the drawings are complete, the presenter will show the rest of the group the picture and compare how close each person came to drawing the picture. LRT will facilitate a post-activity discussion regarding effective communication and the importance of planning, listening, and asking for clarification in daily interactions with others. ? ? ?Affect/Mood: N/A ?  ?Participation Level: Did not attend ?  ? ?Clinical Observations/Individualized Feedback:   ? ? ?Plan: Continue to engage patient in RT group sessions 2-3x/week. ? ? ?Caroll Rancher, LRT,CTRS ?04/07/2021 12:56 PM ?

## 2021-04-07 NOTE — Progress Notes (Signed)
?   04/06/21 2300  ?Psych Admission Type (Psych Patients Only)  ?Admission Status Involuntary  ?Psychosocial Assessment  ?Patient Complaints None  ?Eye Contact Fair  ?Facial Expression Flat  ?Affect Appropriate to circumstance  ?Speech Logical/coherent  ?Interaction Assertive  ?Motor Activity Slow  ?Appearance/Hygiene Improved  ?Behavior Characteristics Appropriate to situation;Cooperative  ?Mood Despair  ?Thought Process  ?Coherency WDL  ?Content WDL  ?Delusions None reported or observed  ?Perception WDL  ?Hallucination None reported or observed  ?Judgment WDL  ?Confusion None  ?Danger to Self  ?Current suicidal ideation? Denies  ?Self-Injurious Behavior No self-injurious ideation or behavior indicators observed or expressed   ?Agreement Not to Harm Self Yes  ?Description of Agreement verbal  ?Danger to Others  ?Danger to Others None reported or observed  ?Danger to Others Abnormal  ?Harmful Behavior to others No threats or harm toward other people  ?Destructive Behavior No threats or harm toward property  ? ? ?
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
## 2021-04-07 NOTE — Group Note (Signed)
LCSW Therapy Group Note ?  ?Group Date: 04/05/2021 ?Start Time: 11:30am ?End Time: 12:00pm ?  ?  ?Type of Therapy and Topic:  Group Therapy: Circle of Control ?  ?Participation Level:  Active ?  ?Description of Group:   Due to the acuity on the unit, staff recommended no close contact at this time so group was not held.  Patient was provided with written information and worksheets about circle of control.  CSW could not go over information with the patient, as she was on the phone. ?  ?  ?Kalli Greenfield MSW, LCSW ?Clincal Social Worker  ?[facility]  ?

## 2021-04-08 NOTE — Discharge Summary (Signed)
Physician Discharge Summary Note ? ?Patient:  Thomas [name] is an 31 y.o., male ?MRN:  [id number] ?DOB:  01/26/1990 ?Patient phone:  [phone] (home)  ?Patient address:   ?[address] ?[place] [address],  ?Total Time spent with patient: 30 minutes ? ?Date of Admission:  04/03/2021 ?Date of Discharge: 04-07-2021 ? ?Reason for Admission:   ?Patient is a  with past psychiatric history of  Anxiety, MDD, Insomnia, Alcohol use disorder, and GAD with no past medical history who initially presented to [facility] long ED via involuntary commitment for suicidal ideation and generalized anxiety ? ?Principal Problem: MDD (major depressive disorder), recurrent severe, without psychosis (HCC) ?Discharge Diagnoses: Principal Problem: ?  MDD (major depressive disorder), recurrent severe, without psychosis (HCC) ?Active Problems: ?  GAD (generalized anxiety disorder) ?  Alcohol use disorder, moderate, dependence (HCC) ? ? ?Past Psychiatric History:  ? ? ?Past Medical History:  ?Past Medical History:  ?Diagnosis Date  ? Anxiety   ?  ?Past Surgical History:  ?Procedure Laterality Date  ? APPENDECTOMY    ? ?Family History: History reviewed. No pertinent family history. ? ?Family Psychiatric  History:  ?Previous Psych Diagnoses: Anxiety, MDD, Insomnia, Alcohol use disorder, GAD ?Prior inpatient treatment:1 time inpatient [facility] 2021 IVCed for SI, Multiple ED visits since Jan for anxiety, alcohol abuse, and SI. ?Current/prior outpatient treatment:  Tried Trazodone (helps), Gabapentin (helped), Natrexone (helped), Benadryl , Seroquel (helped), Zoloft (helped).  Took  for 2-3 weeks after last discharge. He was feeling happy so stopped.  ?Prior rehab hx: None ?Psychotherapy hx: Got therapy 2 years ago for 5 visits. Didn't help mnuch. ?History of suicide: never . Just had thoughts.  ?History of homicide: Denies  ?Psychiatric medication history: ?Psychiatric medication compliance history: not complaint ?Neuromodulation history:  Denies  ?Current Psychiatrist:Denies  ?Current therapist: Denies  ? ?Social History:  ?Social History  ? ?Substance and Sexual Activity  ?Alcohol Use Yes  ? Comment: reported 20+ beers/day  ?   ?Social History  ? ?Substance and Sexual Activity  ?Drug Use No  ?  ?Social History  ? ?Socioeconomic History  ? Marital status: Single  ?  Spouse name: Not on file  ? Number of children: Not on file  ? Years of education: Not on file  ? Highest education level: Not on file  ?Occupational History  ? Not on file  ?Tobacco Use  ? Smoking status: Never  ? Smokeless tobacco: Never  ?Vaping Use  ? Vaping Use: Never used  ?Substance and Sexual Activity  ? Alcohol use: Yes  ?  Comment: reported 20+ beers/day  ? Drug use: No  ? Sexual activity: Yes  ?Other Topics Concern  ? Not on file  ?Social History Narrative  ? Not on file  ? ?Social Determinants of Health  ? ?Financial Resource Strain: Not on file  ?Food Insecurity: Not on file  ?Transportation Needs: Not on file  ?Physical Activity: Not on file  ?Stress: Not on file  ?Social Connections: Not on file  ? ? ?Hospital Course:   ?During the patient's hospitalization, patient had extensive initial psychiatric evaluation, and follow-up psychiatric evaluations every day. ?  ?Psychiatric diagnoses provided upon initial assessment:  ?MDD severe recurrent w/o psychosis ?GAD with panic attacks ?Alcohol use disorder, severe ?  ?  ?Patient's psychiatric medications were adjusted on admission:  ?-Start Zoloft 50 mg daily for depression and anxiety.(R/b/se/a discussed and patient agrees with medication trial) ?-Start naltrexone 50 mg from tomorrow for alcohol cravings ?-Start clonidine 0.1  mg twice daily for high blood pressure ?-Start CIWA with scheduled Ativan taper and as needed Ativan for CIWA greater than 10 ?-Start Neurontin 300 mg 3 times daily for anxiety and alcohol cravings. ?-Start 40 meq K-Lor for 2 doses.  ?  ?During the hospitalization, other adjustments were made to the  patient's psychiatric medication regimen: clonidine was discontinued as it was no longer required for BP management ?  ?Gradually, patient started adjusting to milieu.   ?Patient's care was discussed during the interdisciplinary team meeting every day during the hospitalization. ?  ?The patient reported having some GI symptoms, but otherwise denies having side effects to prescribed psychiatric medication. ?  ?The patient reports their target psychiatric symptoms of alcohol withdrawal symptoms, depression, and suicidal thoughts, all responded well to the psychiatric medications, and the patient reports overall benefit other psychiatric hospitalization. Supportive psychotherapy was provided to the patient. The patient also participated in regular group therapy while admitted.  ?  ?Labs were reviewed with the patient, and abnormal results were discussed with the patient. ?  ?The patient denied having suicidal thoughts more than 48 hours prior to discharge.  Patient denies having homicidal thoughts.  Patient denies having auditory hallucinations.  Patient denies any visual hallucinations.  Patient denies having paranoid thoughts. ?  ?The patient is able to verbalize their individual safety plan to this provider. ?  ?It is recommended to the patient to continue psychiatric medications as prescribed, after discharge from the hospital.   ?  ?It is recommended to the patient to follow up with your outpatient psychiatric provider and PCP. ?  ?Discussed with the patient, the impact of alcohol, drugs, tobacco have been there overall psychiatric and medical wellbeing, and total abstinence from substance use was recommended the patient. ?  ?  ?  ? ?Physical Findings: ?AIMS: Facial and Oral Movements ?Muscles of Facial Expression: None, normal ?Lips and Perioral Area: None, normal ?Jaw: None, normal ?Tongue: None, normal,Extremity Movements ?Upper (arms, wrists, hands, fingers): None, normal ?Lower (legs, knees, ankles, toes):  None, normal, Trunk Movements ?Neck, shoulders, hips: None, normal, Overall Severity ?Severity of abnormal movements (highest score from questions above): None, normal ?Incapacitation due to abnormal movements: None, normal ?Patient's awareness of abnormal movements (rate only patient's report): No Awareness, Dental Status ?Current problems with teeth and/or dentures?: No ?Does patient usually wear dentures?: No  ?CIWA:  CIWA-Ar Total: 0 ?COWS:    ? ? ? ?Musculoskeletal: ?Strength & Muscle Tone: within normal limits ?Gait & Station: normal ?Patient leans: N/A ? ? ?Psychiatric Specialty Exam: ? ?Presentation  ?General Appearance: Casual ? ?Eye Contact:Good ? ?Speech:Clear and Coherent ? ?Speech Volume:Normal ? ?Handedness:Right ? ? ?Mood and Affect  ?Mood:Euthymic ? ?Affect:Restricted ? ? ?Thought Process  ?Thought Processes:Coherent ? ?Descriptions of Associations:Intact ? ?Orientation:Full (Time, Place and Person) ? ?Thought Content:Logical ? ?History of Schizophrenia/Schizoaffective disorder:No ? ?Duration of Psychotic Symptoms:No data recorded ?Hallucinations:Hallucinations: None ? ?Ideas of Reference:None ? ?Suicidal Thoughts:Suicidal Thoughts: No ? ?Homicidal Thoughts:Homicidal Thoughts: No ? ? ?Sensorium  ?Memory:Immediate Good; Recent Good; Remote Good ? ?Judgment:Fair ? ?Insight:Fair ? ? ?Executive Functions  ?Concentration:Fair ? ?Attention Span:Fair ? ?Recall:Fair ? ?Fund of Knowledge:Fair ? ?Language:Good ? ? ?Psychomotor Activity  ?Psychomotor Activity:Psychomotor Activity: Normal ? ? ?Assets  ?Assets:Communication Skills; Desire for Improvement; Physical Health; Social Support ? ? ?Sleep  ?Sleep:Sleep: Fair ? ? ? ?Physical Exam: ?Physical Exam ?Vitals reviewed.  ?Constitutional:   ?   General: He is not in acute distress. ?   Appearance: He is  normal weight. He is not toxic-appearing.  ?Pulmonary:  ?   Effort: Pulmonary effort is normal. No respiratory distress.  ?Neurological:  ?   Mental Status: He is  alert.  ?   Motor: No weakness.  ?   Gait: Gait normal.  ?Psychiatric:     ?   Mood and Affect: Mood normal.     ?   Behavior: Behavior normal.     ?   Thought Content: Thought content normal.     ?   Judgment

## 2021-04-08 NOTE — [facility] Suicide Risk Assessment (Signed)
[facility] Discharge Suicide Risk Assessment ? ? ?Principal Problem: MDD (major depressive disorder), recurrent severe, without psychosis (HCC) ?Discharge Diagnoses: Principal Problem: ?  MDD (major depressive disorder), recurrent severe, without psychosis (HCC) ?Active Problems: ?  GAD (generalized anxiety disorder) ?  Alcohol use disorder, moderate, dependence (HCC) ? ? ?Total Time spent with patient: 30 minutes ?Patient is a  with past psychiatric history of  Anxiety, MDD, Insomnia, Alcohol use disorder, and GAD with no past medical history who initially presented to [facility] long ED via involuntary commitment for suicidal ideation and generalized anxiety.   ? ?During the patient's hospitalization, patient had extensive initial psychiatric evaluation, and follow-up psychiatric evaluations every day. ? ?Psychiatric diagnoses provided upon initial assessment:  ?MDD severe recurrent w/o psychosis ?GAD with panic attacks ?Alcohol use disorder, severe ?  ? ?Patient's psychiatric medications were adjusted on admission:  ?-Start Zoloft 50 mg daily for depression and anxiety.(R/b/se/a discussed and patient agrees with medication trial) ?-Start naltrexone 50 mg from tomorrow for alcohol cravings ?-Start clonidine 0.1 mg twice daily for high blood pressure ?-Start CIWA with scheduled Ativan taper and as needed Ativan for CIWA greater than 10 ?-Start Neurontin 300 mg 3 times daily for anxiety and alcohol cravings. ?-Start 40 meq K-Lor for 2 doses.  ? ?During the hospitalization, other adjustments were made to the patient's psychiatric medication regimen: clonidine was discontinued as it was no longer required for BP management ? ?Gradually, patient started adjusting to milieu.   ?Patient's care was discussed during the interdisciplinary team meeting every day during the hospitalization. ? ?The patient reported having some GI symptoms, but otherwise denies having side effects to prescribed psychiatric medication. ? ?The patient  reports their target psychiatric symptoms of alcohol withdrawal symptoms, depression, and suicidal thoughts, all responded well to the psychiatric medications, and the patient reports overall benefit other psychiatric hospitalization. Supportive psychotherapy was provided to the patient. The patient also participated in regular group therapy while admitted.  ? ?Labs were reviewed with the patient, and abnormal results were discussed with the patient. ? ?The patient denied having suicidal thoughts more than 48 hours prior to discharge.  Patient denies having homicidal thoughts.  Patient denies having auditory hallucinations.  Patient denies any visual hallucinations.  Patient denies having paranoid thoughts. ? ?The patient is able to verbalize their individual safety plan to this provider. ? ?It is recommended to the patient to continue psychiatric medications as prescribed, after discharge from the hospital.   ? ?It is recommended to the patient to follow up with your outpatient psychiatric provider and PCP. ? ?Discussed with the patient, the impact of alcohol, drugs, tobacco have been there overall psychiatric and medical wellbeing, and total abstinence from substance use was recommended the patient. ? ? ? ? ? ?Musculoskeletal: ?Strength & Muscle Tone: within normal limits ?Gait & Station: normal ?Patient leans: N/A ? ?Psychiatric Specialty Exam ? ?Presentation  ?General Appearance: Casual ? ?Eye Contact:Good ? ?Speech:Clear and Coherent ? ?Speech Volume:Normal ? ?Handedness:Right ? ? ?Mood and Affect  ?Mood:Euthymic ? ?Duration of Depression Symptoms: Less than two weeks ? ?Affect:Restricted ? ? ?Thought Process  ?Thought Processes:Coherent ? ?Descriptions of Associations:Intact ? ?Orientation:Full (Time, Place and Person) ? ?Thought Content:Logical ? ?History of Schizophrenia/Schizoaffective disorder:No ? ?Duration of Psychotic Symptoms:No data recorded ?Hallucinations:Hallucinations: None ? ?Ideas of  Reference:None ? ?Suicidal Thoughts:Suicidal Thoughts: No ? ?Homicidal Thoughts:Homicidal Thoughts: No ? ? ?Sensorium  ?Memory:Immediate Good; Recent Good; Remote Good ? ?Judgment:Fair ? ?Insight:Fair ? ? ?Executive Functions  ?Concentration:Fair ? ?Attention  Span:Fair ? ?Recall:Fair ? ?Fund of Knowledge:Fair ? ?Language:Good ? ? ?Psychomotor Activity  ?Psychomotor Activity:Psychomotor Activity: Normal ? ? ?Assets  ?Assets:Communication Skills; Desire for Improvement; Physical Health; Social Support ? ? ?Sleep  ?Sleep:Sleep: Fair ? ? ?Physical Exam: ?Physical Exam See discharge summary ? ?ROS See discharge summary ? ?Blood pressure 108/68, pulse 94, temperature 98.2 ?F (36.8 ?C), temperature source Oral, resp. rate 20, height 6' (1.829 m), weight 70.3 kg, SpO2 99 %. Body mass index is 21.02 kg/m?. ? ?Mental Status Per Nursing Assessment::   ?On Admission:  Suicidal ideation indicated by patient, Self-harm thoughts ? ?Demographic factors:  Male, Low socioeconomic status, Caucasian ?Loss Factors:  Loss of significant relationship, Financial problems / change in socioeconomic status ?Historical Factors:  Impulsivity ?Risk Reduction Factors:  Living with another person, especially a relative ?  ? ?Continued Clinical Symptoms:  ?MDD - mood is stable. Denies SI. ?Alcohol use disorder - denies w/d symptoms and denies alcohol cravings  ? ?Cognitive Features That Contribute To Risk:  ?None   ? ?Suicide Risk:  ?Mild:  There are no identifiable suicide plans, no associated intent, mild dysphoria and related symptoms, good self-control (both objective and subjective assessment), few other risk factors, and identifiable protective factors, including available and accessible social support. ? ? Follow-up Information   ? ? [facility]. Go to.   ?Specialty: Behavioral Health ?Why: Please go to this provider for therapy and medication management services during walk in hours:  Mondays and Wednesdays,  arrive by 7:30 am.  Services are provided on a first come, first served basis. ?Contact information: ?[address] ?[place] [address] ?[phone] ? ?  ?  ? ?  ?  ? ?  ? ? ?Plan Of Care/Follow-up recommendations:  ? ?Activity: as tolerated ? ?Diet: heart healthy ? ?Other: ?-Follow-up with your outpatient psychiatric provider -instructions on appointment date, time, and address (location) are provided to you in discharge paperwork. ? ?-Take your psychiatric medications as prescribed at discharge - instructions are provided to you in the discharge paperwork ? ?-Follow-up with outpatient primary care doctor and other specialists -for management of chronic medical disease. ? ?-Testing: Follow-up with outpatient provider for abnormal lab results:  ?04-06-2021: ALT 56 ? ?-Recommend abstinence from alcohol, tobacco, and other illicit drug use at discharge.  ? ?-If your psychiatric symptoms recur, worsen, or if you have side effects to your psychiatric medications, call your outpatient psychiatric provider, 911, [phone] or go to the nearest emergency department. ? ?-If suicidal thoughts recur, call your outpatient psychiatric provider, 911, [phone] or go to the nearest emergency department. ? ? ?Cristy Hilts, MD ?04/08/2021, 10:11 AM ? ?

## 2021-04-08 NOTE — Progress Notes (Signed)
?   04/07/21 1945  ?Psych Admission Type (Psych Patients Only)  ?Admission Status Involuntary  ?Psychosocial Assessment  ?Patient Complaints None  ?Eye Contact Brief  ?Facial Expression Flat  ?Affect Blunted  ?Speech Logical/coherent  ?Interaction Minimal  ?Motor Activity Other (Comment) ?(WDL)  ?Appearance/Hygiene Unremarkable  ?Behavior Characteristics Cooperative  ?Mood Sad  ?Thought Process  ?Coherency WDL  ?Content WDL  ?Delusions None reported or observed  ?Perception WDL  ?Hallucination None reported or observed  ?Judgment WDL  ?Confusion None  ?Danger to Self  ?Current suicidal ideation? Denies  ?Danger to Others  ?Danger to Others None reported or observed  ? ? ?

## 2021-04-08 NOTE — Progress Notes (Signed)
Discharge note: RN met with pt and reviewed pt's discharge instructions. Pt verbalized understanding of discharge instructions and pt did not have any questions. RN reviewed and provided pt with a copy of SRA, AVS and Transition Record. RN returned pt's belongings to pt. Samples were given to pt. Pt denied SI/HI/AVH and voiced no concerns. Pt was appreciative of the care pt received at [facility]. Patient discharged to the lobby without incident.  ? 04/08/21 0745  ?Psych Admission Type (Psych Patients Only)  ?Admission Status Involuntary  ?Psychosocial Assessment  ?Patient Complaints Anxiety;Depression  ?Eye Contact Fair  ?Facial Expression Flat;Sad  ?Affect Sad;Blunted  ?Speech Logical/coherent;Soft  ?Interaction Minimal;Forwards little  ?Motor Activity Other (Comment) ?(WDL)  ?Appearance/Hygiene Unremarkable  ?Behavior Characteristics Cooperative;Appropriate to situation;Anxious  ?Mood Depressed;Sad  ?Thought Process  ?Coherency WDL  ?Content WDL  ?Delusions None reported or observed  ?Perception WDL  ?Hallucination None reported or observed  ?Judgment WDL  ?Confusion None  ?Danger to Self  ?Current suicidal ideation? Denies  ?Danger to Others  ?Danger to Others None reported or observed  ?Danger to Others Abnormal  ?Harmful Behavior to others No threats or harm toward other people  ?Destructive Behavior No threats or harm toward property  ? ? ?

## 2021-04-08 NOTE — Discharge Instructions (Signed)
?-  Follow-up with your outpatient psychiatric provider -instructions on appointment date, time, and address (location) are provided to you in discharge paperwork. ? ?-Take your psychiatric medications as prescribed at discharge - instructions are provided to you in the discharge paperwork ? ?-Follow-up with outpatient primary care doctor and other specialists -for management of chronic medical disease. ? ?-Testing: Follow-up with outpatient provider for abnormal lab results:  ?04-06-2021: ALT 56 ? ?-Recommend abstinence from alcohol, tobacco, and other illicit drug use at discharge.  ? ?-If your psychiatric symptoms recur, worsen, or if you have side effects to your psychiatric medications, call your outpatient psychiatric provider, 911, 988 or go to the nearest emergency department. ? ?-If suicidal thoughts recur, call your outpatient psychiatric provider, 911, 988 or go to the nearest emergency department. ? ?

## 2021-04-08 NOTE — Progress Notes (Signed)
?  [facility] Adult Case Management Discharge Plan : ? ?Will you be returning to the same living situation after discharge:  Yes,  Father's Home  ?At discharge, do you have transportation home?: Yes,  Grandmother  ?Do you have the ability to pay for your medications: Yes,  Family and Community Support  ? ?Release of information consent forms completed and in the chart;  Patient's signature needed at discharge. ? ?Patient to Follow up at: ? Follow-up Information   ? ? [facility] Go to.   ?Specialty: Behavioral Health ?Why: Please go to this provider for therapy and medication management services during walk in hours:  Mondays and Wednesdays, arrive by 7:30 am.  Services are provided on a first come, first served basis. ?Contact information: ?[address] ?[place] [address] ?[phone] ? ?  ?  ? ?  ?  ? ?  ? ? ?Next level of care provider has access to [facility] Link:yes ? ?Safety Planning and Suicide Prevention discussed: Yes,  with patient and mother  ? ?  ? ?Has patient been referred to the Quitline?: N/A patient is not a smoker ? ?Patient has been referred for addiction treatment: Pt. refused referral ? ?Aram Beecham, LCSWA ?04/08/2021, 9:27 AM ?

## 2021-04-15 ENCOUNTER — Other Ambulatory Visit (HOSPITAL_COMMUNITY): Payer: Self-pay

## 2021-04-22 ENCOUNTER — Telehealth (HOSPITAL_COMMUNITY): Payer: Self-pay | Admitting: *Deleted

## 2021-04-22 NOTE — Telephone Encounter (Signed)
Consulted with eddie PA re patient request to come in as a walk for his letter to be signed but eddie pa said he would need more than one visit with him to make such a decision. Reinforced with him he would need to make an appt and reestablish a relationship with a provider before such a decision could be made which would take more time than he has. Again, suggested he call the last facility he was at to see what if anything they would be willing to do.  ?

## 2021-04-22 NOTE — Telephone Encounter (Signed)
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 This clinic hasnt seen him since late Jan 22. His provider at that time is out on leave and the NP covering for her hasnt seen him. TOld him no one here could Clinical research associate such a letter or sign off on this concern without first being seen and that might not be as immediate as he might need. Suggested he call the inpatient unit as he has been seen there much more recently than this clinic. ?

## 2021-05-07 ENCOUNTER — Telehealth (HOSPITAL_COMMUNITY): Payer: No Payment, Other | Admitting: Psychiatry

## 2021-05-07 ENCOUNTER — Encounter (HOSPITAL_COMMUNITY): Payer: Self-pay

## 2021-06-19 ENCOUNTER — Encounter (HOSPITAL_COMMUNITY): Payer: Self-pay

## 2021-06-19 ENCOUNTER — Other Ambulatory Visit: Payer: Self-pay

## 2021-06-19 ENCOUNTER — Emergency Department (HOSPITAL_COMMUNITY)
Admission: EM | Admit: 2021-06-19 | Discharge: 2021-06-20 | Disposition: A | Discharge status: Other Acute Inpatient Hospital | Payer: Self-pay | Attending: Emergency Medicine | Admitting: Emergency Medicine

## 2021-06-19 DIAGNOSIS — Y908 Blood alcohol level of 240 mg/100 ml or more: Secondary | ICD-10-CM | POA: Insufficient documentation

## 2021-06-19 DIAGNOSIS — F1020 Alcohol dependence, uncomplicated: Secondary | ICD-10-CM | POA: Insufficient documentation

## 2021-06-19 DIAGNOSIS — Z20822 Contact with and (suspected) exposure to covid-19: Secondary | ICD-10-CM | POA: Insufficient documentation

## 2021-06-19 DIAGNOSIS — R45851 Suicidal ideations: Secondary | ICD-10-CM | POA: Insufficient documentation

## 2021-06-19 DIAGNOSIS — Z046 Encounter for general psychiatric examination, requested by authority: Secondary | ICD-10-CM | POA: Insufficient documentation

## 2021-06-19 LAB — URINALYSIS, ROUTINE W REFLEX MICROSCOPIC
Bacteria, UA: NONE SEEN
Bilirubin Urine: NEGATIVE
Glucose, UA: NEGATIVE mg/dL
Ketones, ur: NEGATIVE mg/dL
Leukocytes,Ua: NEGATIVE
Nitrite: NEGATIVE
Protein, ur: NEGATIVE mg/dL
Specific Gravity, Urine: 1.006 (ref 1.005–1.030)
pH: 5 (ref 5.0–8.0)

## 2021-06-19 LAB — COMPREHENSIVE METABOLIC PANEL
ALT: 59 U/L — ABNORMAL HIGH (ref 0–44)
AST: 80 U/L — ABNORMAL HIGH (ref 15–41)
Albumin: 4.1 g/dL (ref 3.5–5.0)
Alkaline Phosphatase: 121 U/L (ref 38–126)
Anion gap: 19 — ABNORMAL HIGH (ref 5–15)
BUN: <5 mg/dL — ABNORMAL LOW (ref 6–20)
CO2: 23 mmol/L (ref 22–32)
Calcium: 8.7 mg/dL — ABNORMAL LOW (ref 8.9–10.3)
Chloride: 99 mmol/L (ref 98–111)
Creatinine, Ser: 0.82 mg/dL (ref 0.61–1.24)
GFR, Estimated: >60 mL/min (ref >60)
Glucose, Bld: 114 mg/dL — ABNORMAL HIGH (ref 70–99)
Potassium: 3.4 mmol/L — ABNORMAL LOW (ref 3.5–5.1)
Sodium: 141 mmol/L (ref 135–145)
Total Bilirubin: 1 mg/dL (ref 0.3–1.2)
Total Protein: 7.7 g/dL (ref 6.5–8.1)

## 2021-06-19 LAB — CBC
HCT: 46.8 % (ref 39.0–52.0)
Hemoglobin: 16.9 g/dL (ref 13.0–17.0)
MCH: 34.1 pg — ABNORMAL HIGH (ref 26.0–34.0)
MCHC: 36.1 g/dL — ABNORMAL HIGH (ref 30.0–36.0)
MCV: 94.4 fL (ref 80.0–100.0)
Platelets: 298 10*3/uL (ref 150–400)
RBC: 4.96 MIL/uL (ref 4.22–5.81)
RDW: 13.2 % (ref 11.5–15.5)
WBC: 9.5 10*3/uL (ref 4.0–10.5)
nRBC: 0 % (ref 0.0–0.2)

## 2021-06-19 LAB — RAPID URINE DRUG SCREEN, HOSP PERFORMED
Amphetamines: NOT DETECTED
Barbiturates: NOT DETECTED
Benzodiazepines: NOT DETECTED
Cocaine: NOT DETECTED
Opiates: NOT DETECTED
Tetrahydrocannabinol: NOT DETECTED

## 2021-06-19 LAB — LIPASE, BLOOD: Lipase: 58 U/L — ABNORMAL HIGH (ref 11–51)

## 2021-06-19 LAB — ACETAMINOPHEN LEVEL: Acetaminophen (Tylenol), Serum: <10 ug/mL — ABNORMAL LOW (ref 10–30)

## 2021-06-19 LAB — SALICYLATE LEVEL: Salicylate Lvl: <7 mg/dL — ABNORMAL LOW (ref 7.0–30.0)

## 2021-06-19 LAB — ETHANOL: Alcohol, Ethyl (B): 455 mg/dL (ref <10)

## 2021-06-19 MED ORDER — LORAZEPAM 2 MG/ML IJ SOLN: 0.0000 mg | Freq: Four times a day (QID) | INTRAMUSCULAR | Status: DC

## 2021-06-19 MED ORDER — TRAZODONE HCL 50 MG PO TABS
50.0000 mg | ORAL_TABLET | Freq: Every evening | ORAL | Status: DC | PRN
  Administered 2021-06-19: 50 mg via ORAL
  Filled 2021-06-19: qty 1

## 2021-06-19 MED ORDER — GABAPENTIN 300 MG PO CAPS
300.0000 mg | ORAL_CAPSULE | Freq: Three times a day (TID) | ORAL | Status: DC
  Administered 2021-06-19 – 2021-06-20 (×2): 300 mg via ORAL
  Filled 2021-06-19 (×2): qty 1

## 2021-06-19 MED ORDER — LORAZEPAM 1 MG PO TABS
0.0000 mg | ORAL_TABLET | Freq: Four times a day (QID) | ORAL | Status: DC
  Administered 2021-06-19 – 2021-06-20 (×3): 1 mg via ORAL
  Filled 2021-06-19 (×3): qty 1

## 2021-06-19 MED ORDER — LORAZEPAM 1 MG PO TABS: 0.0000 mg | ORAL_TABLET | Freq: Two times a day (BID) | ORAL | Status: DC

## 2021-06-19 MED ORDER — THIAMINE HCL 100 MG PO TABS
100.0000 mg | ORAL_TABLET | Freq: Every day | ORAL | Status: DC
Start: 2021-06-19 — End: 2021-06-20
  Administered 2021-06-19 – 2021-06-20 (×2): 100 mg via ORAL
  Filled 2021-06-19 (×2): qty 1

## 2021-06-19 MED ORDER — LIDOCAINE VISCOUS HCL 2 % MT SOLN
15.0000 mL | Freq: Once | OROMUCOSAL | Status: AC
  Administered 2021-06-19: 15 mL via ORAL
  Filled 2021-06-19: qty 15

## 2021-06-19 MED ORDER — LORAZEPAM 2 MG/ML IJ SOLN: 0.0000 mg | Freq: Two times a day (BID) | INTRAMUSCULAR | Status: DC

## 2021-06-19 MED ORDER — THIAMINE HCL 100 MG/ML IJ SOLN
100.0000 mg | Freq: Every day | INTRAMUSCULAR | Status: DC
Start: 2021-06-19 — End: 2021-06-20
  Filled 2021-06-19: qty 2

## 2021-06-19 MED ORDER — ALUM & MAG HYDROXIDE-SIMETH 200-200-20 MG/5ML PO SUSP
30.0000 mL | Freq: Once | ORAL | Status: AC
  Administered 2021-06-19: 30 mL via ORAL
  Filled 2021-06-19: qty 30

## 2021-06-19 MED ORDER — SERTRALINE HCL 50 MG PO TABS
50.0000 mg | ORAL_TABLET | Freq: Every day | ORAL | Status: DC
  Administered 2021-06-19 – 2021-06-20 (×2): 50 mg via ORAL
  Filled 2021-06-19 (×2): qty 1

## 2021-06-19 NOTE — ED Triage Notes (Signed)
Pt arrived from home by sherriff. Pt's mom wants to IVC pt so she can get guardian of him, because pt has been having "mental health crisis" the last few years. The sherriff said the mother just said he bangs his head against the wall sometimes. The sherriff said the pt had a case of beer next to him. Pt arrived disheveled and soaked in urine, because pt urinated on himself. Pt is cooperative at this time.
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
## 2021-06-19 NOTE — ED Notes (Signed)
Sitter now present at bedside.

## 2021-06-19 NOTE — ED Notes (Signed)
Pt continuously telling this RN "this medicine isn't working, can you give me more medicine?" Pt educated multiple times about allowing time for medication to start working.

## 2021-06-19 NOTE — ED Notes (Signed)
Staffing office called about sitter, states there is not one available at this time.

## 2021-06-20 ENCOUNTER — Other Ambulatory Visit (HOSPITAL_COMMUNITY)
Admission: EM | Admit: 2021-06-20 | Discharge: 2021-06-23 | Disposition: A | Discharge status: Home / Self Care | Payer: No Payment, Other | Attending: Psychiatry | Admitting: Psychiatry

## 2021-06-20 DIAGNOSIS — Z79899 Other long term (current) drug therapy: Secondary | ICD-10-CM | POA: Insufficient documentation

## 2021-06-20 DIAGNOSIS — F1014 Alcohol abuse with alcohol-induced mood disorder: Secondary | ICD-10-CM | POA: Diagnosis not present

## 2021-06-20 DIAGNOSIS — F10230 Alcohol dependence with withdrawal, uncomplicated: Secondary | ICD-10-CM | POA: Diagnosis not present

## 2021-06-20 DIAGNOSIS — F10930 Alcohol use, unspecified with withdrawal, uncomplicated: Secondary | ICD-10-CM

## 2021-06-20 DIAGNOSIS — F1994 Other psychoactive substance use, unspecified with psychoactive substance-induced mood disorder: Secondary | ICD-10-CM | POA: Diagnosis present

## 2021-06-20 DIAGNOSIS — F10229 Alcohol dependence with intoxication, unspecified: Secondary | ICD-10-CM | POA: Insufficient documentation

## 2021-06-20 DIAGNOSIS — F411 Generalized anxiety disorder: Secondary | ICD-10-CM | POA: Insufficient documentation

## 2021-06-20 DIAGNOSIS — R251 Tremor, unspecified: Secondary | ICD-10-CM | POA: Insufficient documentation

## 2021-06-20 DIAGNOSIS — F332 Major depressive disorder, recurrent severe without psychotic features: Secondary | ICD-10-CM | POA: Diagnosis not present

## 2021-06-20 DIAGNOSIS — R45851 Suicidal ideations: Secondary | ICD-10-CM | POA: Insufficient documentation

## 2021-06-20 LAB — RESP PANEL BY RT-PCR (FLU A&B, COVID) ARPGX2
Influenza A by PCR: NEGATIVE
Influenza B by PCR: NEGATIVE
SARS Coronavirus 2 by RT PCR: NEGATIVE

## 2021-06-20 MED ORDER — HYDROXYZINE HCL 25 MG PO TABS
25.0000 mg | ORAL_TABLET | Freq: Four times a day (QID) | ORAL | Status: AC | PRN
  Administered 2021-06-20: 25 mg via ORAL
  Filled 2021-06-20: qty 1

## 2021-06-20 MED ORDER — LOPERAMIDE HCL 2 MG PO CAPS
2.0000 mg | ORAL_CAPSULE | ORAL | Status: AC | PRN
  Administered 2021-06-21: 4 mg via ORAL
  Administered 2021-06-22: 2 mg via ORAL
  Filled 2021-06-20: qty 1
  Filled 2021-06-20: qty 2

## 2021-06-20 MED ORDER — POTASSIUM CHLORIDE CRYS ER 20 MEQ PO TBCR
20.0000 meq | EXTENDED_RELEASE_TABLET | Freq: Two times a day (BID) | ORAL | Status: AC
  Administered 2021-06-20 (×2): 20 meq via ORAL
  Filled 2021-06-20 (×2): qty 1

## 2021-06-20 MED ORDER — LORAZEPAM 2 MG/ML IJ SOLN: 0.0000 mg | Freq: Two times a day (BID) | INTRAMUSCULAR | Status: DC

## 2021-06-20 MED ORDER — LORAZEPAM 1 MG PO TABS
1.0000 mg | ORAL_TABLET | Freq: Every day | ORAL | Status: AC
  Administered 2021-06-23: 1 mg via ORAL
  Filled 2021-06-20: qty 1

## 2021-06-20 MED ORDER — ONDANSETRON 4 MG PO TBDP: 4.0000 mg | ORAL_TABLET | Freq: Four times a day (QID) | ORAL | Status: AC | PRN

## 2021-06-20 MED ORDER — ALUM & MAG HYDROXIDE-SIMETH 200-200-20 MG/5ML PO SUSP: 30.0000 mL | ORAL | Status: DC | PRN

## 2021-06-20 MED ORDER — GABAPENTIN 300 MG PO CAPS
300.0000 mg | ORAL_CAPSULE | Freq: Three times a day (TID) | ORAL | Status: DC
  Administered 2021-06-20 – 2021-06-23 (×9): 300 mg via ORAL
  Filled 2021-06-20 (×2): qty 21
  Filled 2021-06-20 (×7): qty 1
  Filled 2021-06-20: qty 21
  Filled 2021-06-20 (×2): qty 1

## 2021-06-20 MED ORDER — THIAMINE HCL 100 MG PO TABS
100.0000 mg | ORAL_TABLET | Freq: Every day | ORAL | Status: DC
  Administered 2021-06-21 – 2021-06-23 (×3): 100 mg via ORAL
  Filled 2021-06-20: qty 1
  Filled 2021-06-20: qty 7
  Filled 2021-06-20 (×2): qty 1

## 2021-06-20 MED ORDER — SERTRALINE HCL 50 MG PO TABS
50.0000 mg | ORAL_TABLET | Freq: Every day | ORAL | Status: DC
  Administered 2021-06-21 – 2021-06-23 (×3): 50 mg via ORAL
  Filled 2021-06-20 (×2): qty 1
  Filled 2021-06-20: qty 7
  Filled 2021-06-20 (×2): qty 1

## 2021-06-20 MED ORDER — TRAZODONE HCL 100 MG PO TABS
100.0000 mg | ORAL_TABLET | Freq: Every evening | ORAL | Status: DC | PRN
  Administered 2021-06-20 – 2021-06-21 (×2): 100 mg via ORAL
  Filled 2021-06-20 (×2): qty 1

## 2021-06-20 MED ORDER — THIAMINE HCL 100 MG/ML IJ SOLN
100.0000 mg | Freq: Once | INTRAMUSCULAR | Status: AC
Start: 2021-06-20 — End: 2021-06-20
  Administered 2021-06-20: 100 mg via INTRAMUSCULAR
  Filled 2021-06-20: qty 2

## 2021-06-20 MED ORDER — LORAZEPAM 1 MG PO TABS
1.0000 mg | ORAL_TABLET | Freq: Three times a day (TID) | ORAL | Status: AC
  Administered 2021-06-21 (×3): 1 mg via ORAL
  Filled 2021-06-20 (×3): qty 1

## 2021-06-20 MED ORDER — LORAZEPAM 1 MG PO TABS
0.0000 mg | ORAL_TABLET | Freq: Four times a day (QID) | ORAL | Status: DC
  Administered 2021-06-20: 2 mg via ORAL
  Filled 2021-06-20: qty 2

## 2021-06-20 MED ORDER — LORAZEPAM 1 MG PO TABS
2.0000 mg | ORAL_TABLET | ORAL | Status: AC
Start: 2021-06-20 — End: 2021-06-20
  Administered 2021-06-20: 2 mg via ORAL
  Filled 2021-06-20: qty 2

## 2021-06-20 MED ORDER — LORAZEPAM 1 MG PO TABS
1.0000 mg | ORAL_TABLET | Freq: Four times a day (QID) | ORAL | Status: AC | PRN
  Administered 2021-06-20: 1 mg via ORAL

## 2021-06-20 MED ORDER — ADULT MULTIVITAMIN W/MINERALS CH
1.0000 | ORAL_TABLET | Freq: Every day | ORAL | Status: DC
Start: 2021-06-20 — End: 2021-06-23
  Administered 2021-06-20 – 2021-06-23 (×4): 1 via ORAL
  Filled 2021-06-20 (×2): qty 1
  Filled 2021-06-20: qty 7
  Filled 2021-06-20 (×2): qty 1

## 2021-06-20 MED ORDER — LORAZEPAM 2 MG/ML IJ SOLN: 0.0000 mg | Freq: Four times a day (QID) | INTRAMUSCULAR | Status: DC

## 2021-06-20 MED ORDER — MAGNESIUM HYDROXIDE 400 MG/5ML PO SUSP: 30.0000 mL | Freq: Every day | ORAL | Status: DC | PRN

## 2021-06-20 MED ORDER — BOOST / RESOURCE BREEZE PO LIQD CUSTOM
1.0000 | Freq: Three times a day (TID) | ORAL | Status: DC
  Administered 2021-06-20 – 2021-06-21 (×2): 1 via ORAL

## 2021-06-20 MED ORDER — LORAZEPAM 1 MG PO TABS: 0.0000 mg | ORAL_TABLET | Freq: Two times a day (BID) | ORAL | Status: DC

## 2021-06-20 MED ORDER — LORAZEPAM 1 MG PO TABS
1.0000 mg | ORAL_TABLET | Freq: Two times a day (BID) | ORAL | Status: AC
  Administered 2021-06-22 (×2): 1 mg via ORAL
  Filled 2021-06-20 (×2): qty 1

## 2021-06-20 MED ORDER — HYDROXYZINE HCL 25 MG PO TABS: 25.0000 mg | ORAL_TABLET | Freq: Three times a day (TID) | ORAL | Status: DC | PRN

## 2021-06-20 MED ORDER — ACETAMINOPHEN 325 MG PO TABS: 650.0000 mg | ORAL_TABLET | Freq: Four times a day (QID) | ORAL | Status: DC | PRN

## 2021-06-20 MED ORDER — LORAZEPAM 1 MG PO TABS
1.0000 mg | ORAL_TABLET | Freq: Four times a day (QID) | ORAL | Status: AC
  Administered 2021-06-20 (×2): 1 mg via ORAL
  Filled 2021-06-20 (×3): qty 1

## 2021-06-20 NOTE — ED Provider Notes (Signed)
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 (2021)    Anion gap 04/02/2021 15  5 - 15 Final   Performed at [facility], [address], [place], [place] [address]   Alcohol, Ethyl  (B) 04/02/2021 374 (HH)  <10 mg/dL Final   Comment: CRITICAL RESULT CALLED TO, READ BACK BY AND VERIFIED WITH:  FELICIA GREEN RN 04/02/21 @ 2322 VS (NOTE) Lowest detectable limit for serum alcohol is 10 mg/dL.  For medical purposes only. Performed at [facility], [address] [place], [place] [address]    WBC 04/02/2021 6.9  4.0 - 10.5 K/uL Final  RBC 04/02/2021 5.33  4.22 - 5.81 MIL/uL Final   Hemoglobin 04/02/2021 17.5 (H)  13.0 - 17.0 g/dL Final   HCT 03/70/4888 49.0  39.0 - 52.0 % Final   MCV 04/02/2021 91.9  80.0 - 100.0 fL Final   MCH 04/02/2021 32.8  26.0 - 34.0 pg Final   MCHC 04/02/2021 35.7  30.0 - 36.0 g/dL Final   RDW 91/69/4503 13.3  11.5 - 15.5 % Final   Platelets 04/02/2021 338  150 - 400 K/uL Final   nRBC 04/02/2021 0.0  0.0 - 0.2 % Final   Neutrophils Relative % 04/02/2021 60  % Final   Neutro Abs 04/02/2021 4.0  1.7 - 7.7 K/uL Final   Lymphocytes Relative 04/02/2021 29  % Final   Lymphs Abs 04/02/2021 2.0  0.7 - 4.0 K/uL Final   Monocytes Relative 04/02/2021 9  % Final   Monocytes Absolute 04/02/2021 0.6  0.1 - 1.0 K/uL Final   Eosinophils Relative 04/02/2021 1  % Final   Eosinophils Absolute 04/02/2021 0.1  0.0 - 0.5 K/uL Final   Basophils Relative 04/02/2021 1  % Final   Basophils Absolute 04/02/2021 0.1  0.0 - 0.1 K/uL Final   Immature Granulocytes 04/02/2021 0  % Final   Abs Immature Granulocytes 04/02/2021 0.03  0.00 - 0.07 K/uL Final   Performed at [facility], [address], [place], [place] [address]   Opiates 04/03/2021 NONE DETECTED  NONE DETECTED Final   Cocaine 04/03/2021 NONE DETECTED  NONE DETECTED Final   Benzodiazepines 04/03/2021 NONE DETECTED  NONE DETECTED Final   Amphetamines 04/03/2021 NONE DETECTED  NONE DETECTED Final   Tetrahydrocannabinol 04/03/2021 POSITIVE (A)  NONE DETECTED Final   Barbiturates 04/03/2021 NONE DETECTED  NONE DETECTED Final   Comment: (NOTE) DRUG SCREEN FOR MEDICAL  PURPOSES ONLY.  IF CONFIRMATION IS NEEDED FOR ANY PURPOSE, NOTIFY LAB WITHIN 5 DAYS.  LOWEST DETECTABLE LIMITS FOR URINE DRUG SCREEN Drug Class                     Cutoff (ng/mL) Amphetamine and metabolites    1000 Barbiturate and metabolites    200 Benzodiazepine                 200 Tricyclics and metabolites     300 Opiates and metabolites        300 Cocaine and metabolites        300 THC                            50 Performed at [facility], [address], [place], [place] [address]    SARS Coronavirus 2 by RT PCR 04/03/2021 NEGATIVE  NEGATIVE Final   Comment: (NOTE) SARS-CoV-2 target nucleic acids are NOT DETECTED.  The SARS-CoV-2 RNA is generally detectable in upper respiratory specimens during the acute phase of infection. The lowest concentration of SARS-CoV-2 viral copies this assay can detect is 138 copies/mL. A negative result does not preclude SARS-Cov-2 infection and should not be used as the sole basis for treatment or other patient management decisions. A negative result may occur with  improper specimen collection/handling, submission of specimen other than nasopharyngeal swab, presence of viral mutation(s) within the areas targeted by this assay, and inadequate number of viral copies(<138 copies/mL). A negative result must be combined with clinical observations, patient history, and epidemiological information. The expected result is Negative.  Fact Sheet for Patients:  BloggerCourse.com  Fact Sheet for Healthcare Providers:  SeriousBroker.it  This test is no                          t yet approved or cleared by the [place] FDA and  has been authorized for detection and/or diagnosis of SARS-CoV-2 by FDA under an Emergency Use Authorization (EUA). This EUA will remain  in effect (meaning this test can be used) for the duration of the COVID-19 declaration under Section 564(b)(1) of  the Act, 21 U.S.C.section 360bbb-3(b)(1), unless the authorization is terminated  or revoked sooner.       Influenza A by PCR 04/03/2021 NEGATIVE  NEGATIVE Final   Influenza B by PCR 04/03/2021 NEGATIVE  NEGATIVE Final   Comment: (NOTE) The Xpert Xpress SARS-CoV-2/FLU/RSV plus assay is intended as an aid in the diagnosis of influenza from Nasopharyngeal swab specimens and should not be used as a sole basis for treatment. Nasal washings and aspirates are unacceptable for Xpert Xpress SARS-CoV-2/FLU/RSV testing.  Fact Sheet for Patients: BloggerCourse.com  Fact Sheet for Healthcare Providers: SeriousBroker.it  This test is not yet approved or cleared by the [place] FDA and has been authorized for detection and/or diagnosis of SARS-CoV-2 by FDA under an Emergency Use Authorization (EUA). This EUA will remain in effect (meaning this test can be used) for the duration of the COVID-19 declaration under Section 564(b)(1) of the Act, 21 U.S.C. section 360bbb-3(b)(1), unless the authorization is terminated or revoked.  Performed at [facility], [address], [place], [place] [address]   Admission on 01/31/2021, Discharged on 02/03/2021  Component Date Value Ref Range Status   SARS Coronavirus 2 by RT PCR 01/31/2021 POSITIVE (A)  NEGATIVE Final   Comment: (NOTE) SARS-CoV-2 target nucleic acids are DETECTED.  The SARS-CoV-2 RNA is generally detectable in upper respiratory specimens during the acute phase of infection. Positive results are indicative of the presence of the identified virus, but do not rule out bacterial infection or co-infection with other pathogens not detected by the test. Clinical correlation with patient history and other diagnostic information is necessary to determine patient infection status. The expected result is Negative.  Fact Sheet for  Patients: BloggerCourse.com  Fact Sheet for Healthcare Providers: SeriousBroker.it  This test is not yet approved or cleared by the [place] FDA and  has been authorized for detection and/or diagnosis of SARS-CoV-2 by FDA under an Emergency Use Authorization (EUA).  This EUA will remain in effect (meaning this test can be used) for the duration of  the COVID-19 declaration under Section 564(b)(1) of the A                          ct, 21 U.S.C. section 360bbb-3(b)(1), unless the authorization is terminated or revoked sooner.     Influenza A by PCR 01/31/2021 NEGATIVE  NEGATIVE Final   Influenza B by PCR 01/31/2021 NEGATIVE  NEGATIVE Final   Comment: (NOTE) The Xpert Xpress SARS-CoV-2/FLU/RSV plus assay is intended as an aid in the diagnosis of influenza from Nasopharyngeal swab specimens and should not be used as a sole basis for treatment. Nasal washings and aspirates are unacceptable for Xpert Xpress SARS-CoV-2/FLU/RSV testing.  Fact Sheet for Patients: BloggerCourse.com  Fact Sheet for Healthcare Providers: SeriousBroker.it  This test is not yet approved or cleared by the [place] FDA and has been authorized for detection and/or diagnosis of SARS-CoV-2 by FDA  under an Emergency Use Authorization (EUA). This EUA will remain in effect (meaning this test can be used) for the duration of the COVID-19 declaration under Section 564(b)(1) of the Act, 21 U.S.C. section 360bbb-3(b)(1), unless the authorization is terminated or revoked.  Performed at [facility], [address], [place], [place] [address]    Sodium 01/31/2021 134 (L)  135 - 145 mmol/L Final   Potassium 01/31/2021 3.5  3.5 - 5.1 mmol/L Final   Chloride 01/31/2021 94 (L)  98 - 111 mmol/L Final   CO2 01/31/2021 22  22 - 32 mmol/L Final   Glucose, Bld 01/31/2021 138 (H)  70 - 99 mg/dL Final   Glucose  reference range applies only to samples taken after fasting for at least 8 hours.   BUN 01/31/2021 8  6 - 20 mg/dL Final   Creatinine, Ser 01/31/2021 0.81  0.61 - 1.24 mg/dL Final   Calcium 52/84/1324 9.3  8.9 - 10.3 mg/dL Final   Total Protein 40/10/2723 8.1  6.5 - 8.1 g/dL Final   Albumin 36/64/4034 4.5  3.5 - 5.0 g/dL Final   AST 74/25/9563 300 (H)  15 - 41 U/L Final   ALT 01/31/2021 318 (H)  0 - 44 U/L Final   Alkaline Phosphatase 01/31/2021 97  38 - 126 U/L Final   Total Bilirubin 01/31/2021 1.0  0.3 - 1.2 mg/dL Final   GFR, Estimated 01/31/2021 >60  >60 mL/min Final   Comment: (NOTE) Calculated using the CKD-EPI Creatinine Equation (2021)    Anion gap 01/31/2021 18 (H)  5 - 15 Final   Performed at [facility], [address], [place], [place] [address]   Alcohol, Ethyl (B) 01/31/2021 197 (H)  <10 mg/dL Final   Comment: (NOTE) Lowest detectable limit for serum alcohol is 10 mg/dL.  For medical purposes only. Performed at [facility], [address] [place], [place] [address]    WBC 01/31/2021 11.8 (H)  4.0 - 10.5 K/uL Final   RBC 01/31/2021 5.23  4.22 - 5.81 MIL/uL Final   Hemoglobin 01/31/2021 16.8  13.0 - 17.0 g/dL Final   HCT 18/84/1660 46.5  39.0 - 52.0 % Final   MCV 01/31/2021 88.9  80.0 - 100.0 fL Final   MCH 01/31/2021 32.1  26.0 - 34.0 pg Final   MCHC 01/31/2021 36.1 (H)  30.0 - 36.0 g/dL Final   RDW 63/01/6008 12.7  11.5 - 15.5 % Final   Platelets 01/31/2021 296  150 - 400 K/uL Final   nRBC 01/31/2021 0.0  0.0 - 0.2 % Final   Neutrophils Relative % 01/31/2021 80  % Final   Neutro Abs 01/31/2021 9.3 (H)  1.7 - 7.7 K/uL Final   Lymphocytes Relative 01/31/2021 12  % Final   Lymphs Abs 01/31/2021 1.4  0.7 - 4.0 K/uL Final   Monocytes Relative 01/31/2021 8  % Final   Monocytes Absolute 01/31/2021 0.9  0.1 - 1.0 K/uL Final   Eosinophils Relative 01/31/2021 0  % Final   Eosinophils Absolute 01/31/2021 0.0  0.0 - 0.5 K/uL Final   Basophils Relative  01/31/2021 0  % Final   Basophils Absolute 01/31/2021 0.1  0.0 - 0.1 K/uL Final   Immature Granulocytes 01/31/2021 0  % Final   Abs Immature Granulocytes 01/31/2021 0.04  0.00 - 0.07 K/uL Final   Performed at [facility], [address], [place], [place] [address]   Salicylate Lvl 01/31/2021 <7.0 (L)  7.0 - 30.0  mg/dL Final   Performed at [facility], [address], [place], [place] [address]   Acetaminophen (Tylenol), Serum 01/31/2021 <10 (L)  10 - 30 ug/mL Final   Comment: (NOTE) Therapeutic concentrations vary significantly. A range of 10-30 ug/mL  may be an effective concentration for many patients. However, some  are best treated at concentrations outside of this range. Acetaminophen concentrations >150 ug/mL at 4 hours after ingestion  and >50 ug/mL at 12 hours after ingestion are often associated with  toxic reactions.  Performed at [facility], [address] [place], [place] [address]    Opiates 01/31/2021 NONE DETECTED  NONE DETECTED Final   Cocaine 01/31/2021 NONE DETECTED  NONE DETECTED Final   Benzodiazepines 01/31/2021 NONE DETECTED  NONE DETECTED Final   Amphetamines 01/31/2021 NONE DETECTED  NONE DETECTED Final   Tetrahydrocannabinol 01/31/2021 POSITIVE (A)  NONE DETECTED Final   Barbiturates 01/31/2021 NONE DETECTED  NONE DETECTED Final   Comment: (NOTE) DRUG SCREEN FOR MEDICAL PURPOSES ONLY.  IF CONFIRMATION IS NEEDED FOR ANY PURPOSE, NOTIFY LAB WITHIN 5 DAYS.  LOWEST DETECTABLE LIMITS FOR URINE DRUG SCREEN Drug Class                     Cutoff (ng/mL) Amphetamine and metabolites    1000 Barbiturate and metabolites    200 Benzodiazepine                 200 Tricyclics and metabolites     300 Opiates and metabolites        300 Cocaine and metabolites        300 THC                            50 Performed at [facility], [address], [place], [place] [address]   Admission on 01/31/2021, Discharged on 01/31/2021  Component  Date Value Ref Range Status   SARS Coronavirus 2 by RT PCR 01/31/2021 POSITIVE (A)  NEGATIVE Final   Comment: (NOTE) SARS-CoV-2 target nucleic acids are DETECTED.  The SARS-CoV-2 RNA is generally detectable in upper respiratory specimens during the acute phase of infection. Positive results are indicative of the presence of the identified virus, but do not rule out bacterial infection or co-infection with other pathogens not detected by the test. Clinical correlation with patient history and other diagnostic information is necessary to determine patient infection status. The expected result is Negative.  Fact Sheet for Patients: BloggerCourse.com  Fact Sheet for Healthcare Providers: SeriousBroker.it  This test is not yet approved or cleared by the [place] FDA and  has been authorized for detection and/or diagnosis of SARS-CoV-2 by FDA under an Emergency Use Authorization (EUA).  This EUA will remain in effect (meaning this test can be used) for the duration of  the COVID-19 declaration under Section 564(b)(1) of the A                          ct, 21 U.S.C. section 360bbb-3(b)(1), unless the authorization is terminated or revoked sooner.     Influenza A by PCR 01/31/2021 NEGATIVE  NEGATIVE Final   Influenza B by PCR 01/31/2021 NEGATIVE  NEGATIVE Final   Comment: (NOTE) The Xpert Xpress SARS-CoV-2/FLU/RSV plus assay is intended as an aid in the diagnosis of influenza from Nasopharyngeal swab specimens and should not be used as a sole basis for treatment.  Nasal washings and aspirates are unacceptable for Xpert Xpress SARS-CoV-2/FLU/RSV testing.  Fact Sheet for Patients: BloggerCourse.com  Fact Sheet for Healthcare Providers: SeriousBroker.it  This test is not yet approved or cleared by the [place] FDA and has been authorized for detection and/or diagnosis of  SARS-CoV-2 by FDA under an Emergency Use Authorization (EUA). This EUA will remain in effect (meaning this test can be used) for the duration of the COVID-19 declaration under Section 564(b)(1) of the Act, 21 U.S.C. section 360bbb-3(b)(1), unless the authorization is terminated or revoked.  Performed at [facility], [address], [place], [place] [address]    WBC 01/31/2021 9.1  4.0 - 10.5 K/uL Final   RBC 01/31/2021 5.44  4.22 - 5.81 MIL/uL Final   Hemoglobin 01/31/2021 17.1 (H)  13.0 - 17.0 g/dL Final   HCT 56/21/3086 48.5  39.0 - 52.0 % Final   MCV 01/31/2021 89.2  80.0 - 100.0 fL Final   MCH 01/31/2021 31.4  26.0 - 34.0 pg Final   MCHC 01/31/2021 35.3  30.0 - 36.0 g/dL Final   RDW 57/84/6962 12.8  11.5 - 15.5 % Final   Platelets 01/31/2021 305  150 - 400 K/uL Final   nRBC 01/31/2021 0.0  0.0 - 0.2 % Final   Neutrophils Relative % 01/31/2021 77  % Final   Neutro Abs 01/31/2021 7.0  1.7 - 7.7 K/uL Final   Lymphocytes Relative 01/31/2021 14  % Final   Lymphs Abs 01/31/2021 1.2  0.7 - 4.0 K/uL Final   Monocytes Relative 01/31/2021 8  % Final   Monocytes Absolute 01/31/2021 0.8  0.1 - 1.0 K/uL Final   Eosinophils Relative 01/31/2021 0  % Final   Eosinophils Absolute 01/31/2021 0.0  0.0 - 0.5 K/uL Final   Basophils Relative 01/31/2021 1  % Final   Basophils Absolute 01/31/2021 0.1  0.0 - 0.1 K/uL Final   Immature Granulocytes 01/31/2021 0  % Final   Abs Immature Granulocytes 01/31/2021 0.03  0.00 - 0.07 K/uL Final   Performed at [facility], [address], [place], [place] [address]   Sodium 01/31/2021 135  135 - 145 mmol/L Final   Potassium 01/31/2021 3.9  3.5 - 5.1 mmol/L Final   Chloride 01/31/2021 96 (L)  98 - 111 mmol/L Final   CO2 01/31/2021 25  22 - 32 mmol/L Final   Glucose, Bld 01/31/2021 113 (H)  70 - 99 mg/dL Final   Glucose reference range applies only to samples taken after fasting for at least 8 hours.   BUN 01/31/2021 5 (L)  6 - 20 mg/dL Final    Creatinine, Ser 01/31/2021 0.71  0.61 - 1.24 mg/dL Final   Calcium 13/24/4010 9.4  8.9 - 10.3 mg/dL Final   Total Protein 27/25/3664 8.0  6.5 - 8.1 g/dL Final   Albumin 40/34/7425 4.5  3.5 - 5.0 g/dL Final   AST 95/63/8756 424 (H)  15 - 41 U/L Final   ALT 01/31/2021 342 (H)  0 - 44 U/L Final   Alkaline Phosphatase 01/31/2021 92  38 - 126 U/L Final   Total Bilirubin 01/31/2021 0.8  0.3 - 1.2 mg/dL Final   GFR, Estimated 01/31/2021 >60  >60 mL/min Final   Comment: (NOTE) Calculated using the CKD-EPI Creatinine Equation (2021)    Anion gap 01/31/2021 14  5 - 15 Final   Performed at [facility], [address], [place], [place] [address]   Hgb A1c MFr Bld 01/31/2021 5.2  4.8 - 5.6 % Final   Comment: (NOTE) Pre diabetes:          5.7%-6.4%  Diabetes:              >6.4%  Glycemic control for   <7.0% adults with diabetes    Mean Plasma Glucose 01/31/2021 102.54  mg/dL Final   Performed at [facility], [address], [place], [place] [address]   Magnesium 01/31/2021 2.3  1.7 - 2.4 mg/dL Final   Performed at [facility], [address], [place], [place] [address]   Alcohol, Ethyl (B) 01/31/2021 254 (H)  <10 mg/dL Final   Comment: (NOTE) Lowest detectable limit for serum alcohol is 10 mg/dL.  For medical purposes only. Performed at [facility], [address], [place], [place] [address]    Cholesterol 01/31/2021 208 (H)  0 - 200 mg/dL Final   Triglycerides 91/47/8295 80  <150 mg/dL Final   HDL 62/13/0865 91  >40 mg/dL Final   Total CHOL/HDL Ratio 01/31/2021 2.3  RATIO Final   VLDL 01/31/2021 16  0 - 40 mg/dL Final   LDL Cholesterol 01/31/2021 101 (H)  0 - 99 mg/dL Final   Comment:        Total Cholesterol/HDL:CHD Risk Coronary Heart Disease Risk Table                     Men   Women  1/2 Average Risk   3.4   3.3  Average Risk       5.0   4.4  2 X Average Risk   9.6   7.1  3 X Average Risk  23.4   11.0        Use the calculated Patient Ratio above  and the CHD Risk Table to determine the patient's CHD Risk.        ATP III CLASSIFICATION (LDL):  <100     mg/dL   Optimal  784-696  mg/dL   Near or Above                    Optimal  130-159  mg/dL   Borderline  295-284  mg/dL   High  >132     mg/dL   Very High Performed at [facility], [address], [place], [place] [address]    TSH 01/31/2021 0.569  0.350 - 4.500 uIU/mL Final   Comment: Performed by a 3rd Generation assay with a functional sensitivity of <=0.01 uIU/mL. Performed at [facility], [address], [place], [place] [address]    Prolactin 01/31/2021 12.0  4.0 - 15.2 ng/mL Final   Comment: (NOTE) Performed At: [facility] [address] [place], [place] [id number] Jolene Schimke MD QV:[id number]    POC Amphetamine UR 01/31/2021 None Detected  NONE DETECTED (Cut Off Level 1000 ng/mL) Final   POC Secobarbital (BAR) 01/31/2021 None Detected  NONE DETECTED (Cut Off Level 300 ng/mL) Final   POC Buprenorphine (BUP) 01/31/2021 None Detected  NONE DETECTED (Cut Off Level 10 ng/mL) Final   POC Oxazepam (BZO) 01/31/2021 None Detected  NONE DETECTED (Cut Off Level 300 ng/mL) Final   POC Cocaine UR 01/31/2021 None Detected  NONE DETECTED (Cut Off Level 300 ng/mL) Final   POC Methamphetamine UR 01/31/2021 None Detected  NONE DETECTED (Cut Off Level 1000 ng/mL) Final   POC Morphine 01/31/2021 None Detected  NONE DETECTED (Cut Off Level 300 ng/mL) Final   POC  Oxycodone UR 01/31/2021 None Detected  NONE DETECTED (Cut Off Level 100 ng/mL) Final   POC Methadone UR 01/31/2021 None Detected  NONE DETECTED (Cut Off Level 300 ng/mL) Final   POC Marijuana UR 01/31/2021 Positive (A)  NONE DETECTED (Cut Off Level 50 ng/mL) Final   SARS Coronavirus 2 Ag 01/31/2021 Negative  Negative Final  Admission on 01/20/2021, Discharged on 01/21/2021  Component Date Value Ref Range Status   Sodium 01/20/2021 137  135 - 145 mmol/L Final   Potassium 01/20/2021 3.8  3.5 - 5.1 mmol/L  Final   Chloride 01/20/2021 96 (L)  98 - 111 mmol/L Final   CO2 01/20/2021 26  22 - 32 mmol/L Final   Glucose, Bld 01/20/2021 105 (H)  70 - 99 mg/dL Final   Glucose reference range applies only to samples taken after fasting for at least 8 hours.   BUN 01/20/2021 7  6 - 20 mg/dL Final   Creatinine, Ser 01/20/2021 0.80  0.61 - 1.24 mg/dL Final   Calcium 16/10/9602 9.0  8.9 - 10.3 mg/dL Final   Total Protein 54/09/8117 8.8 (H)  6.5 - 8.1 g/dL Final   Albumin 14/78/2956 4.8  3.5 - 5.0 g/dL Final   AST 21/30/8657 41  15 - 41 U/L Final   ALT 01/20/2021 39  0 - 44 U/L Final   Alkaline Phosphatase 01/20/2021 90  38 - 126 U/L Final   Total Bilirubin 01/20/2021 1.2  0.3 - 1.2 mg/dL Final   GFR, Estimated 01/20/2021 >60  >60 mL/min Final   Comment: (NOTE) Calculated using the CKD-EPI Creatinine Equation (2021)    Anion gap 01/20/2021 15  5 - 15 Final   Performed at [facility], [address], [place], [place] [address]   Alcohol, Ethyl (B) 01/20/2021 231 (H)  <10 mg/dL Final   Comment: (NOTE) Lowest detectable limit for serum alcohol is 10 mg/dL.  For medical purposes only. Performed at [facility], [address], [place], [place] [address]    Salicylate Lvl 01/20/2021 <7.0 (L)  7.0 - 30.0 mg/dL Final   Performed at [facility], [address], [place], [place] [address]   Acetaminophen (Tylenol), Serum 01/20/2021 <10 (L)  10 - 30 ug/mL Final   Comment: (NOTE) Therapeutic concentrations vary significantly. A range of 10-30 ug/mL  may be an effective concentration for many patients. However, some  are best treated at concentrations outside of this range. Acetaminophen concentrations >150 ug/mL at 4 hours after ingestion  and >50 ug/mL at 12 hours after ingestion are often associated with  toxic reactions.  Performed at [facility], [address], [place], [place] [address]    WBC 01/20/2021 11.3 (H)  4.0  - 10.5 K/uL Final   RBC 01/20/2021 5.57  4.22 - 5.81 MIL/uL Final   Hemoglobin 01/20/2021 17.7 (H)  13.0 - 17.0 g/dL Final   HCT 72/53/6644 49.6  39.0 - 52.0 % Final   MCV 01/20/2021 89.0  80.0 - 100.0 fL Final   MCH 01/20/2021 31.8  26.0 - 34.0 pg Final   MCHC 01/20/2021 35.7  30.0 - 36.0 g/dL Final   RDW 03/47/4259 12.6  11.5 - 15.5 % Final   Platelets 01/20/2021 400  150 - 400 K/uL Final   nRBC 01/20/2021 0.0  0.0 - 0.2 % Final   Performed at [facility], [address], [place], [place] [address]   Opiates 01/20/2021 NONE DETECTED  NONE DETECTED Final  Cocaine 01/20/2021 NONE DETECTED  NONE DETECTED Final   Benzodiazepines 01/20/2021 NONE DETECTED  NONE DETECTED Final   Amphetamines 01/20/2021 NONE DETECTED  NONE DETECTED Final   Tetrahydrocannabinol 01/20/2021 POSITIVE (A)  NONE DETECTED Final   Barbiturates 01/20/2021 NONE DETECTED  NONE DETECTED Final   Comment: (NOTE) DRUG SCREEN FOR MEDICAL PURPOSES ONLY.  IF CONFIRMATION IS NEEDED FOR ANY PURPOSE, NOTIFY LAB WITHIN 5 DAYS.  LOWEST DETECTABLE LIMITS FOR URINE DRUG SCREEN Drug Class                     Cutoff (ng/mL) Amphetamine and metabolites    1000 Barbiturate and metabolites    200 Benzodiazepine                 200 Tricyclics and metabolites     300 Opiates and metabolites        300 Cocaine and metabolites        300 THC                            50 Performed at [facility], [address] [place], [place] [address]    SARS Coronavirus 2 by RT PCR 01/21/2021 NEGATIVE  NEGATIVE Final   Comment: (NOTE) SARS-CoV-2 target nucleic acids are NOT DETECTED.  The SARS-CoV-2 RNA is generally detectable in upper respiratory specimens during the acute phase of infection. The lowest concentration of SARS-CoV-2 viral copies this assay can detect is 138 copies/mL. A negative result does not preclude SARS-Cov-2 infection and should not be used as the sole basis for treatment  or other patient management decisions. A negative result may occur with  improper specimen collection/handling, submission of specimen other than nasopharyngeal swab, presence of viral mutation(s) within the areas targeted by this assay, and inadequate number of viral copies(<138 copies/mL). A negative result must be combined with clinical observations, patient history, and epidemiological information. The expected result is Negative.  Fact Sheet for Patients:  BloggerCourse.com  Fact Sheet for Healthcare Providers:  SeriousBroker.it  This test is no                          t yet approved or cleared by the [place] FDA and  has been authorized for detection and/or diagnosis of SARS-CoV-2 by FDA under an Emergency Use Authorization (EUA). This EUA will remain  in effect (meaning this test can be used) for the duration of the COVID-19 declaration under Section 564(b)(1) of the Act, 21 U.S.C.section 360bbb-3(b)(1), unless the authorization is terminated  or revoked sooner.       Influenza A by PCR 01/21/2021 NEGATIVE  NEGATIVE Final   Influenza B by PCR 01/21/2021 NEGATIVE  NEGATIVE Final   Comment: (NOTE) The Xpert Xpress SARS-CoV-2/FLU/RSV plus assay is intended as an aid in the diagnosis of influenza from Nasopharyngeal swab specimens and should not be used as a sole basis for treatment. Nasal washings and aspirates are unacceptable for Xpert Xpress SARS-CoV-2/FLU/RSV testing.  Fact Sheet for Patients: BloggerCourse.com  Fact Sheet for Healthcare Providers: SeriousBroker.it  This test is not yet approved or cleared by the [place] FDA and has been authorized for detection and/or diagnosis of SARS-CoV-2 by FDA under an Emergency Use Authorization (EUA). This EUA will remain in effect (meaning this test can be used) for the duration of the COVID-19 declaration under  Section 564(b)(1) of the Act, 21 U.S.C. section  360bbb-3(b)(1),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

## 2021-06-20 NOTE — ED Notes (Signed)
Patient resting quietly on bed in hallway with eyes closed.  Respirations even and unlabored

## 2021-06-20 NOTE — ED Notes (Signed)
Pt wanded by security. 

## 2021-06-20 NOTE — ED Notes (Signed)
Patient admitted via [employer] office due to IVC status.  Patient has been drinking heavily daily.  He was malodorous with clothing covered in feces urine and alcohol.  Clothing washed immediately upon patient arrival. Patient calm and cooperative with slow movements and poor eye contact.  Patient is tremulous with tongue vesiculation.  He is mildly confused however follows simple direction.  Patient started on an ativan taper and first dose given.  Patient directed to the shower and he completed ADL's promptly.  He was given something to eat and is now sitting in day room watching TV.  He presently denies avh shi or plan and will seek out staff if overwhelmed.  Will monitor closely and address all withdrawal symptoms.

## 2021-06-20 NOTE — ED Notes (Signed)
Pt awake laying in his bed calm and cooperative. No c/o pain or distress. Will continue to monitor for safety

## 2021-06-20 NOTE — BH Assessment (Signed)
Thomas Ghee, NP, reviewed pt's chart and information and determined pt meets criteria for [facility]. Prior to admission to [facility], pt is required to have more vitals WNL to ensure he is medically stable to be transferred. Pt to be reviewed for [facility] by AM staff. This information was relayed to pt's team at

## 2021-06-20 NOTE — ED Notes (Signed)
Pt placed in room for TTS assessment, sitter at bedside.

## 2021-06-20 NOTE — ED Notes (Signed)
Pt in shower.  

## 2021-06-20 NOTE — ED Notes (Signed)
Paperwork and belongings given to [employer]

## 2021-06-20 NOTE — ED Notes (Signed)
Sitter at bedside.

## 2021-06-20 NOTE — ED Notes (Signed)
Pt reported to nurse that he is feeling anxious and requested for medication for anxiety.

## 2021-06-20 NOTE — ED Notes (Signed)
Pt returned to hallway bed with sitter at bedside.

## 2021-06-20 NOTE — ED Notes (Signed)
RN updated mom, [name]

## 2021-06-20 NOTE — Progress Notes (Signed)
COWS= 14 1mg  ativan given PO as per taper.

## 2021-06-20 NOTE — ED Provider Notes (Signed)
Emergency Medicine Observation Re-evaluation Note  Thomas [name] is a 31 y.o. male, seen on rounds today.  Pt initially presented to the ED for complaints of Mental Health Problem, Addiction Problem, and IVC Currently, the patient is calm, comfortable.  He is complaining that he is getting little shaky.  Physical Exam  BP 118/74   Pulse (!) 103   Temp 98.5 F (36.9 C) (Oral)   Resp 16   Ht 6' (1.829 m)   Wt 70.3 kg   SpO2 95%   BMI 21.02 kg/m  Physical Exam General: No acute distress Cardiac: Regular rate Lungs: No respiratory distress Psych: Slightly anxious appearing  ED Course / MDM  EKG:EKG Interpretation  Date/Time:  Thursday June 19 2021 17:45:20 EDT Ventricular Rate:  102 PR Interval:  116 QRS Duration: 74 QT Interval:  334 QTC Calculation: 435 R Axis:   71 Text Interpretation: Sinus tachycardia Otherwise normal ECG When compared with ECG of 02-Apr-2021 23:11, PREVIOUS ECG IS PRESENT since last tracing no significant change Confirmed by Rolan Bucco [phone]) on 06/19/2021 10:40:21 PM  I have reviewed the labs performed to date as well as medications administered while in observation.  Recent changes in the last 24 hours include no acute changes.  Plan  Current plan is for to initiate the CIWA protocol. Nursing staff made aware. Thomas [name] is under involuntary commitment.      Derwood Kaplan, MD 06/20/21 [phone]

## 2021-06-20 NOTE — ED Notes (Signed)
MD Nanavati notified on pts CIWA

## 2021-06-20 NOTE — ED Notes (Signed)
Pt has made his 2 phone calls

## 2021-06-20 NOTE — ED Notes (Signed)
Pt belongings placed in locker 11 ?

## 2021-06-20 NOTE — ED Notes (Signed)
Pt in dayroom, pt urinated on himself. He was taken in his room and changed. Pt calm, cooperative. Will continue to monitor pt for safety
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
## 2021-06-21 DIAGNOSIS — F10230 Alcohol dependence with withdrawal, uncomplicated: Secondary | ICD-10-CM | POA: Diagnosis not present

## 2021-06-21 DIAGNOSIS — F332 Major depressive disorder, recurrent severe without psychotic features: Secondary | ICD-10-CM | POA: Diagnosis not present

## 2021-06-21 DIAGNOSIS — F1014 Alcohol abuse with alcohol-induced mood disorder: Secondary | ICD-10-CM | POA: Diagnosis not present

## 2021-06-21 DIAGNOSIS — F10229 Alcohol dependence with intoxication, unspecified: Secondary | ICD-10-CM | POA: Diagnosis not present

## 2021-06-21 MED ORDER — QUETIAPINE FUMARATE 50 MG PO TABS
50.0000 mg | ORAL_TABLET | Freq: Every day | ORAL | Status: DC
Start: 2021-06-21 — End: 2021-06-23
  Administered 2021-06-21 – 2021-06-22 (×2): 50 mg via ORAL
  Filled 2021-06-21 (×2): qty 1
  Filled 2021-06-21: qty 7

## 2021-06-21 NOTE — ED Notes (Signed)
Pt sitting in dayroom watching tv. Denies concerns or needs at present. Informed pt to notify staff with any change in health. Denies SI. Will continue to monitor for safety.

## 2021-06-21 NOTE — ED Notes (Signed)
Patient A&Ox4. Denies intent to harm self/others when asked. Denies A/VH. Patient denies any physical complaints when asked. No acute distress noted. Pt states, "I ate last night. I feel better today". Praise given. Routine safety checks conducted according to facility protocol. Encouraged patient to notify staff if thoughts of harm toward self or others arise. Patient verbalize understanding and agreement. Will continue to monitor for safety.

## 2021-06-21 NOTE — ED Provider Notes (Signed)
Behavioral Health Progress Note  Date and Time: 06/21/2021 11:49 AM Name: Thomas [name] MRN:  [id number]  Subjective:  Thomas [name] 30 y.o., male who initially presented to [facility] ED under IVC on 06/29/2021.  He was transferred to the [facility] on 06/20/2021 for continued detox and crisis stabilization.  Thomas [name], 31 y.o., male patient seen face to face by this provider, consulted with Dr. Bronwen Betters; and chart reviewed on 06/21/21.  Patient has a past psychiatric history of bipolar and panic disorder. Upon admission patient's EtOH is 455 and UDS negative.  During evaluation Thomas [name] is laying in his bed asleep.  He is easily awakened.  He is alert/oriented x4 and cooperative.  He is disheveled and makes fair eye contact.  His speech is clear, coherent, normal rate and tone.  He is easily distracted and has fair concentration. He endorses depression and anxiety and has a depressed affect.  Reports he is not sleeping well and has not eaten due to a poor appetite.  He denies SI/HI/AVH.  He does not appear to be responding to internal/external stimuli.He endorses alcohol withdrawal symptoms that include diarrhea.  He has a fine tremor in his hands.  He is tolerating the Ativan taper without any adverse reactions.  He had a CIWA score of 6 this a.m. but since that time scores have remained at 1 or below.   Reports he is unsure if he would like to attend a residential substance abuse treatment program.  States his mother has been looking into [facility] in [place].  Explained that referral could be made from this facility if he is interested.  Reports he will think it over.  Patient reports he would like to be restarted on Seroquel, he felt it was helpful in controlling his mood and it helped him sleep.  Seroquel 50 mg nightly will be initiated.  Naltrexone was discussed but will not be restarted at this time due to continued GI symptoms.      Diagnosis:  Final diagnoses:  None     Total Time spent with patient: 30 minutes  Past Psychiatric History: See H&P Past Medical History:  Past Medical History:  Diagnosis Date   Anxiety     Past Surgical History:  Procedure Laterality Date   APPENDECTOMY     Family History: No family history on file. Family Psychiatric  History: See H&P Social History:  Social History   Substance and Sexual Activity  Alcohol Use Yes   Alcohol/week: 140.0 standard drinks of alcohol   Types: 140 Cans of beer per week   Comment: reported 20+ beers/day     Social History   Substance and Sexual Activity  Drug Use No    Social History   Socioeconomic History   Marital status: Single    Spouse name: Not on file   Number of children: Not on file   Years of education: Not on file   Highest education level: Not on file  Occupational History   Not on file  Tobacco Use   Smoking status: Never   Smokeless tobacco: Never  Vaping Use   Vaping Use: Never used  Substance and Sexual Activity   Alcohol use: Yes    Alcohol/week: 140.0 standard drinks of alcohol    Types: 140 Cans of beer per week    Comment: reported 20+ beers/day   Drug use: No   Sexual activity: Yes  Other Topics Concern   Not on file  Social History Narrative  Not on file   Social Determinants of Health   Financial Resource Strain: Not on file  Food Insecurity: Not on file  Transportation Needs: Not on file  Physical Activity: Not on file  Stress: Not on file  Social Connections: Not on file   SDOH:  SDOH Screenings   Alcohol Screen: Medium Risk (04/09/2021)   Alcohol Screen    Last Alcohol Screening Score (AUDIT): 36  Depression (PHQ2-9): Medium Risk (06/20/2021)   Depression (PHQ2-9)    PHQ-2 Score: 12  Financial Resource Strain: Not on file  Food Insecurity: Not on file  Housing: Not on file  Physical Activity: Not on file  Social Connections: Not on file  Stress: Not on file  Tobacco Use: Low Risk  (06/19/2021)   Patient History     Smoking Tobacco Use: Never    Smokeless Tobacco Use: Never    Passive Exposure: Not on file  Transportation Needs: Not on file   Additional Social History:                         Sleep: Fair  Appetite:  Fair  Current Medications:  Current Facility-Administered Medications  Medication Dose Route Frequency Provider Last Rate Last Admin   acetaminophen (TYLENOL) tablet 650 mg  650 mg Oral Q6H PRN Estella Husk, MD       alum & mag hydroxide-simeth (MAALOX/MYLANTA) 200-200-20 MG/5ML suspension 30 mL  30 mL Oral Q4H PRN Estella Husk, MD       feeding supplement (BOOST / RESOURCE BREEZE) liquid 1 Container  1 Container Oral TID BM Ajibola, Ene A, NP   1 Container at 06/20/21 2018   gabapentin (NEURONTIN) capsule 300 mg  300 mg Oral TID Estella Husk, MD   300 mg at 06/21/21 0934   [START ON 06/23/2021] hydrOXYzine (ATARAX) tablet 25 mg  25 mg Oral TID PRN Estella Husk, MD       hydrOXYzine (ATARAX) tablet 25 mg  25 mg Oral Q6H PRN Estella Husk, MD   25 mg at 06/20/21 1951   loperamide (IMODIUM) capsule 2-4 mg  2-4 mg Oral PRN Estella Husk, MD       LORazepam (ATIVAN) tablet 1 mg  1 mg Oral Q6H PRN Estella Husk, MD   1 mg at 06/20/21 1250   LORazepam (ATIVAN) tablet 1 mg  1 mg Oral TID Estella Husk, MD   1 mg at 06/21/21 4098   Followed by   Melene Muller ON 06/22/2021] LORazepam (ATIVAN) tablet 1 mg  1 mg Oral BID Estella Husk, MD       Followed by   Melene Muller ON 06/23/2021] LORazepam (ATIVAN) tablet 1 mg  1 mg Oral Daily Estella Husk, MD       magnesium hydroxide (MILK OF MAGNESIA) suspension 30 mL  30 mL Oral Daily PRN Estella Husk, MD       multivitamin with minerals tablet 1 tablet  1 tablet Oral Daily Estella Husk, MD   1 tablet at 06/21/21 0934   ondansetron (ZOFRAN-ODT) disintegrating tablet 4 mg  4 mg Oral Q6H PRN Estella Husk, MD       sertraline (ZOLOFT) tablet 50 mg  50 mg Oral  Daily Estella Husk, MD   50 mg at 06/21/21 0934   thiamine tablet 100 mg  100 mg Oral Daily Estella Husk, MD   100 mg at 06/21/21 [phone]  traZODone (DESYREL) tablet 100 mg  100 mg Oral QHS PRN Estella Husk, MD   100 mg at 06/20/21 2200   Current Outpatient Medications  Medication Sig Dispense Refill   gabapentin (NEURONTIN) 300 MG capsule Take 1 capsule (300 mg total) by mouth 3 (three) times daily. (Patient not taking: Reported on 06/19/2021) 90 capsule 0   sertraline (ZOLOFT) 50 MG tablet Take 1 tablet (50 mg total) by mouth daily. (Patient not taking: Reported on 06/19/2021) 30 tablet 0   traZODone (DESYREL) 50 MG tablet Take 1 tablet (50 mg total) by mouth at bedtime as needed for sleep. (Patient not taking: Reported on 06/19/2021) 30 tablet 0    Labs  Lab Results:  Admission on 06/19/2021, Discharged on 06/20/2021  Component Date Value Ref Range Status   Sodium 06/19/2021 141  135 - 145 mmol/L Final   Potassium 06/19/2021 3.4 (L)  3.5 - 5.1 mmol/L Final   Chloride 06/19/2021 99  98 - 111 mmol/L Final   CO2 06/19/2021 23  22 - 32 mmol/L Final   Glucose, Bld 06/19/2021 114 (H)  70 - 99 mg/dL Final   Glucose reference range applies only to samples taken after fasting for at least 8 hours.   BUN 06/19/2021 <5 (L)  6 - 20 mg/dL Final   Creatinine, Ser 06/19/2021 0.82  0.61 - 1.24 mg/dL Final   Calcium 16/10/9602 8.7 (L)  8.9 - 10.3 mg/dL Final   Total Protein 54/09/8117 7.7  6.5 - 8.1 g/dL Final   Albumin 14/78/2956 4.1  3.5 - 5.0 g/dL Final   AST 21/30/8657 80 (H)  15 - 41 U/L Final   ALT 06/19/2021 59 (H)  0 - 44 U/L Final   Alkaline Phosphatase 06/19/2021 121  38 - 126 U/L Final   Total Bilirubin 06/19/2021 1.0  0.3 - 1.2 mg/dL Final   GFR, Estimated 06/19/2021 >60  >60 mL/min Final   Comment: (NOTE) Calculated using the CKD-EPI Creatinine Equation (2021)    Anion gap 06/19/2021 19 (H)  5 - 15 Final   Performed at [facility], [address]  [place], [place] [address]   Alcohol, Ethyl (B) 06/19/2021 455 (HH)  <10 mg/dL Final   Comment: CRITICAL RESULT CALLED TO, READ BACK BY AND VERIFIED WITH: A.BANKS,RN  06/19/2021 VANG.J (NOTE) Lowest detectable limit for serum alcohol is 10 mg/dL.  For medical purposes only. Performed at [facility] [place] [facility], [address] [place], [place] [address]    Salicylate Lvl 06/19/2021 <7.0 (L)  7.0 - 30.0 mg/dL Final   Performed at [facility], [address] [place], [place] [address]   Acetaminophen (Tylenol), Serum 06/19/2021 <10 (L)  10 - 30 ug/mL Final   Comment: (NOTE) Therapeutic concentrations vary significantly. A range of 10-30 ug/mL  may be an effective concentration for many patients. However, some  are best treated at concentrations outside of this range. Acetaminophen concentrations >150 ug/mL at 4 hours after ingestion  and >50 ug/mL at 12 hours after ingestion are often associated with  toxic reactions.  Performed at [facility], [address] [place], [place] [address]    WBC 06/19/2021 9.5  4.0 - 10.5 K/uL Final   RBC 06/19/2021 4.96  4.22 - 5.81 MIL/uL Final   Hemoglobin 06/19/2021 16.9  13.0 - 17.0 g/dL Final   HCT 72/53/6644 46.8  39.0 - 52.0 % Final   MCV 06/19/2021 94.4  80.0 - 100.0 fL Final   MCH  06/19/2021 34.1 (H)  26.0 - 34.0 pg Final   MCHC 06/19/2021 36.1 (H)  30.0 - 36.0 g/dL Final   RDW 16/10/9602 13.2  11.5 - 15.5 % Final   Platelets 06/19/2021 298  150 - 400 K/uL Final   nRBC 06/19/2021 0.0  0.0 - 0.2 % Final   Performed at [facility], [address] [place], [place] [address]   Opiates 06/19/2021 NONE DETECTED  NONE DETECTED Final   Cocaine 06/19/2021 NONE DETECTED  NONE DETECTED Final   Benzodiazepines 06/19/2021 NONE DETECTED  NONE DETECTED Final   Amphetamines 06/19/2021 NONE DETECTED  NONE DETECTED Final   Tetrahydrocannabinol 06/19/2021 NONE DETECTED  NONE DETECTED Final   Barbiturates 06/19/2021 NONE DETECTED  NONE  DETECTED Final   Comment: (NOTE) DRUG SCREEN FOR MEDICAL PURPOSES ONLY.  IF CONFIRMATION IS NEEDED FOR ANY PURPOSE, NOTIFY LAB WITHIN 5 DAYS.  LOWEST DETECTABLE LIMITS FOR URINE DRUG SCREEN Drug Class                     Cutoff (ng/mL) Amphetamine and metabolites    1000 Barbiturate and metabolites    200 Benzodiazepine                 200 Tricyclics and metabolites     300 Opiates and metabolites        300 Cocaine and metabolites        300 THC                            50 Performed at [facility], [address], [place], [place] [address]    SARS Coronavirus 2 by RT PCR 06/19/2021 NEGATIVE  NEGATIVE Final   Comment: (NOTE) SARS-CoV-2 target nucleic acids are NOT DETECTED.  The SARS-CoV-2 RNA is generally detectable in upper respiratory specimens during the acute phase of infection. The lowest concentration of SARS-CoV-2 viral copies this assay can detect is 138 copies/mL. A negative result does not preclude SARS-Cov-2 infection and should not be used as the sole basis for treatment or other patient management decisions. A negative result may occur with  improper specimen collection/handling, submission of specimen other than nasopharyngeal swab, presence of viral mutation(s) within the areas targeted by this assay, and inadequate number of viral copies(<138 copies/mL). A negative result must be combined with clinical observations, patient history, and epidemiological information. The expected result is Negative.  Fact Sheet for Patients:  BloggerCourse.com  Fact Sheet for Healthcare Providers:  SeriousBroker.it  This test is no                          t yet approved or cleared by the [place] FDA and  has been authorized for detection and/or diagnosis of SARS-CoV-2 by FDA under an Emergency Use Authorization (EUA). This EUA will remain  in effect (meaning this test can be used) for the duration of  the COVID-19 declaration under Section 564(b)(1) of the Act, 21 U.S.C.section 360bbb-3(b)(1), unless the authorization is terminated  or revoked sooner.       Influenza A by PCR 06/19/2021 NEGATIVE  NEGATIVE Final   Influenza B by PCR 06/19/2021 NEGATIVE  NEGATIVE Final   Comment: (NOTE) The Xpert Xpress SARS-CoV-2/FLU/RSV plus assay is intended as an aid in the diagnosis of influenza from Nasopharyngeal swab specimens and should not be used as a sole basis for treatment. Nasal washings and  aspirates are unacceptable for Xpert Xpress SARS-CoV-2/FLU/RSV testing.  Fact Sheet for Patients: BloggerCourse.com  Fact Sheet for Healthcare Providers: SeriousBroker.it  This test is not yet approved or cleared by the [place] FDA and has been authorized for detection and/or diagnosis of SARS-CoV-2 by FDA under an Emergency Use Authorization (EUA). This EUA will remain in effect (meaning this test can be used) for the duration of the COVID-19 declaration under Section 564(b)(1) of the Act, 21 U.S.C. section 360bbb-3(b)(1), unless the authorization is terminated or revoked.  Performed at [facility], [address], [place], [place] [address]    Color, Urine 06/19/2021 YELLOW  YELLOW Final   APPearance 06/19/2021 CLEAR  CLEAR Final   Specific Gravity, Urine 06/19/2021 1.006  1.005 - 1.030 Final   pH 06/19/2021 5.0  5.0 - 8.0 Final   Glucose, UA 06/19/2021 NEGATIVE  NEGATIVE mg/dL Final   Hgb urine dipstick 06/19/2021 MODERATE (A)  NEGATIVE Final   Bilirubin Urine 06/19/2021 NEGATIVE  NEGATIVE Final   Ketones, ur 06/19/2021 NEGATIVE  NEGATIVE mg/dL Final   Protein, ur 60/45/4098 NEGATIVE  NEGATIVE mg/dL Final   Nitrite 11/91/4782 NEGATIVE  NEGATIVE Final   Leukocytes,Ua 06/19/2021 NEGATIVE  NEGATIVE Final   RBC / HPF 06/19/2021 0-5  0 - 5 RBC/hpf Final   WBC, UA 06/19/2021 0-5  0 - 5 WBC/hpf Final   Bacteria, UA 06/19/2021  NONE SEEN  NONE SEEN Final   Squamous Epithelial / LPF 06/19/2021 0-5  0 - 5 Final   Mucus 06/19/2021 PRESENT   Final   Hyaline Casts, UA 06/19/2021 PRESENT   Final   Performed at [facility], [address] [place], [place] [address]   Lipase 06/19/2021 58 (H)  11 - 51 U/L Final   Performed at [facility], [address], [place], [place] [address]  Admission on 04/03/2021, Discharged on 04/08/2021  Component Date Value Ref Range Status   Color, Urine 04/03/2021 AMBER (A)  YELLOW Final   BIOCHEMICALS MAY BE AFFECTED BY COLOR   APPearance 04/03/2021 CLEAR  CLEAR Final   Specific Gravity, Urine 04/03/2021 1.021  1.005 - 1.030 Final   pH 04/03/2021 6.0  5.0 - 8.0 Final   Glucose, UA 04/03/2021 NEGATIVE  NEGATIVE mg/dL Final   Hgb urine dipstick 04/03/2021 NEGATIVE  NEGATIVE Final   Bilirubin Urine 04/03/2021 NEGATIVE  NEGATIVE Final   Ketones, ur 04/03/2021 NEGATIVE  NEGATIVE mg/dL Final   Protein, ur 78/46/9629 NEGATIVE  NEGATIVE mg/dL Final   Nitrite 52/84/1324 NEGATIVE  NEGATIVE Final   Leukocytes,Ua 04/03/2021 NEGATIVE  NEGATIVE Final   RBC / HPF 04/03/2021 0-5  0 - 5 RBC/hpf Final   WBC, UA 04/03/2021 0-5  0 - 5 WBC/hpf Final   Bacteria, UA 04/03/2021 NONE SEEN  NONE SEEN Final   Mucus 04/03/2021 PRESENT   Final   Performed at [facility], [address] [place], [place] [address]   WBC 04/04/2021 7.9  4.0 - 10.5 K/uL Final   RBC 04/04/2021 4.61  4.22 - 5.81 MIL/uL Final   Hemoglobin 04/04/2021 15.2  13.0 - 17.0 g/dL Final   HCT 72/53/6644 42.9  39.0 - 52.0 % Final   MCV 04/04/2021 93.1  80.0 - 100.0 fL Final   MCH 04/04/2021 33.0  26.0 - 34.0 pg Final   MCHC 04/04/2021 35.4  30.0 - 36.0 g/dL Final   RDW 03/47/4259 13.2  11.5 - 15.5 % Final   Platelets 04/04/2021 338  150 -  400 K/uL Final   nRBC 04/04/2021 0.0  0.0 - 0.2 % Final   Performed at [facility], [address] [place], [place] [address]   Sodium 04/04/2021 132  (L)  135 - 145 mmol/L Final   Potassium 04/04/2021 3.2 (L)  3.5 - 5.1 mmol/L Final   Chloride 04/04/2021 92 (L)  98 - 111 mmol/L Final   CO2 04/04/2021 29  22 - 32 mmol/L Final   Glucose, Bld 04/04/2021 109 (H)  70 - 99 mg/dL Final   Glucose reference range applies only to samples taken after fasting for at least 8 hours.   BUN 04/04/2021 13  6 - 20 mg/dL Final   Creatinine, Ser 04/04/2021 0.75  0.61 - 1.24 mg/dL Final   Calcium 60/45/4098 9.5  8.9 - 10.3 mg/dL Final   Total Protein 11/91/4782 7.5  6.5 - 8.1 g/dL Final   Albumin 95/62/1308 4.2  3.5 - 5.0 g/dL Final   AST 65/78/4696 38  15 - 41 U/L Final   ALT 04/04/2021 56 (H)  0 - 44 U/L Final   Alkaline Phosphatase 04/04/2021 74  38 - 126 U/L Final   Total Bilirubin 04/04/2021 1.2  0.3 - 1.2 mg/dL Final   GFR, Estimated 04/04/2021 >60  >60 mL/min Final   Comment: (NOTE) Calculated using the CKD-EPI Creatinine Equation (2021)    Anion gap 04/04/2021 11  5 - 15 Final   Performed at [facility], [address], [place], [place] [address]   Hgb A1c MFr Bld 04/04/2021 4.9  4.8 - 5.6 % Final   Comment: (NOTE) Pre diabetes:          5.7%-6.4%  Diabetes:              >6.4%  Glycemic control for   <7.0% adults with diabetes    Mean Plasma Glucose 04/04/2021 93.93  mg/dL Final   Performed at [facility], [address] [place], [place] [address]   Total Protein 04/04/2021 7.6  6.5 - 8.1 g/dL Final   Albumin 40/10/2723 4.2  3.5 - 5.0 g/dL Final   AST 36/64/4034 38  15 - 41 U/L Final   ALT 04/04/2021 55 (H)  0 - 44 U/L Final   Alkaline Phosphatase 04/04/2021 72  38 - 126 U/L Final   Total Bilirubin 04/04/2021 1.1  0.3 - 1.2 mg/dL Final   Bilirubin, Direct 04/04/2021 0.2  0.0 - 0.2 mg/dL Final   Indirect Bilirubin 04/04/2021 0.9  0.3 - 0.9 mg/dL Final   Performed at [facility], [address] [place], [place] [address]   TSH 04/04/2021 1.533  0.350 - 4.500 uIU/mL Final   Comment:  Performed by a 3rd Generation assay with a functional sensitivity of <=0.01 uIU/mL. Performed at [facility], [address], [place], [place] [address]    Sodium 04/06/2021 137  135 - 145 mmol/L Final   Potassium 04/06/2021 3.7  3.5 - 5.1 mmol/L Final   Chloride 04/06/2021 101  98 - 111 mmol/L Final   CO2 04/06/2021 27  22 - 32 mmol/L Final   Glucose, Bld 04/06/2021 107 (H)  70 - 99 mg/dL Final   Glucose reference range applies only to samples taken after fasting for at least 8 hours.   BUN 04/06/2021 8  6 - 20 mg/dL Final   Creatinine, Ser 04/06/2021 0.87  0.61 - 1.24 mg/dL Final   Calcium 56/43/3295 9.6  8.9 - 10.3 mg/dL Final  Total Protein 04/06/2021 7.8  6.5 - 8.1 g/dL Final   Albumin 11/91/4782 4.2  3.5 - 5.0 g/dL Final   AST 95/62/1308 38  15 - 41 U/L Final   ALT 04/06/2021 56 (H)  0 - 44 U/L Final   Alkaline Phosphatase 04/06/2021 72  38 - 126 U/L Final   Total Bilirubin 04/06/2021 0.9  0.3 - 1.2 mg/dL Final   GFR, Estimated 04/06/2021 >60  >60 mL/min Final   Comment: (NOTE) Calculated using the CKD-EPI Creatinine Equation (2021)    Anion gap 04/06/2021 9  5 - 15 Final   Performed at [facility], [address] [place], [place] [address]  Admission on 04/02/2021, Discharged on 04/03/2021  Component Date Value Ref Range Status   Acetaminophen (Tylenol), Serum 04/02/2021 <10 (L)  10 - 30 ug/mL Final   Comment: (NOTE) Therapeutic concentrations vary significantly. A range of 10-30 ug/mL  may be an effective concentration for many patients. However, some  are best treated at concentrations outside of this range. Acetaminophen concentrations >150 ug/mL at 4 hours after ingestion  and >50 ug/mL at 12 hours after ingestion are often associated with  toxic reactions.  Performed at [facility], [address], [place], [place] [address]    Sodium 04/02/2021 137  135 - 145 mmol/L Final   Potassium 04/02/2021 3.5  3.5  - 5.1 mmol/L Final   Chloride 04/02/2021 97 (L)  98 - 111 mmol/L Final   CO2 04/02/2021 25  22 - 32 mmol/L Final   Glucose, Bld 04/02/2021 114 (H)  70 - 99 mg/dL Final   Glucose reference range applies only to samples taken after fasting for at least 8 hours.   BUN 04/02/2021 6  6 - 20 mg/dL Final   Creatinine, Ser 04/02/2021 0.65  0.61 - 1.24 mg/dL Final   Calcium 52/84/1324 9.0  8.9 - 10.3 mg/dL Final   Total Protein 40/10/2723 9.0 (H)  6.5 - 8.1 g/dL Final   Albumin 36/64/4034 4.9  3.5 - 5.0 g/dL Final   AST 74/25/9563 47 (H)  15 - 41 U/L Final   ALT 04/02/2021 71 (H)  0 - 44 U/L Final   Alkaline Phosphatase 04/02/2021 89  38 - 126 U/L Final   Total Bilirubin 04/02/2021 0.3  0.3 - 1.2 mg/dL Final   GFR, Estimated 04/02/2021 >60  >60 mL/min Final   Comment: (NOTE) Calculated using the CKD-EPI Creatinine Equation (2021)    Anion gap 04/02/2021 15  5 - 15 Final   Performed at [facility], [address], [place], [place] [address]   Alcohol, Ethyl (B) 04/02/2021 374 (HH)  <10 mg/dL Final   Comment: CRITICAL RESULT CALLED TO, READ BACK BY AND VERIFIED WITH:  FELICIA GREEN RN 04/02/21 @ 2322 VS (NOTE) Lowest detectable limit for serum alcohol is 10 mg/dL.  For medical purposes only. Performed at [facility], [address] [place], [place] [address]    WBC 04/02/2021 6.9  4.0 - 10.5 K/uL Final   RBC 04/02/2021 5.33  4.22 - 5.81 MIL/uL Final   Hemoglobin 04/02/2021 17.5 (H)  13.0 - 17.0 g/dL Final   HCT 18/84/1660 49.0  39.0 - 52.0 % Final   MCV 04/02/2021 91.9  80.0 - 100.0 fL Final   MCH 04/02/2021 32.8  26.0 - 34.0 pg Final   MCHC 04/02/2021 35.7  30.0 - 36.0 g/dL Final   RDW 63/01/6008 13.3  11.5 - 15.5 % Final  Platelets 04/02/2021 338  150 - 400 K/uL Final   nRBC 04/02/2021 0.0  0.0 - 0.2 % Final   Neutrophils Relative % 04/02/2021 60  % Final   Neutro Abs 04/02/2021 4.0  1.7 - 7.7 K/uL Final   Lymphocytes Relative 04/02/2021 29  %  Final   Lymphs Abs 04/02/2021 2.0  0.7 - 4.0 K/uL Final   Monocytes Relative 04/02/2021 9  % Final   Monocytes Absolute 04/02/2021 0.6  0.1 - 1.0 K/uL Final   Eosinophils Relative 04/02/2021 1  % Final   Eosinophils Absolute 04/02/2021 0.1  0.0 - 0.5 K/uL Final   Basophils Relative 04/02/2021 1  % Final   Basophils Absolute 04/02/2021 0.1  0.0 - 0.1 K/uL Final   Immature Granulocytes 04/02/2021 0  % Final   Abs Immature Granulocytes 04/02/2021 0.03  0.00 - 0.07 K/uL Final   Performed at [facility], [address] [place], [place] [id number]   Opiates 04/03/2021 NONE DETECTED  NONE DETECTED Final   Cocaine 04/03/2021 NONE DETECTED  NONE DETECTED Final   Benzodiazepines 04/03/2021 NONE DETECTED  NONE DETECTED Final   Amphetamines 04/03/2021 NONE DETECTED  NONE DETECTED Final   Tetrahydrocannabinol 04/03/2021 POSITIVE (A)  NONE DETECTED Final   Barbiturates 04/03/2021 NONE DETECTED  NONE DETECTED Final   Comment: (NOTE) DRUG SCREEN FOR MEDICAL PURPOSES ONLY.  IF CONFIRMATION IS NEEDED FOR ANY PURPOSE, NOTIFY LAB WITHIN 5 DAYS.  LOWEST DETECTABLE LIMITS FOR URINE DRUG SCREEN Drug Class                     Cutoff (ng/mL) Amphetamine and metabolites    1000 Barbiturate and metabolites    200 Benzodiazepine                 200 Tricyclics and metabolites     300 Opiates and metabolites        300 Cocaine and metabolites        300 THC                            50 Performed at [facility], [address] [place], [place] [id number]    SARS Coronavirus 2 by RT PCR 04/03/2021 NEGATIVE  NEGATIVE Final   Comment: (NOTE) SARS-CoV-2 target nucleic acids are NOT DETECTED.  The SARS-CoV-2 RNA is generally detectable in upper respiratory specimens during the acute phase of infection. The lowest concentration of SARS-CoV-2 viral copies this assay can detect is 138 copies/mL. A negative result does not preclude SARS-Cov-2 infection and should not be  used as the sole basis for treatment or other patient management decisions. A negative result may occur with  improper specimen collection/handling, submission of specimen other than nasopharyngeal swab, presence of viral mutation(s) within the areas targeted by this assay, and inadequate number of viral copies(<138 copies/mL). A negative result must be combined with clinical observations, patient history, and epidemiological information. The expected result is Negative.  Fact Sheet for Patients:  BloggerCourse.comhttps://www.fda.gov/media/152166/download  Fact Sheet for Healthcare Providers:  SeriousBroker.ithttps://www.fda.gov/media/152162/download  This test is no                          t yet approved or cleared by the [place] FDA and  has been authorized for detection and/or diagnosis of SARS-CoV-2 by FDA under an Emergency Use Authorization (EUA). This EUA will remain  in effect (meaning  this test can be used) for the duration of the COVID-19 declaration under Section 564(b)(1) of the Act, 21 U.S.C.section 360bbb-3(b)(1), unless the authorization is terminated  or revoked sooner.       Influenza A by PCR 04/03/2021 NEGATIVE  NEGATIVE Final   Influenza B by PCR 04/03/2021 NEGATIVE  NEGATIVE Final   Comment: (NOTE) The Xpert Xpress SARS-CoV-2/FLU/RSV plus assay is intended as an aid in the diagnosis of influenza from Nasopharyngeal swab specimens and should not be used as a sole basis for treatment. Nasal washings and aspirates are unacceptable for Xpert Xpress SARS-CoV-2/FLU/RSV testing.  Fact Sheet for Patients: BloggerCourse.com  Fact Sheet for Healthcare Providers: SeriousBroker.it  This test is not yet approved or cleared by the [place] FDA and has been authorized for detection and/or diagnosis of SARS-CoV-2 by FDA under an Emergency Use Authorization (EUA). This EUA will remain in effect (meaning this test can be used) for the  duration of the COVID-19 declaration under Section 564(b)(1) of the Act, 21 U.S.C. section 360bbb-3(b)(1), unless the authorization is terminated or revoked.  Performed at [facility], [address], [place], [place] [address]   Admission on 01/31/2021, Discharged on 01/31/2021  Component Date Value Ref Range Status   SARS Coronavirus 2 by RT PCR 01/31/2021 POSITIVE (A)  NEGATIVE Final   Comment: (NOTE) SARS-CoV-2 target nucleic acids are DETECTED.  The SARS-CoV-2 RNA is generally detectable in upper respiratory specimens during the acute phase of infection. Positive results are indicative of the presence of the identified virus, but do not rule out bacterial infection or co-infection with other pathogens not detected by the test. Clinical correlation with patient history and other diagnostic information is necessary to determine patient infection status. The expected result is Negative.  Fact Sheet for Patients: BloggerCourse.com  Fact Sheet for Healthcare Providers: SeriousBroker.it  This test is not yet approved or cleared by the [place] FDA and  has been authorized for detection and/or diagnosis of SARS-CoV-2 by FDA under an Emergency Use Authorization (EUA).  This EUA will remain in effect (meaning this test can be used) for the duration of  the COVID-19 declaration under Section 564(b)(1) of the [place]                          ct, 21 U.S.C. section 360bbb-3(b)(1), unless the authorization is terminated or revoked sooner.     Influenza A by PCR 01/31/2021 NEGATIVE  NEGATIVE Final   Influenza B by PCR 01/31/2021 NEGATIVE  NEGATIVE Final   Comment: (NOTE) The Xpert Xpress SARS-CoV-2/FLU/RSV plus assay is intended as an aid in the diagnosis of influenza from Nasopharyngeal swab specimens and should not be used as a sole basis for treatment. Nasal washings and aspirates are unacceptable for Xpert  Xpress SARS-CoV-2/FLU/RSV testing.  Fact Sheet for Patients: BloggerCourse.com  Fact Sheet for Healthcare Providers: SeriousBroker.it  This test is not yet approved or cleared by the [place] FDA and has been authorized for detection and/or diagnosis of SARS-CoV-2 by FDA under an Emergency Use Authorization (EUA). This EUA will remain in effect (meaning this test can be used) for the duration of the COVID-19 declaration under Section 564(b)(1) of the Act, 21 U.S.C. section 360bbb-3(b)(1), unless the authorization is terminated or revoked.  Performed at [facility], [address] [place], [place] [address]    WBC 01/31/2021 9.1  4.0 - 10.5 K/uL Final   RBC 01/31/2021 5.44  4.22 -  5.81 MIL/uL Final   Hemoglobin 01/31/2021 17.1 (H)  13.0 - 17.0 g/dL Final   HCT 60/73/7106 48.5  39.0 - 52.0 % Final   MCV 01/31/2021 89.2  80.0 - 100.0 fL Final   MCH 01/31/2021 31.4  26.0 - 34.0 pg Final   MCHC 01/31/2021 35.3  30.0 - 36.0 g/dL Final   RDW 26/94/8546 12.8  11.5 - 15.5 % Final   Platelets 01/31/2021 305  150 - 400 K/uL Final   nRBC 01/31/2021 0.0  0.0 - 0.2 % Final   Neutrophils Relative % 01/31/2021 77  % Final   Neutro Abs 01/31/2021 7.0  1.7 - 7.7 K/uL Final   Lymphocytes Relative 01/31/2021 14  % Final   Lymphs Abs 01/31/2021 1.2  0.7 - 4.0 K/uL Final   Monocytes Relative 01/31/2021 8  % Final   Monocytes Absolute 01/31/2021 0.8  0.1 - 1.0 K/uL Final   Eosinophils Relative 01/31/2021 0  % Final   Eosinophils Absolute 01/31/2021 0.0  0.0 - 0.5 K/uL Final   Basophils Relative 01/31/2021 1  % Final   Basophils Absolute 01/31/2021 0.1  0.0 - 0.1 K/uL Final   Immature Granulocytes 01/31/2021 0  % Final   Abs Immature Granulocytes 01/31/2021 0.03  0.00 - 0.07 K/uL Final   Performed at [facility], [address], [place], [place] [address]   Sodium 01/31/2021 135  135 - 145 mmol/L Final   Potassium 01/31/2021 3.9   3.5 - 5.1 mmol/L Final   Chloride 01/31/2021 96 (L)  98 - 111 mmol/L Final   CO2 01/31/2021 25  22 - 32 mmol/L Final   Glucose, Bld 01/31/2021 113 (H)  70 - 99 mg/dL Final   Glucose reference range applies only to samples taken after fasting for at least 8 hours.   BUN 01/31/2021 5 (L)  6 - 20 mg/dL Final   Creatinine, Ser 01/31/2021 0.71  0.61 - 1.24 mg/dL Final   Calcium 00/93/8182 9.4  8.9 - 10.3 mg/dL Final   Total Protein 99/37/1696 8.0  6.5 - 8.1 g/dL Final   Albumin 78/93/8101 4.5  3.5 - 5.0 g/dL Final   AST 75/10/2583 424 (H)  15 - 41 U/L Final   ALT 01/31/2021 342 (H)  0 - 44 U/L Final   Alkaline Phosphatase 01/31/2021 92  38 - 126 U/L Final   Total Bilirubin 01/31/2021 0.8  0.3 - 1.2 mg/dL Final   GFR, Estimated 01/31/2021 >60  >60 mL/min Final   Comment: (NOTE) Calculated using the CKD-EPI Creatinine Equation (2021)    Anion gap 01/31/2021 14  5 - 15 Final   Performed at [facility], [address], [place], [place] [address]   Hgb A1c MFr Bld 01/31/2021 5.2  4.8 - 5.6 % Final   Comment: (NOTE) Pre diabetes:          5.7%-6.4%  Diabetes:              >6.4%  Glycemic control for   <7.0% adults with diabetes    Mean Plasma Glucose 01/31/2021 102.54  mg/dL Final   Performed at [facility], [address] [place], [place] [address]   Magnesium 01/31/2021 2.3  1.7 - 2.4 mg/dL Final   Performed at [facility], [address], [place], [place] [address]   Alcohol, Ethyl (B) 01/31/2021 254 (H)  <10 mg/dL Final   Comment: (NOTE) Lowest detectable limit for serum alcohol is 10 mg/dL.  For  medical purposes only. Performed at [facility], [address], [place], [place] [address]    Cholesterol 01/31/2021 208 (H)  0 - 200 mg/dL Final   Triglycerides 60/45/4098 80  <150 mg/dL Final   HDL 11/91/4782 91  >40 mg/dL Final   Total CHOL/HDL Ratio 01/31/2021 2.3  RATIO Final   VLDL 01/31/2021 16  0 - 40 mg/dL Final   LDL Cholesterol 01/31/2021 101  (H)  0 - 99 mg/dL Final   Comment:        Total Cholesterol/HDL:CHD Risk Coronary Heart Disease Risk Table                     Men   Women  1/2 Average Risk   3.4   3.3  Average Risk       5.0   4.4  2 X Average Risk   9.6   7.1  3 X Average Risk  23.4   11.0        Use the calculated Patient Ratio above and the CHD Risk Table to determine the patient's CHD Risk.        ATP III CLASSIFICATION (LDL):  <100     mg/dL   Optimal  956-213  mg/dL   Near or Above                    Optimal  130-159  mg/dL   Borderline  086-578  mg/dL   High  >469     mg/dL   Very High Performed at [facility], [address] [place], [place] [address]    TSH 01/31/2021 0.569  0.350 - 4.500 uIU/mL Final   Comment: Performed by a 3rd Generation assay with a functional sensitivity of <=0.01 uIU/mL. Performed at [facility], [address], [place], [place] [address]    Prolactin 01/31/2021 12.0  4.0 - 15.2 ng/mL Final   Comment: (NOTE) Performed At: [facility] [address] [place], [place] [id number] Jolene Schimke MD DG:[id number]    POC Amphetamine UR 01/31/2021 None Detected  NONE DETECTED (Cut Off Level 1000 ng/mL) Final   POC Secobarbital (BAR) 01/31/2021 None Detected  NONE DETECTED (Cut Off Level 300 ng/mL) Final   POC Buprenorphine (BUP) 01/31/2021 None Detected  NONE DETECTED (Cut Off Level 10 ng/mL) Final   POC Oxazepam (BZO) 01/31/2021 None Detected  NONE DETECTED (Cut Off Level 300 ng/mL) Final   POC Cocaine UR 01/31/2021 None Detected  NONE DETECTED (Cut Off Level 300 ng/mL) Final   POC Methamphetamine UR 01/31/2021 None Detected  NONE DETECTED (Cut Off Level 1000 ng/mL) Final   POC Morphine 01/31/2021 None Detected  NONE DETECTED (Cut Off Level 300 ng/mL) Final   POC Oxycodone UR 01/31/2021 None Detected  NONE DETECTED (Cut Off Level 100 ng/mL) Final   POC Methadone UR 01/31/2021 None Detected  NONE DETECTED (Cut Off Level 300 ng/mL) Final   POC Marijuana UR  01/31/2021 Positive (A)  NONE DETECTED (Cut Off Level 50 ng/mL) Final   SARS Coronavirus 2 Ag 01/31/2021 Negative  Negative Final  Admission on 01/20/2021, Discharged on 01/21/2021  Component Date Value Ref Range Status   Sodium 01/20/2021 137  135 - 145 mmol/L Final   Potassium 01/20/2021 3.8  3.5 - 5.1 mmol/L Final   Chloride 01/20/2021 96 (L)  98 - 111 mmol/L Final   CO2 01/20/2021 26  22 - 32 mmol/L Final   Glucose, Bld 01/20/2021 105 (H)  70 - 99 mg/dL Final   Glucose reference range applies only to samples taken after fasting for at least 8 hours.   BUN 01/20/2021 7  6 - 20 mg/dL Final   Creatinine, Ser 01/20/2021 0.80  0.61 - 1.24 mg/dL Final   Calcium 16/10/9602 9.0  8.9 - 10.3 mg/dL Final   Total Protein 54/09/8117 8.8 (H)  6.5 - 8.1 g/dL Final   Albumin 14/78/2956 4.8  3.5 - 5.0 g/dL Final   AST 21/30/8657 41  15 - 41 U/L Final   ALT 01/20/2021 39  0 - 44 U/L Final   Alkaline Phosphatase 01/20/2021 90  38 - 126 U/L Final   Total Bilirubin 01/20/2021 1.2  0.3 - 1.2 mg/dL Final   GFR, Estimated 01/20/2021 >60  >60 mL/min Final   Comment: (NOTE) Calculated using the CKD-EPI Creatinine Equation (2021)    Anion gap 01/20/2021 15  5 - 15 Final   Performed at [facility], [address], [place], [place] [address]   Alcohol, Ethyl (B) 01/20/2021 231 (H)  <10 mg/dL Final   Comment: (NOTE) Lowest detectable limit for serum alcohol is 10 mg/dL.  For medical purposes only. Performed at [facility], [address], [place], [place] [address]    Salicylate Lvl 01/20/2021 <7.0 (L)  7.0 - 30.0 mg/dL Final   Performed at [facility], [address], [place], [place] [address]   Acetaminophen (Tylenol), Serum 01/20/2021 <10 (L)  10 - 30 ug/mL Final   Comment: (NOTE) Therapeutic concentrations vary significantly. A range of 10-30 ug/mL  may be an effective concentration for many patients. However, some  are best treated at  concentrations outside of this range. Acetaminophen concentrations >150 ug/mL at 4 hours after ingestion  and >50 ug/mL at 12 hours after ingestion are often associated with  toxic reactions.  Performed at [facility], [address] [place], [place] [address]    WBC 01/20/2021 11.3 (H)  4.0 - 10.5 K/uL Final   RBC 01/20/2021 5.57  4.22 - 5.81 MIL/uL Final   Hemoglobin 01/20/2021 17.7 (H)  13.0 - 17.0 g/dL Final   HCT 72/53/6644 49.6  39.0 - 52.0 % Final   MCV 01/20/2021 89.0  80.0 - 100.0 fL Final   MCH 01/20/2021 31.8  26.0 - 34.0 pg Final   MCHC 01/20/2021 35.7  30.0 - 36.0 g/dL Final   RDW 03/47/4259 12.6  11.5 - 15.5 % Final   Platelets 01/20/2021 400  150 - 400 K/uL Final   nRBC 01/20/2021 0.0  0.0 - 0.2 % Final   Performed at [facility], [address], [place], [place] [address]   Opiates 01/20/2021 NONE DETECTED  NONE DETECTED Final   Cocaine 01/20/2021 NONE DETECTED  NONE DETECTED Final   Benzodiazepines 01/20/2021 NONE DETECTED  NONE DETECTED Final   Amphetamines 01/20/2021 NONE DETECTED  NONE DETECTED Final   Tetrahydrocannabinol 01/20/2021 POSITIVE (A)  NONE DETECTED Final   Barbiturates 01/20/2021 NONE DETECTED  NONE DETECTED Final   Comment: (NOTE) DRUG SCREEN FOR MEDICAL PURPOSES ONLY.  IF CONFIRMATION IS NEEDED FOR ANY PURPOSE, NOTIFY LAB WITHIN 5 DAYS.  LOWEST DETECTABLE LIMITS FOR URINE DRUG SCREEN Drug Class                     Cutoff (ng/mL) Amphetamine and metabolites    1000 Barbiturate and metabolites    200 Benzodiazepine  200 Tricyclics and metabolites     300 Opiates and metabolites        300 Cocaine and metabolites        300 THC                            50 Performed at [facility], [address] [place], [place] [address]    SARS Coronavirus 2 by RT PCR 01/21/2021 NEGATIVE  NEGATIVE Final   Comment: (NOTE) SARS-CoV-2 target nucleic acids are NOT DETECTED.  The  SARS-CoV-2 RNA is generally detectable in upper respiratory specimens during the acute phase of infection. The lowest concentration of SARS-CoV-2 viral copies this assay can detect is 138 copies/mL. A negative result does not preclude SARS-Cov-2 infection and should not be used as the sole basis for treatment or other patient management decisions. A negative result may occur with  improper specimen collection/handling, submission of specimen other than nasopharyngeal swab, presence of viral mutation(s) within the areas targeted by this assay, and inadequate number of viral copies(<138 copies/mL). A negative result must be combined with clinical observations, patient history, and epidemiological information. The expected result is Negative.  Fact Sheet for Patients:  BloggerCourse.com  Fact Sheet for Healthcare Providers:  SeriousBroker.it  This test is no                          t yet approved or cleared by the [place] FDA and  has been authorized for detection and/or diagnosis of SARS-CoV-2 by FDA under an Emergency Use Authorization (EUA). This EUA will remain  in effect (meaning this test can be used) for the duration of the COVID-19 declaration under Section 564(b)(1) of the Act, 21 U.S.C.section 360bbb-3(b)(1), unless the authorization is terminated  or revoked sooner.       Influenza A by PCR 01/21/2021 NEGATIVE  NEGATIVE Final   Influenza B by PCR 01/21/2021 NEGATIVE  NEGATIVE Final   Comment: (NOTE) The Xpert Xpress SARS-CoV-2/FLU/RSV plus assay is intended as an aid in the diagnosis of influenza from Nasopharyngeal swab specimens and should not be used as a sole basis for treatment. Nasal washings and aspirates are unacceptable for Xpert Xpress SARS-CoV-2/FLU/RSV testing.  Fact Sheet for Patients: BloggerCourse.com  Fact Sheet for Healthcare  Providers: SeriousBroker.it  This test is not yet approved or cleared by the [place] FDA and has been authorized for detection and/or diagnosis of SARS-CoV-2 by FDA under an Emergency Use Authorization (EUA). This EUA will remain in effect (meaning this test can be used) for the duration of the COVID-19 declaration under Section 564(b)(1) of the Act, 21 U.S.C. section 360bbb-3(b)(1), unless the authorization is terminated or revoked.  Performed at [facility], [address], [place], [place] [address]     Blood Alcohol level:  Lab Results  Component Value Date   ETH 455 [facility]) 06/19/2021   ETH 374 (HH) 04/02/2021    Metabolic Disorder Labs: Lab Results  Component Value Date   HGBA1C 4.9 04/04/2021   MPG 93.93 04/04/2021   MPG 102.54 01/31/2021   Lab Results  Component Value Date   PROLACTIN 12.0 01/31/2021   Lab Results  Component Value Date   CHOL 208 (H) 01/31/2021   TRIG 80 01/31/2021   HDL 91 01/31/2021   CHOLHDL 2.3 01/31/2021   VLDL 16 01/31/2021   LDLCALC 101 (H) 01/31/2021   LDLCALC 144 (H) 11/18/2019  Therapeutic Lab Levels: No results found for: "LITHIUM" No results found for: "VALPROATE" No results found for: "CBMZ"  Physical Findings   AIMS    Flowsheet Row [facility]) [facility]) [facility]  AIMS Total Score 0 0      AUDIT    Flowsheet Row [facility]) [facility] Ad[facility]) [facility] Visit from 10/30/2019 in [facility]  Alcohol Use Disorder Identification Test Final Score (AUDIT) 36 40 33      CAGE-AID    Flowsheet Row Office Visit from 10/30/2019 in [facility]  CAGE-AID Score 4      GAD-7    Flowsheet Row Office Visit from 10/30/2019 in [facility]  Total GAD-7 Score 21      PHQ2-9    Flowsheet Row ED from 06/20/2021 in [facility] ED from 06/19/2021 in [facility] ED from 01/20/2021 in [facility] Office Visit from 10/30/2019 in [facility]  PHQ-2 Total Score 3 5 6 6   PHQ-9 Total Score 12 20 23 26       Flowsheet Row ED from 06/20/2021 in [facility] ED from 06/19/2021 in [facility] EMERGENCY DEPARTMENT Admission (Discharged[facility]  C-SSRS RISK CATEGORY Low Risk Low Risk No Risk        Musculoskeletal  Strength & Muscle Tone: within normal limits Gait & Station: normal Patient leans: N/A  Psychiatric Specialty Exam  Presentation  General Appearance: Disheveled  Eye Contact:Good  Speech:Clear and Coherent; Normal Rate  Speech Volume:Normal  Handedness:Right   Mood and Affect  Mood:Dysphoric  Affect:Congruent   Thought Process  Thought Processes:Coherent  Descriptions of Associations:Intact  Orientation:Full (Time, Place and Person)  Thought Content:Logical  Diagnosis of Schizophrenia or Schizoaffective disorder in past: No    Hallucinations:Hallucinations: None  Ideas of Reference:None  Suicidal Thoughts:Suicidal Thoughts: No  Homicidal Thoughts:Homicidal Thoughts: No   Sensorium  Memory:Immediate Good; Recent Good; Remote Good  Judgment:Fair  Insight:Fair   Executive Functions  Concentration:Fair  Attention Span:Fair  Recall:Good  Fund of Knowledge:Good  Language:Good   Psychomotor Activity  Psychomotor Activity:Psychomotor Activity: Normal   Assets  Assets:Communication Skills; Desire for Improvement; Financial  Resources/Insurance; Resilience   Sleep  Sleep:Sleep: Poor   Nutritional Assessment (For OBS and [facility] admissions only) Has the patient had a weight loss or gain of 10 pounds or more in the last 3 months?: -- (unsure) Has the patient had a decrease in food intake/or appetite?: Yes Does the patient have dental problems?: No Does the patient have eating habits or behaviors that may be indicators of an eating disorder including binging or inducing vomiting?: No Has the patient recently lost weight without trying?: 2.0 Has the patient been eating poorly because of a decreased appetite?: 1 (related to drinking) Malnutrition Screening Tool Score: 3 Nutritional Assessment Referrals: Refer to Primary Care Provider    Physical Exam  Physical Exam Vitals and nursing note reviewed.  Constitutional:      General: He is not in acute distress.    Appearance: Normal appearance. He is well-developed.  HENT:     Head: Normocephalic and atraumatic.  Eyes:  General:        Right eye: No discharge.        Left eye: No discharge.     Conjunctiva/sclera: Conjunctivae normal.  Cardiovascular:     Rate and Rhythm: Normal rate.  Pulmonary:     Effort: Pulmonary effort is normal. No respiratory distress.  Musculoskeletal:        General: No tenderness. Normal range of motion.     Cervical back: Normal range of motion.  Skin:    Capillary Refill: Capillary refill takes less than 2 seconds.     Coloration: Skin is not jaundiced or pale.  Neurological:     Mental Status: He is alert and oriented to person, place, and time.  Psychiatric:        Attention and Perception: Attention normal.        Mood and Affect: Mood is anxious and depressed.        Speech: Speech normal.        Behavior: Behavior is cooperative.        Thought Content: Thought content normal.        Cognition and Memory: Cognition normal.        Judgment: Judgment is impulsive.    Review of Systems  Constitutional:  Negative.   HENT: Negative.    Eyes: Negative.   Respiratory: Negative.    Cardiovascular: Negative.   Musculoskeletal: Negative.   Skin: Negative.   Neurological: Negative.   Psychiatric/Behavioral:  Positive for depression and substance abuse. The patient is nervous/anxious.    Blood pressure 115/88, pulse 78, temperature 98.4 F (36.9 C), resp. rate 18, SpO2 97 %. There is no height or weight on file to calculate BMI.  Treatment Plan Summary:  Disposition: Ongoing.  Ativan taper scheduled and Monday 06/23/2021.  Patient may be interested in residential substance abuse treatment but is unsure at this time.  We will continue to have daily contact with patient to assess and evaluate symptoms and progress in treatment and Medication management.   Seroquel 50 mg nightly initiated for mood stability and sleep.  Ardis Hughs, NP 06/21/2021 11:49 AM

## 2021-06-21 NOTE — ED Notes (Signed)
Pt sitting in dining room watching TV. A&O x4, calm and cooperative. Denies SI/HI/AVH. No signs of acute distress noted. Will continue to monitor for safety.  

## 2021-06-21 NOTE — ED Notes (Addendum)
Pt resting in bed in no acute distress. RR even and unlabored. Safety maintained.

## 2021-06-21 NOTE — ED Notes (Signed)
Pt is in the bed sleeping. Respirations are even and unlabored. No acute distress noted. Will continue to monitor for safety. 

## 2021-06-21 NOTE — ED Notes (Signed)
Pt sleeping in no acute distress. RR even and unlabored. Safety maintained. 

## 2021-06-21 NOTE — ED Notes (Signed)
Pt currently resting in bed, has been up and down several times to bathroom. Respirations even and unlabored. Will continue to monitor for safety.

## 2021-06-22 DIAGNOSIS — F10229 Alcohol dependence with intoxication, unspecified: Secondary | ICD-10-CM | POA: Diagnosis not present

## 2021-06-22 DIAGNOSIS — F10230 Alcohol dependence with withdrawal, uncomplicated: Secondary | ICD-10-CM | POA: Diagnosis not present

## 2021-06-22 DIAGNOSIS — F332 Major depressive disorder, recurrent severe without psychotic features: Secondary | ICD-10-CM | POA: Diagnosis not present

## 2021-06-22 DIAGNOSIS — F1014 Alcohol abuse with alcohol-induced mood disorder: Secondary | ICD-10-CM | POA: Diagnosis not present

## 2021-06-22 LAB — POTASSIUM: Potassium: 3.5 mmol/L (ref 3.5–5.1)

## 2021-06-22 NOTE — ED Notes (Signed)
Watching movie with peer 

## 2021-06-22 NOTE — ED Notes (Signed)
Patient remains isolative and guarded with poor insight into illness.  Patient exhibiting denial around the severity of his alcohol use.  Education attempted.  Patient reported continued diahrea  and imodium 2mg  PO PRN given to alleviate symptoms.  Patient is disheveled and malodorous and was encouraged to attend to ADL's.  Will continue to prompt him.  Patient is calm and pleasant however minimally verbal.  Patient did eat a small amount of breakfast this morning with prompts from staff.  He presently denies avh shi or plan.  Will continue to monitor and provide a safe environment.

## 2021-06-22 NOTE — ED Notes (Addendum)
Pt asked for a fruit cup and yogurt

## 2021-06-22 NOTE — ED Notes (Signed)
Notified pt that breakfast is ready 

## 2021-06-22 NOTE — ED Notes (Signed)
Snack given.

## 2021-06-22 NOTE — ED Notes (Signed)
Pt asleep in bed. Respirations even and unlabored. Will continue to monitor for safety. ?

## 2021-06-22 NOTE — Progress Notes (Signed)
Patient was encouraged to take a shower and change scrubs which were visibly dirty.  He complied and new scrubs given.  He has been given a snack and is sitting in dayroom socializing with male peer.  Patient is calm and pleasant with soft speech and simple thought process.  Insight into etoh use is limited.  No bouts of urinary or bowel incontinence.  He has slight fine tremors of hands but no tongue vesiculation and v.s. are stable.  Will continue to monitor and meet needs as they arise.

## 2021-06-22 NOTE — ED Notes (Signed)
Patient resting with no sxs of distress noted - will continue to monitor 

## 2021-06-22 NOTE — ED Provider Notes (Signed)
Behavioral Health Progress Note  Date and Time: 06/22/2021 2:30 PM Name: Thomas [name] MRN:  [id number]  Subjective:  Ephriam Knuckles A [name] 31 y.o., male patient who initially presented to [facility] ED under IVC on 06/19/2021.  He was transferred to the [facility] on 06/20/2021 for continued detox and crisis stabilization'  Thomas [name], 31 y.o., male patient seen face to face by this provider, consulted with Dr. Lucianne Muss; and chart reviewed on 06/22/21.  Patient has a past psychiatric history of bipolar and panic disorder.  Upon admission patient's EtOH is 455 and UDS is negative.  During evaluation Thomas [name] is laying in his bed with the sheets over his face.  He is alert/oriented x4 and cooperative.  He makes fair eye contact and is disheveled.  He has normal speech and behavior.  He continues to endorse some depression and anxiety, but relates it to his alcohol use.  He tolerated the Seroquel with no adverse reactions and reports it was helpful but helping him sleep.  Reports his appetite is still decreased but he is forcing himself to eat food throughout the day.  He denies any incontinence over the past 24 hours.  He continues to deny SI/HI/AVH.  He is able to converse coherently and is able to answer questions appropriately.  He exhibits no signs of psychosis, paranoia, or delusional thought.  At this time he is denying any alcohol withdrawal symptoms.  Reports yesterday his tremor was visible and bothersome.  He does not appear to be tremulous at this time.  He continues to tolerate the Ativan taper without any adverse reactions.  His CIWA scores over the past 24 hours have been 7 and below.  Patient states he would like to see how he feels tomorrow but he thinks he is getting close to being discharged.  He continues to refuse any type of substance abuse treatment including residential and outpatient.  States, "I think I will be fine when I get out I just need a couple of weeks to get myself  straight".  Patient's potassium rechecked on 06/22/2021 it is now 3.5 WNL.  Collateral: Thomas Epple (patient's mother) [phone].  Contacted patient's mother with patient's permission.  Mother states patient is getting to a place where he can no longer care for himself.  States he drives his car around town with empty beer cans up to the window intoxicated.  He drinks until he passes out and lays in feces for days.  He does not attend to his own personal hygiene and he is not eating.  She has gathered the information to petition to be patient's legal guardian but she has not started the process yet.  She understands that patient does not meet the criteria for IVC nor inpatient psychiatric admission.  She is aware that patient could possibly be discharged tomorrow after his Ativan taper has ended.  Diagnosis:  Final diagnoses:  None    Total Time spent with patient: 30 minutes  Past Psychiatric History: See H&P Past Medical History:  Past Medical History:  Diagnosis Date   Anxiety     Past Surgical History:  Procedure Laterality Date   APPENDECTOMY     Family History: No family history on file. Family Psychiatric  History: See H&P Social History:  Social History   Substance and Sexual Activity  Alcohol Use Yes   Alcohol/week: 140.0 standard drinks of alcohol   Types: 140 Cans of beer per week   Comment: reported 20+ beers/day  Social History   Substance and Sexual Activity  Drug Use No    Social History   Socioeconomic History   Marital status: Single    Spouse name: Not on file   Number of children: Not on file   Years of education: Not on file   Highest education level: Not on file  Occupational History   Not on file  Tobacco Use   Smoking status: Never   Smokeless tobacco: Never  Vaping Use   Vaping Use: Never used  Substance and Sexual Activity   Alcohol use: Yes    Alcohol/week: 140.0 standard drinks of alcohol    Types: 140 Cans of beer per week     Comment: reported 20+ beers/day   Drug use: No   Sexual activity: Yes  Other Topics Concern   Not on file  Social History Narrative   Not on file   Social Determinants of Health   Financial Resource Strain: Not on file  Food Insecurity: Not on file  Transportation Needs: Not on file  Physical Activity: Not on file  Stress: Not on file  Social Connections: Not on file   SDOH:  SDOH Screenings   Alcohol Screen: Medium Risk (04/09/2021)   Alcohol Screen    Last Alcohol Screening Score (AUDIT): 36  Depression (PHQ2-9): Medium Risk (06/20/2021)   Depression (PHQ2-9)    PHQ-2 Score: 12  Financial Resource Strain: Not on file  Food Insecurity: Not on file  Housing: Not on file  Physical Activity: Not on file  Social Connections: Not on file  Stress: Not on file  Tobacco Use: Low Risk  (06/19/2021)   Patient History    Smoking Tobacco Use: Never    Smokeless Tobacco Use: Never    Passive Exposure: Not on file  Transportation Needs: Not on file   Additional Social History:         Sleep: Fair  Appetite:  Fair  Current Medications:  Current Facility-Administered Medications  Medication Dose Route Frequency Provider Last Rate Last Admin   acetaminophen (TYLENOL) tablet 650 mg  650 mg Oral Q6H PRN Estella Husk, MD       alum & mag hydroxide-simeth (MAALOX/MYLANTA) 200-200-20 MG/5ML suspension 30 mL  30 mL Oral Q4H PRN Estella Husk, MD       feeding supplement (BOOST / RESOURCE BREEZE) liquid 1 Container  1 Container Oral TID BM Ajibola, Ene A, NP   1 Container at 06/21/21 2100   gabapentin (NEURONTIN) capsule 300 mg  300 mg Oral TID Estella Husk, MD   300 mg at 06/22/21 0944   [START ON 06/23/2021] hydrOXYzine (ATARAX) tablet 25 mg  25 mg Oral TID PRN Estella Husk, MD       hydrOXYzine (ATARAX) tablet 25 mg  25 mg Oral Q6H PRN Estella Husk, MD   25 mg at 06/20/21 1951   loperamide (IMODIUM) capsule 2-4 mg  2-4 mg Oral PRN Estella Husk, MD   2 mg at 06/22/21 1001   LORazepam (ATIVAN) tablet 1 mg  1 mg Oral Q6H PRN Estella Husk, MD   1 mg at 06/20/21 1250   LORazepam (ATIVAN) tablet 1 mg  1 mg Oral BID Estella Husk, MD   1 mg at 06/22/21 4854   Followed by   Melene Muller ON 06/23/2021] LORazepam (ATIVAN) tablet 1 mg  1 mg Oral Daily Estella Husk, MD       magnesium hydroxide (MILK OF MAGNESIA) suspension  30 mL  30 mL Oral Daily PRN Estella Husk, MD       multivitamin with minerals tablet 1 tablet  1 tablet Oral Daily Estella Husk, MD   1 tablet at 06/22/21 0943   ondansetron (ZOFRAN-ODT) disintegrating tablet 4 mg  4 mg Oral Q6H PRN Estella Husk, MD       QUEtiapine (SEROQUEL) tablet 50 mg  50 mg Oral QHS Vernard Gambles H, NP   50 mg at 06/21/21 2148   sertraline (ZOLOFT) tablet 50 mg  50 mg Oral Daily Estella Husk, MD   50 mg at 06/22/21 0944   thiamine tablet 100 mg  100 mg Oral Daily Estella Husk, MD   100 mg at 06/22/21 0944   traZODone (DESYREL) tablet 100 mg  100 mg Oral QHS PRN Estella Husk, MD   100 mg at 06/21/21 2148   Current Outpatient Medications  Medication Sig Dispense Refill   gabapentin (NEURONTIN) 300 MG capsule Take 1 capsule (300 mg total) by mouth 3 (three) times daily. (Patient not taking: Reported on 06/19/2021) 90 capsule 0   sertraline (ZOLOFT) 50 MG tablet Take 1 tablet (50 mg total) by mouth daily. (Patient not taking: Reported on 06/19/2021) 30 tablet 0   traZODone (DESYREL) 50 MG tablet Take 1 tablet (50 mg total) by mouth at bedtime as needed for sleep. (Patient not taking: Reported on 06/19/2021) 30 tablet 0    Labs  Lab Results:  Admission on 06/20/2021  Component Date Value Ref Range Status   Potassium 06/22/2021 3.5  3.5 - 5.1 mmol/L Final   Performed at [facility], [address] [place], [place] [address]  Admission on 06/19/2021, Discharged on 06/20/2021  Component Date Value Ref Range Status   Sodium  06/19/2021 141  135 - 145 mmol/L Final   Potassium 06/19/2021 3.4 (L)  3.5 - 5.1 mmol/L Final   Chloride 06/19/2021 99  98 - 111 mmol/L Final   CO2 06/19/2021 23  22 - 32 mmol/L Final   Glucose, Bld 06/19/2021 114 (H)  70 - 99 mg/dL Final   Glucose reference range applies only to samples taken after fasting for at least 8 hours.   BUN 06/19/2021 <5 (L)  6 - 20 mg/dL Final   Creatinine, Ser 06/19/2021 0.82  0.61 - 1.24 mg/dL Final   Calcium 87/56/4332 8.7 (L)  8.9 - 10.3 mg/dL Final   Total Protein 95/18/8416 7.7  6.5 - 8.1 g/dL Final   Albumin 60/63/0160 4.1  3.5 - 5.0 g/dL Final   AST 10/93/2355 80 (H)  15 - 41 U/L Final   ALT 06/19/2021 59 (H)  0 - 44 U/L Final   Alkaline Phosphatase 06/19/2021 121  38 - 126 U/L Final   Total Bilirubin 06/19/2021 1.0  0.3 - 1.2 mg/dL Final   GFR, Estimated 06/19/2021 >60  >60 mL/min Final   Comment: (NOTE) Calculated using the CKD-EPI Creatinine Equation (2021)    Anion gap 06/19/2021 19 (H)  5 - 15 Final   Performed at [facility], [address], [place], [place] [address]   Alcohol, Ethyl (B) 06/19/2021 455 (HH)  <10 mg/dL Final   Comment: CRITICAL RESULT CALLED TO, READ BACK BY AND VERIFIED WITH: A.BANKS,RN @1915  06/19/2021 VANG.J (NOTE) Lowest detectable limit for serum alcohol is 10 mg/dL.  For medical purposes only. Performed at [facility], [address], [place], [place]    Salicylate Lvl 06/19/2021 <  7.0 (L)  7.0 - 30.0 mg/dL Final   Performed at [facility], [address], [place], [place] [address]   Acetaminophen (Tylenol), Serum 06/19/2021 <10 (L)  10 - 30 ug/mL Final   Comment: (NOTE) Therapeutic concentrations vary significantly. A range of 10-30 ug/mL  may be an effective concentration for many patients. However, some  are best treated at concentrations outside of this range. Acetaminophen concentrations >150 ug/mL at 4 hours after ingestion  and >50 ug/mL at 12 hours after ingestion are often  associated with  toxic reactions.  Performed at [facility], [address] [place], [place] [address]    WBC 06/19/2021 9.5  4.0 - 10.5 K/uL Final   RBC 06/19/2021 4.96  4.22 - 5.81 MIL/uL Final   Hemoglobin 06/19/2021 16.9  13.0 - 17.0 g/dL Final   HCT 09/81/1914 46.8  39.0 - 52.0 % Final   MCV 06/19/2021 94.4  80.0 - 100.0 fL Final   MCH 06/19/2021 34.1 (H)  26.0 - 34.0 pg Final   MCHC 06/19/2021 36.1 (H)  30.0 - 36.0 g/dL Final   RDW 78/29/5621 13.2  11.5 - 15.5 % Final   Platelets 06/19/2021 298  150 - 400 K/uL Final   nRBC 06/19/2021 0.0  0.0 - 0.2 % Final   Performed at [facility], [address], [place], [place] [address]   Opiates 06/19/2021 NONE DETECTED  NONE DETECTED Final   Cocaine 06/19/2021 NONE DETECTED  NONE DETECTED Final   Benzodiazepines 06/19/2021 NONE DETECTED  NONE DETECTED Final   Amphetamines 06/19/2021 NONE DETECTED  NONE DETECTED Final   Tetrahydrocannabinol 06/19/2021 NONE DETECTED  NONE DETECTED Final   Barbiturates 06/19/2021 NONE DETECTED  NONE DETECTED Final   Comment: (NOTE) DRUG SCREEN FOR MEDICAL PURPOSES ONLY.  IF CONFIRMATION IS NEEDED FOR ANY PURPOSE, NOTIFY LAB WITHIN 5 DAYS.  LOWEST DETECTABLE LIMITS FOR URINE DRUG SCREEN Drug Class                     Cutoff (ng/mL) Amphetamine and metabolites    1000 Barbiturate and metabolites    200 Benzodiazepine                 200 Tricyclics and metabolites     300 Opiates and metabolites        300 Cocaine and metabolites        300 THC                            50 Performed at [facility], [address] [place], [place] [address]    SARS Coronavirus 2 by RT PCR 06/19/2021 NEGATIVE  NEGATIVE Final   Comment: (NOTE) SARS-CoV-2 target nucleic acids are NOT DETECTED.  The SARS-CoV-2 RNA is generally detectable in upper respiratory specimens during the acute phase of infection. The lowest concentration of SARS-CoV-2 viral copies this assay can detect is 138  copies/mL. A negative result does not preclude SARS-Cov-2 infection and should not be used as the sole basis for treatment or other patient management decisions. A negative result may occur with  improper specimen collection/handling, submission of specimen other than nasopharyngeal swab, presence of viral mutation(s) within the areas targeted by this assay, and inadequate number of viral copies(<138 copies/mL). A negative result must be combined with clinical observations, patient history, and epidemiological information. The expected result is Negative.  Fact Sheet for Patients:  BloggerCourse.com  Fact Sheet for Healthcare Providers:  SeriousBroker.it  This test is no                          t yet approved or cleared by the [place] FDA and  has been authorized for detection and/or diagnosis of SARS-CoV-2 by FDA under an Emergency Use Authorization (EUA). This EUA will remain  in effect (meaning this test can be used) for the duration of the COVID-19 declaration under Section 564(b)(1) of the Act, 21 U.S.C.section 360bbb-3(b)(1), unless the authorization is terminated  or revoked sooner.       Influenza A by PCR 06/19/2021 NEGATIVE  NEGATIVE Final   Influenza B by PCR 06/19/2021 NEGATIVE  NEGATIVE Final   Comment: (NOTE) The Xpert Xpress SARS-CoV-2/FLU/RSV plus assay is intended as an aid in the diagnosis of influenza from Nasopharyngeal swab specimens and should not be used as a sole basis for treatment. Nasal washings and aspirates are unacceptable for Xpert Xpress SARS-CoV-2/FLU/RSV testing.  Fact Sheet for Patients: BloggerCourse.com  Fact Sheet for Healthcare Providers: SeriousBroker.it  This test is not yet approved or cleared by the [place] FDA and has been authorized for detection and/or diagnosis of SARS-CoV-2 by FDA under an Emergency Use Authorization  (EUA). This EUA will remain in effect (meaning this test can be used) for the duration of the COVID-19 declaration under Section 564(b)(1) of the Act, 21 U.S.C. section 360bbb-3(b)(1), unless the authorization is terminated or revoked.  Performed at [facility], [address], [place], [place] [address]    Color, Urine 06/19/2021 YELLOW  YELLOW Final   APPearance 06/19/2021 CLEAR  CLEAR Final   Specific Gravity, Urine 06/19/2021 1.006  1.005 - 1.030 Final   pH 06/19/2021 5.0  5.0 - 8.0 Final   Glucose, UA 06/19/2021 NEGATIVE  NEGATIVE mg/dL Final   Hgb urine dipstick 06/19/2021 MODERATE (A)  NEGATIVE Final   Bilirubin Urine 06/19/2021 NEGATIVE  NEGATIVE Final   Ketones, ur 06/19/2021 NEGATIVE  NEGATIVE mg/dL Final   Protein, ur 76/28/3151 NEGATIVE  NEGATIVE mg/dL Final   Nitrite 76/16/0737 NEGATIVE  NEGATIVE Final   Leukocytes,Ua 06/19/2021 NEGATIVE  NEGATIVE Final   RBC / HPF 06/19/2021 0-5  0 - 5 RBC/hpf Final   WBC, UA 06/19/2021 0-5  0 - 5 WBC/hpf Final   Bacteria, UA 06/19/2021 NONE SEEN  NONE SEEN Final   Squamous Epithelial / LPF 06/19/2021 0-5  0 - 5 Final   Mucus 06/19/2021 PRESENT   Final   Hyaline Casts, UA 06/19/2021 PRESENT   Final   Performed at [facility], [address], [place], [place] [address]   Lipase 06/19/2021 58 (H)  11 - 51 U/L Final   Performed at [facility], [address] [place], [place] [address]  Admission on 04/03/2021, Discharged on 04/08/2021  Component Date Value Ref Range Status   Color, Urine 04/03/2021 AMBER (A)  YELLOW Final   BIOCHEMICALS MAY BE AFFECTED BY COLOR   APPearance 04/03/2021 CLEAR  CLEAR Final   Specific Gravity, Urine 04/03/2021 1.021  1.005 - 1.030 Final   pH 04/03/2021 6.0  5.0 - 8.0 Final   Glucose, UA 04/03/2021 NEGATIVE  NEGATIVE mg/dL Final   Hgb urine dipstick 04/03/2021 NEGATIVE  NEGATIVE Final   Bilirubin Urine 04/03/2021 NEGATIVE  NEGATIVE Final   Ketones, ur 04/03/2021 NEGATIVE  NEGATIVE  mg/dL Final   Protein, ur 62/70/3500 NEGATIVE  NEGATIVE mg/dL Final  Nitrite 04/03/2021 NEGATIVE  NEGATIVE Final   Leukocytes,Ua 04/03/2021 NEGATIVE  NEGATIVE Final   RBC / HPF 04/03/2021 0-5  0 - 5 RBC/hpf Final   WBC, UA 04/03/2021 0-5  0 - 5 WBC/hpf Final   Bacteria, UA 04/03/2021 NONE SEEN  NONE SEEN Final   Mucus 04/03/2021 PRESENT   Final   Performed at [facility], [address] [place], [place] [address]   WBC 04/04/2021 7.9  4.0 - 10.5 K/uL Final   RBC 04/04/2021 4.61  4.22 - 5.81 MIL/uL Final   Hemoglobin 04/04/2021 15.2  13.0 - 17.0 g/dL Final   HCT 60/45/4098 42.9  39.0 - 52.0 % Final   MCV 04/04/2021 93.1  80.0 - 100.0 fL Final   MCH 04/04/2021 33.0  26.0 - 34.0 pg Final   MCHC 04/04/2021 35.4  30.0 - 36.0 g/dL Final   RDW 11/91/4782 13.2  11.5 - 15.5 % Final   Platelets 04/04/2021 338  150 - 400 K/uL Final   nRBC 04/04/2021 0.0  0.0 - 0.2 % Final   Performed at [facility], [address] [place], [place] [address]   Sodium 04/04/2021 132 (L)  135 - 145 mmol/L Final   Potassium 04/04/2021 3.2 (L)  3.5 - 5.1 mmol/L Final   Chloride 04/04/2021 92 (L)  98 - 111 mmol/L Final   CO2 04/04/2021 29  22 - 32 mmol/L Final   Glucose, Bld 04/04/2021 109 (H)  70 - 99 mg/dL Final   Glucose reference range applies only to samples taken after fasting for at least 8 hours.   BUN 04/04/2021 13  6 - 20 mg/dL Final   Creatinine, Ser 04/04/2021 0.75  0.61 - 1.24 mg/dL Final   Calcium 30/86/5784 9.5  8.9 - 10.3 mg/dL Final   Total Protein 69/62/9528 7.5  6.5 - 8.1 g/dL Final   Albumin 41/32/4401 4.2  3.5 - 5.0 g/dL Final   AST 02/72/5366 38  15 - 41 U/L Final   ALT 04/04/2021 56 (H)  0 - 44 U/L Final   Alkaline Phosphatase 04/04/2021 74  38 - 126 U/L Final   Total Bilirubin 04/04/2021 1.2  0.3 - 1.2 mg/dL Final   GFR, Estimated 04/04/2021 >60  >60 mL/min Final   Comment: (NOTE) Calculated using the CKD-EPI Creatinine Equation (2021)    Anion  gap 04/04/2021 11  5 - 15 Final   Performed at [facility], [address], [place], [place] [address]   Hgb A1c MFr Bld 04/04/2021 4.9  4.8 - 5.6 % Final   Comment: (NOTE) Pre diabetes:          5.7%-6.4%  Diabetes:              >6.4%  Glycemic control for   <7.0% adults with diabetes    Mean Plasma Glucose 04/04/2021 93.93  mg/dL Final   Performed at [facility], [address] [place], [place] [address]   Total Protein 04/04/2021 7.6  6.5 - 8.1 g/dL Final   Albumin 56/38/7564 4.2  3.5 - 5.0 g/dL Final   AST 33/29/5188 38  15 - 41 U/L Final   ALT 04/04/2021 55 (H)  0 - 44 U/L Final   Alkaline Phosphatase 04/04/2021 72  38 - 126 U/L Final   Total Bilirubin 04/04/2021 1.1  0.3 - 1.2 mg/dL Final   Bilirubin, Direct 04/04/2021 0.2  0.0 - 0.2 mg/dL Final   Indirect Bilirubin 04/04/2021 0.9  0.3 -  0.9 mg/dL Final   Performed at [facility], [address], [place], [place] [address]   TSH 04/04/2021 1.533  0.350 - 4.500 uIU/mL Final   Comment: Performed by a 3rd Generation assay with a functional sensitivity of <=0.01 uIU/mL. Performed at [facility], [address], [place], [place] [address]    Sodium 04/06/2021 137  135 - 145 mmol/L Final   Potassium 04/06/2021 3.7  3.5 - 5.1 mmol/L Final   Chloride 04/06/2021 101  98 - 111 mmol/L Final   CO2 04/06/2021 27  22 - 32 mmol/L Final   Glucose, Bld 04/06/2021 107 (H)  70 - 99 mg/dL Final   Glucose reference range applies only to samples taken after fasting for at least 8 hours.   BUN 04/06/2021 8  6 - 20 mg/dL Final   Creatinine, Ser 04/06/2021 0.87  0.61 - 1.24 mg/dL Final   Calcium 30/94/0768 9.6  8.9 - 10.3 mg/dL Final   Total Protein 08/81/1031 7.8  6.5 - 8.1 g/dL Final   Albumin 59/45/8592 4.2  3.5 - 5.0 g/dL Final   AST 92/44/6286 38  15 - 41 U/L Final   ALT 04/06/2021 56 (H)  0 - 44 U/L Final   Alkaline Phosphatase 04/06/2021 72  38 - 126 U/L Final   Total  Bilirubin 04/06/2021 0.9  0.3 - 1.2 mg/dL Final   GFR, Estimated 04/06/2021 >60  >60 mL/min Final   Comment: (NOTE) Calculated using the CKD-EPI Creatinine Equation (2021)    Anion gap 04/06/2021 9  5 - 15 Final   Performed at [facility], [address], [place], [place] [address]  Admission on 04/02/2021, Discharged on 04/03/2021  Component Date Value Ref Range Status   Acetaminophen (Tylenol), Serum 04/02/2021 <10 (L)  10 - 30 ug/mL Final   Comment: (NOTE) Therapeutic concentrations vary significantly. A range of 10-30 ug/mL  may be an effective concentration for many patients. However, some  are best treated at concentrations outside of this range. Acetaminophen concentrations >150 ug/mL at 4 hours after ingestion  and >50 ug/mL at 12 hours after ingestion are often associated with  toxic reactions.  Performed at [facility], [address] [place], [place] [address]    Sodium 04/02/2021 137  135 - 145 mmol/L Final   Potassium 04/02/2021 3.5  3.5 - 5.1 mmol/L Final   Chloride 04/02/2021 97 (L)  98 - 111 mmol/L Final   CO2 04/02/2021 25  22 - 32 mmol/L Final   Glucose, Bld 04/02/2021 114 (H)  70 - 99 mg/dL Final   Glucose reference range applies only to samples taken after fasting for at least 8 hours.   BUN 04/02/2021 6  6 - 20 mg/dL Final   Creatinine, Ser 04/02/2021 0.65  0.61 - 1.24 mg/dL Final   Calcium 90/38/3338 9.0  8.9 - 10.3 mg/dL Final   Total Protein 32/91/9166 9.0 (H)  6.5 - 8.1 g/dL Final   Albumin 06/00/4599 4.9  3.5 - 5.0 g/dL Final   AST 77/41/4239 47 (H)  15 - 41 U/L Final   ALT 04/02/2021 71 (H)  0 - 44 U/L Final   Alkaline Phosphatase 04/02/2021 89  38 - 126 U/L Final   Total Bilirubin 04/02/2021 0.3  0.3 - 1.2 mg/dL Final   GFR, Estimated 04/02/2021 >60  >60 mL/min Final   Comment: (NOTE) Calculated using the CKD-EPI Creatinine Equation (2021)    Anion gap 04/02/2021 15  5 -  15 Final   Performed at [facility], [address], [place], [place] [address]   Alcohol, Ethyl (B) 04/02/2021 374 (HH)  <10 mg/dL Final   Comment: CRITICAL RESULT CALLED TO, READ BACK BY AND VERIFIED WITH:  FELICIA GREEN RN 04/02/21 @ 2322 VS (NOTE) Lowest detectable limit for serum alcohol is 10 mg/dL.  For medical purposes only. Performed at [facility], [address], [place], [place] [address]    WBC 04/02/2021 6.9  4.0 - 10.5 K/uL Final   RBC 04/02/2021 5.33  4.22 - 5.81 MIL/uL Final   Hemoglobin 04/02/2021 17.5 (H)  13.0 - 17.0 g/dL Final   HCT 09/81/1914 49.0  39.0 - 52.0 % Final   MCV 04/02/2021 91.9  80.0 - 100.0 fL Final   MCH 04/02/2021 32.8  26.0 - 34.0 pg Final   MCHC 04/02/2021 35.7  30.0 - 36.0 g/dL Final   RDW 78/29/5621 13.3  11.5 - 15.5 % Final   Platelets 04/02/2021 338  150 - 400 K/uL Final   nRBC 04/02/2021 0.0  0.0 - 0.2 % Final   Neutrophils Relative % 04/02/2021 60  % Final   Neutro Abs 04/02/2021 4.0  1.7 - 7.7 K/uL Final   Lymphocytes Relative 04/02/2021 29  % Final   Lymphs Abs 04/02/2021 2.0  0.7 - 4.0 K/uL Final   Monocytes Relative 04/02/2021 9  % Final   Monocytes Absolute 04/02/2021 0.6  0.1 - 1.0 K/uL Final   Eosinophils Relative 04/02/2021 1  % Final   Eosinophils Absolute 04/02/2021 0.1  0.0 - 0.5 K/uL Final   Basophils Relative 04/02/2021 1  % Final   Basophils Absolute 04/02/2021 0.1  0.0 - 0.1 K/uL Final   Immature Granulocytes 04/02/2021 0  % Final   Abs Immature Granulocytes 04/02/2021 0.03  0.00 - 0.07 K/uL Final   Performed at [facility], [address], [place], [place] [address]   Opiates 04/03/2021 NONE DETECTED  NONE DETECTED Final   Cocaine 04/03/2021 NONE DETECTED  NONE DETECTED Final   Benzodiazepines 04/03/2021 NONE DETECTED  NONE DETECTED Final   Amphetamines 04/03/2021 NONE DETECTED  NONE DETECTED Final   Tetrahydrocannabinol 04/03/2021 POSITIVE (A)  NONE DETECTED Final   Barbiturates 04/03/2021 NONE  DETECTED  NONE DETECTED Final   Comment: (NOTE) DRUG SCREEN FOR MEDICAL PURPOSES ONLY.  IF CONFIRMATION IS NEEDED FOR ANY PURPOSE, NOTIFY LAB WITHIN 5 DAYS.  LOWEST DETECTABLE LIMITS FOR URINE DRUG SCREEN Drug Class                     Cutoff (ng/mL) Amphetamine and metabolites    1000 Barbiturate and metabolites    200 Benzodiazepine                 200 Tricyclics and metabolites     300 Opiates and metabolites        300 Cocaine and metabolites        300 THC                            50 Performed at [facility], [address], [place], [place] [address]    SARS Coronavirus 2 by RT PCR 04/03/2021 NEGATIVE  NEGATIVE Final   Comment: (NOTE) SARS-CoV-2 target nucleic acids are NOT DETECTED.  The SARS-CoV-2 RNA is generally detectable in upper respiratory specimens during the acute phase of infection. The lowest concentration of  SARS-CoV-2 viral copies this assay can detect is 138 copies/mL. A negative result does not preclude SARS-Cov-2 infection and should not be used as the sole basis for treatment or other patient management decisions. A negative result may occur with  improper specimen collection/handling, submission of specimen other than nasopharyngeal swab, presence of viral mutation(s) within the areas targeted by this assay, and inadequate number of viral copies(<138 copies/mL). A negative result must be combined with clinical observations, patient history, and epidemiological information. The expected result is Negative.  Fact Sheet for Patients:  BloggerCourse.com  Fact Sheet for Healthcare Providers:  SeriousBroker.it  This test is no                          t yet approved or cleared by the [place] FDA and  has been authorized for detection and/or diagnosis of SARS-CoV-2 by FDA under an Emergency Use Authorization (EUA). This EUA will remain  in effect (meaning this test can be  used) for the duration of the COVID-19 declaration under Section 564(b)(1) of the Act, 21 U.S.C.section 360bbb-3(b)(1), unless the authorization is terminated  or revoked sooner.       Influenza A by PCR 04/03/2021 NEGATIVE  NEGATIVE Final   Influenza B by PCR 04/03/2021 NEGATIVE  NEGATIVE Final   Comment: (NOTE) The Xpert Xpress SARS-CoV-2/FLU/RSV plus assay is intended as an aid in the diagnosis of influenza from Nasopharyngeal swab specimens and should not be used as a sole basis for treatment. Nasal washings and aspirates are unacceptable for Xpert Xpress SARS-CoV-2/FLU/RSV testing.  Fact Sheet for Patients: BloggerCourse.com  Fact Sheet for Healthcare Providers: SeriousBroker.it  This test is not yet approved or cleared by the [place] FDA and has been authorized for detection and/or diagnosis of SARS-CoV-2 by FDA under an Emergency Use Authorization (EUA). This EUA will remain in effect (meaning this test can be used) for the duration of the COVID-19 declaration under Section 564(b)(1) of the Act, 21 U.S.C. section 360bbb-3(b)(1), unless the authorization is terminated or revoked.  Performed at [facility], [address], [place], [place] [address]   Admission on 01/31/2021, Discharged on 01/31/2021  Component Date Value Ref Range Status   SARS Coronavirus 2 by RT PCR 01/31/2021 POSITIVE (A)  NEGATIVE Final   Comment: (NOTE) SARS-CoV-2 target nucleic acids are DETECTED.  The SARS-CoV-2 RNA is generally detectable in upper respiratory specimens during the acute phase of infection. Positive results are indicative of the presence of the identified virus, but do not rule out bacterial infection or co-infection with other pathogens not detected by the test. Clinical correlation with patient history and other diagnostic information is necessary to determine patient infection status. The expected  result is Negative.  Fact Sheet for Patients: BloggerCourse.com  Fact Sheet for Healthcare Providers: SeriousBroker.it  This test is not yet approved or cleared by the [place] FDA and  has been authorized for detection and/or diagnosis of SARS-CoV-2 by FDA under an Emergency Use Authorization (EUA).  This EUA will remain in effect (meaning this test can be used) for the duration of  the COVID-19 declaration under Section 564(b)(1) of the A                          ct, 21 U.S.C. section 360bbb-3(b)(1), unless the authorization is terminated or revoked sooner.     Influenza A by PCR 01/31/2021 NEGATIVE  NEGATIVE Final  Influenza B by PCR 01/31/2021 NEGATIVE  NEGATIVE Final   Comment: (NOTE) The Xpert Xpress SARS-CoV-2/FLU/RSV plus assay is intended as an aid in the diagnosis of influenza from Nasopharyngeal swab specimens and should not be used as a sole basis for treatment. Nasal washings and aspirates are unacceptable for Xpert Xpress SARS-CoV-2/FLU/RSV testing.  Fact Sheet for Patients: BloggerCourse.com  Fact Sheet for Healthcare Providers: SeriousBroker.it  This test is not yet approved or cleared by the [place] FDA and has been authorized for detection and/or diagnosis of SARS-CoV-2 by FDA under an Emergency Use Authorization (EUA). This EUA will remain in effect (meaning this test can be used) for the duration of the COVID-19 declaration under Section 564(b)(1) of the Act, 21 U.S.C. section 360bbb-3(b)(1), unless the authorization is terminated or revoked.  Performed at [facility], [address], [place], [place] [address]    WBC 01/31/2021 9.1  4.0 - 10.5 K/uL Final   RBC 01/31/2021 5.44  4.22 - 5.81 MIL/uL Final   Hemoglobin 01/31/2021 17.1 (H)  13.0 - 17.0 g/dL Final   HCT 60/45/4098 48.5  39.0 - 52.0 % Final   MCV 01/31/2021 89.2  80.0 -  100.0 fL Final   MCH 01/31/2021 31.4  26.0 - 34.0 pg Final   MCHC 01/31/2021 35.3  30.0 - 36.0 g/dL Final   RDW 11/91/4782 12.8  11.5 - 15.5 % Final   Platelets 01/31/2021 305  150 - 400 K/uL Final   nRBC 01/31/2021 0.0  0.0 - 0.2 % Final   Neutrophils Relative % 01/31/2021 77  % Final   Neutro Abs 01/31/2021 7.0  1.7 - 7.7 K/uL Final   Lymphocytes Relative 01/31/2021 14  % Final   Lymphs Abs 01/31/2021 1.2  0.7 - 4.0 K/uL Final   Monocytes Relative 01/31/2021 8  % Final   Monocytes Absolute 01/31/2021 0.8  0.1 - 1.0 K/uL Final   Eosinophils Relative 01/31/2021 0  % Final   Eosinophils Absolute 01/31/2021 0.0  0.0 - 0.5 K/uL Final   Basophils Relative 01/31/2021 1  % Final   Basophils Absolute 01/31/2021 0.1  0.0 - 0.1 K/uL Final   Immature Granulocytes 01/31/2021 0  % Final   Abs Immature Granulocytes 01/31/2021 0.03  0.00 - 0.07 K/uL Final   Performed at [facility], [address] [place], [place] [address]   Sodium 01/31/2021 135  135 - 145 mmol/L Final   Potassium 01/31/2021 3.9  3.5 - 5.1 mmol/L Final   Chloride 01/31/2021 96 (L)  98 - 111 mmol/L Final   CO2 01/31/2021 25  22 - 32 mmol/L Final   Glucose, Bld 01/31/2021 113 (H)  70 - 99 mg/dL Final   Glucose reference range applies only to samples taken after fasting for at least 8 hours.   BUN 01/31/2021 5 (L)  6 - 20 mg/dL Final   Creatinine, Ser 01/31/2021 0.71  0.61 - 1.24 mg/dL Final   Calcium 30/86/5784 9.4  8.9 - 10.3 mg/dL Final   Total Protein 69/62/9528 8.0  6.5 - 8.1 g/dL Final   Albumin 41/32/4401 4.5  3.5 - 5.0 g/dL Final   AST 02/72/5366 424 (H)  15 - 41 U/L Final   ALT 01/31/2021 342 (H)  0 - 44 U/L Final   Alkaline Phosphatase 01/31/2021 92  38 - 126 U/L Final   Total Bilirubin 01/31/2021 0.8  0.3 - 1.2 mg/dL Final   GFR, Estimated 01/31/2021 >60  >60 mL/min Final   Comment: (  NOTE) Calculated using the CKD-EPI Creatinine Equation (2021)    Anion gap 01/31/2021 14  5 - 15 Final   Performed at [facility], [address], [place], [place] [address]   Hgb A1c MFr Bld 01/31/2021 5.2  4.8 - 5.6 % Final   Comment: (NOTE) Pre diabetes:          5.7%-6.4%  Diabetes:              >6.4%  Glycemic control for   <7.0% adults with diabetes    Mean Plasma Glucose 01/31/2021 102.54  mg/dL Final   Performed at [facility], [address], [place], [place] [address]   Magnesium 01/31/2021 2.3  1.7 - 2.4 mg/dL Final   Performed at [facility], [address], [place], [place] [address]   Alcohol, Ethyl (B) 01/31/2021 254 (H)  <10 mg/dL Final   Comment: (NOTE) Lowest detectable limit for serum alcohol is 10 mg/dL.  For medical purposes only. Performed at [facility], [address], [place], [place] [address]    Cholesterol 01/31/2021 208 (H)  0 - 200 mg/dL Final   Triglycerides 29/56/2130 80  <150 mg/dL Final   HDL 86/57/8469 91  >40 mg/dL Final   Total CHOL/HDL Ratio 01/31/2021 2.3  RATIO Final   VLDL 01/31/2021 16  0 - 40 mg/dL Final   LDL Cholesterol 01/31/2021 101 (H)  0 - 99 mg/dL Final   Comment:        Total Cholesterol/HDL:CHD Risk Coronary Heart Disease Risk Table                     Men   Women  1/2 Average Risk   3.4   3.3  Average Risk       5.0   4.4  2 X Average Risk   9.6   7.1  3 X Average Risk  23.4   11.0        Use the calculated Patient Ratio above and the CHD Risk Table to determine the patient's CHD Risk.        ATP III CLASSIFICATION (LDL):  <100     mg/dL   Optimal  629-528  mg/dL   Near or Above                    Optimal  130-159  mg/dL   Borderline  413-244  mg/dL   High  >010     mg/dL   Very High Performed at [facility], [address], [place], [place] [address]    TSH 01/31/2021 0.569  0.350 - 4.500 uIU/mL Final   Comment: Performed by a 3rd Generation assay with a functional sensitivity of <=0.01 uIU/mL. Performed at [facility], [address] [place], [place] [address]    Prolactin 01/31/2021 12.0   4.0 - 15.2 ng/mL Final   Comment: (NOTE) Performed At: [facility] [address] [place], [place] [id number] Jolene Schimke MD LO:[id number]    POC Amphetamine UR 01/31/2021 None Detected  NONE DETECTED (Cut Off Level 1000 ng/mL) Final   POC Secobarbital (BAR) 01/31/2021 None Detected  NONE DETECTED (Cut Off Level 300 ng/mL) Final   POC Buprenorphine (BUP) 01/31/2021 None Detected  NONE DETECTED (Cut Off Level 10 ng/mL) Final   POC Oxazepam (BZO) 01/31/2021 None Detected  NONE DETECTED (Cut Off Level 300 ng/mL) Final   POC Cocaine UR 01/31/2021 None Detected  NONE DETECTED (Cut Off Level 300 ng/mL) Final   POC Methamphetamine UR 01/31/2021 None Detected  NONE DETECTED (Cut Off Level 1000 ng/mL) Final   POC Morphine 01/31/2021 None Detected  NONE DETECTED (Cut Off Level 300 ng/mL) Final   POC Oxycodone UR 01/31/2021 None Detected  NONE DETECTED (Cut Off Level 100 ng/mL) Final   POC Methadone UR 01/31/2021 None Detected  NONE DETECTED (Cut Off Level 300 ng/mL) Final   POC Marijuana UR 01/31/2021 Positive (A)  NONE DETECTED (Cut Off Level 50 ng/mL) Final   SARS Coronavirus 2 Ag 01/31/2021 Negative  Negative Final  Admission on 01/20/2021, Discharged on 01/21/2021  Component Date Value Ref Range Status   Sodium 01/20/2021 137  135 - 145 mmol/L Final   Potassium 01/20/2021 3.8  3.5 - 5.1 mmol/L Final   Chloride 01/20/2021 96 (L)  98 - 111 mmol/L Final   CO2 01/20/2021 26  22 - 32 mmol/L Final   Glucose, Bld 01/20/2021 105 (H)  70 - 99 mg/dL Final   Glucose reference range applies only to samples taken after fasting for at least 8 hours.   BUN 01/20/2021 7  6 - 20 mg/dL Final   Creatinine, Ser 01/20/2021 0.80  0.61 - 1.24 mg/dL Final   Calcium 16/10/960401/16/2023 9.0  8.9 - 10.3 mg/dL Final   Total Protein 54/09/811901/16/2023 8.8 (H)  6.5 - 8.1 g/dL Final   Albumin 14/78/295601/16/2023 4.8  3.5 - 5.0 g/dL Final   AST 21/30/865701/16/2023 41  15 - 41 U/L Final   ALT 01/20/2021 39  0 - 44 U/L Final   Alkaline  Phosphatase 01/20/2021 90  38 - 126 U/L Final   Total Bilirubin 01/20/2021 1.2  0.3 - 1.2 mg/dL Final   GFR, Estimated 01/20/2021 >60  >60 mL/min Final   Comment: (NOTE) Calculated using the CKD-EPI Creatinine Equation (2021)    Anion gap 01/20/2021 15  5 - 15 Final   Performed at [facility], [address] [place], [place] [address]   Alcohol, Ethyl (B) 01/20/2021 231 (H)  <10 mg/dL Final   Comment: (NOTE) Lowest detectable limit for serum alcohol is 10 mg/dL.  For medical purposes only. Performed at [facility], [address] [place], [place] [address]    Salicylate Lvl 01/20/2021 <7.0 (L)  7.0 - 30.0 mg/dL Final   Performed at [facility], [address] [place], [place] [address]   Acetaminophen (Tylenol), Serum 01/20/2021 <10 (L)  10 - 30 ug/mL Final   Comment: (NOTE) Therapeutic concentrations vary significantly. A range of 10-30 ug/mL  may be an effective concentration for many patients. However, some  are best treated at concentrations outside of this range. Acetaminophen concentrations >150 ug/mL at 4 hours after ingestion  and >50 ug/mL at 12 hours after ingestion are often associated with  toxic reactions.  Performed at [facility], [address] [place], [place] [address]    WBC 01/20/2021 11.3 (H)  4.0 - 10.5 K/uL Final   RBC 01/20/2021 5.57  4.22 - 5.81 MIL/uL Final   Hemoglobin 01/20/2021 17.7 (H)  13.0 - 17.0 g/dL Final   HCT 72/53/664401/16/2023 49.6  39.0 - 52.0 % Final   MCV 01/20/2021 89.0  80.0 - 100.0 fL Final   MCH 01/20/2021 31.8  26.0 - 34.0 pg Final   MCHC 01/20/2021 35.7  30.0 - 36.0 g/dL Final   RDW 03/47/425901/16/2023 12.6  11.5 - 15.5 % Final   Platelets 01/20/2021 400  150 - 400 K/uL Final   nRBC 01/20/2021 0.0  0.0 - 0.2 % Final   Performed at [facility], [address], [place], [place] [address]   Opiates 01/20/2021 NONE DETECTED  NONE DETECTED Final    Cocaine 01/20/2021 NONE DETECTED  NONE DETECTED Final   Benzodiazepines 01/20/2021 NONE DETECTED  NONE DETECTED Final   Amphetamines 01/20/2021 NONE DETECTED  NONE DETECTED Final   Tetrahydrocannabinol 01/20/2021 POSITIVE (A)  NONE DETECTED Final   Barbiturates 01/20/2021 NONE DETECTED  NONE DETECTED Final   Comment: (NOTE) DRUG SCREEN FOR MEDICAL PURPOSES ONLY.  IF CONFIRMATION IS NEEDED FOR ANY PURPOSE, NOTIFY LAB WITHIN 5 DAYS.  LOWEST DETECTABLE LIMITS FOR URINE DRUG SCREEN Drug Class                     Cutoff (ng/mL) Amphetamine and metabolites    1000 Barbiturate and metabolites    200 Benzodiazepine                 200 Tricyclics and metabolites     300 Opiates and metabolites        300 Cocaine and metabolites        300 THC                            50 Performed at [facility], [address] [place], [place] [address]    SARS Coronavirus 2 by RT PCR 01/21/2021 NEGATIVE  NEGATIVE Final   Comment: (NOTE) SARS-CoV-2 target nucleic acids are NOT DETECTED.  The SARS-CoV-2 RNA is generally detectable in upper respiratory specimens during the acute phase of infection. The lowest concentration of SARS-CoV-2 viral copies this assay can detect is 138 copies/mL. A negative result does not preclude SARS-Cov-2 infection and should not be used as the sole basis for treatment or other patient management decisions. A negative result may occur with  improper specimen collection/handling, submission of specimen other than nasopharyngeal swab, presence of viral mutation(s) within the areas targeted by this assay, and inadequate number of viral copies(<138 copies/mL). A negative result must be combined with clinical observations, patient history, and epidemiological information. The expected result is Negative.  Fact Sheet for Patients:  BloggerCourse.com  Fact Sheet for Healthcare Providers:   SeriousBroker.it  This test is no                          t yet approved or cleared by the [place] FDA and  has been authorized for detection and/or diagnosis of SARS-CoV-2 by FDA under an Emergency Use Authorization (EUA). This EUA will remain  in effect (meaning this test can be used) for the duration of the COVID-19 declaration under Section 564(b)(1) of the Act, 21 U.S.C.section 360bbb-3(b)(1), unless the authorization is terminated  or revoked sooner.       Influenza A by PCR 01/21/2021 NEGATIVE  NEGATIVE Final   Influenza B by PCR 01/21/2021 NEGATIVE  NEGATIVE Final   Comment: (NOTE) The Xpert Xpress SARS-CoV-2/FLU/RSV plus assay is intended as an aid in the diagnosis of influenza from Nasopharyngeal swab specimens and should not be used as a sole basis for treatment. Nasal washings and aspirates are unacceptable for Xpert Xpress SARS-CoV-2/FLU/RSV testing.  Fact Sheet for Patients: BloggerCourse.com  Fact Sheet for Healthcare Providers: SeriousBroker.it  This test is not yet approved or cleared by the [place] FDA and has been  authorized for detection and/or diagnosis of SARS-CoV-2 by FDA under an Emergency Use Authorization (EUA). This EUA will remain in effect (meaning this test can be used) for the duration of the COVID-19 declaration under Section 564(b)(1) of the Act, 21 U.S.C. section 360bbb-3(b)(1), unless the authorization is terminated or revoked.  Performed at [facility], [address] [place], [place] [address]     Blood Alcohol level:  Lab Results  Component Value Date   ETH 455 [facility]) 06/19/2021   ETH 374 (HH) 04/02/2021    Metabolic Disorder Labs: Lab Results  Component Value Date   HGBA1C 4.9 04/04/2021   MPG 93.93 04/04/2021   MPG 102.54 01/31/2021   Lab Results  Component Value Date   PROLACTIN 12.0 01/31/2021   Lab Results   Component Value Date   CHOL 208 (H) 01/31/2021   TRIG 80 01/31/2021   HDL 91 01/31/2021   CHOLHDL 2.3 01/31/2021   VLDL 16 01/31/2021   LDLCALC 101 (H) 01/31/2021   LDLCALC 144 (H) 11/18/2019    Therapeutic Lab Levels: No results found for: "LITHIUM" No results found for: "VALPROATE" No results found for: "CBMZ"  Physical Findings   AIMS    Flowsheet Row [facility]) [facility]) [facility]  AIMS Total Score 0 0      AUDIT    Flowsheet Row [facility]) [facility] Ad[facility]) [facility] Office Visit from 10/30/2019 in [facility]  Alcohol Use Disorder Identification Test Final Score (AUDIT) 36 40 33      CAGE-AID    Flowsheet Row Office Visit from 10/30/2019 in [facility]  CAGE-AID Score 4      GAD-7    Flowsheet Row [facility] Visit from 10/30/2019 in [facility]  Total GAD-7 Score 21      PHQ2-9    Flowsheet Row ED from 06/20/2021 in [facility] ED from 06/19/2021 in [facility] EM[facility]Y [facility] ED from 01/20/2021 in [facility] Office Visit from 10/30/2019 in [facility]  PHQ-2 Total Score PHQ-9 Total Score Flowsheet Row ED from 06/20/2021 in [facility] ED from 06/19/2021 in [facility] Ad[facility] ([facility]  C-SSRS RISK CATEGORY Low Risk Low Risk No Risk        Musculoskeletal  Strength & Muscle Tone: within normal limits Gait & Station:  normal Patient leans: N/A  Psychiatric Specialty Exam  Presentation  General Appearance: Disheveled  Eye Contact:Good  Speech:Clear and Coherent; Normal Rate  Speech Volume:Normal  Handedness:Right   Mood and Affect  Mood:Anxious  Affect:Congruent   Thought Process  Thought Processes:Coherent  Descriptions of Associations:Intact  Orientation:Full (Time, Place and Person)  Thought Content:Logical  Diagnosis of Schizophrenia or Schizoaffective disorder in past: No    Hallucinations:Hallucinations: None  Ideas of Reference:None  Suicidal Thoughts:Suicidal Thoughts: No  Homicidal Thoughts:Homicidal Thoughts: No   Sensorium  Memory:Immediate Good; Recent Good; Remote Good  Judgment:Fair  Insight:Fair   Executive Functions  Concentration:Good  Attention Span:Good  Recall:Good  Fund of Knowledge:Good  Language:Good   Psychomotor Activity  Psychomotor Activity:Psychomotor  Activity: Normal   Assets  Assets:Communication Skills; Desire for Improvement; Financial Resources/Insurance; Physical Health; Resilience; Leisure Time   Sleep  Sleep:Sleep: Fair   No data recorded  Physical Exam  Physical Exam Vitals and nursing note reviewed.  Constitutional:      General: He is not in acute distress.    Appearance: He is well-developed.  HENT:     Head: Normocephalic and atraumatic.  Eyes:     General:        Right eye: No discharge.        Left eye: No discharge.     Conjunctiva/sclera: Conjunctivae normal.  Cardiovascular:     Rate and Rhythm: Normal rate.  Pulmonary:     Effort: Pulmonary effort is normal. No respiratory distress.  Musculoskeletal:        General: No swelling. Normal range of motion.     Cervical back: Normal range of motion.  Skin:    General: Skin is warm and dry.     Capillary Refill: Capillary refill takes less than 2 seconds.  Neurological:     Mental Status: He is alert.  Psychiatric:        Attention and  Perception: Attention and perception normal.        Mood and Affect: Mood is depressed.        Speech: Speech normal.        Behavior: Behavior is cooperative.        Thought Content: Thought content normal.        Cognition and Memory: Cognition normal.        Judgment: Judgment normal.    Review of Systems  Constitutional: Negative.   HENT: Negative.    Eyes: Negative.   Respiratory: Negative.    Cardiovascular: Negative.   Musculoskeletal: Negative.   Skin: Negative.   Neurological: Negative.   Psychiatric/Behavioral:  Positive for depression. The patient is nervous/anxious.    Blood pressure 114/82, pulse 98, temperature 99.1 F (37.3 C), temperature source Tympanic, resp. rate 18, SpO2 100 %. There is no height or weight on file to calculate BMI.  Treatment Plan Summary:  Ongoing.  Ativan taper scheduled and Monday 06/23/2021.  Patient declines residential substance abuse treatment at this time.  We will continue to have daily contact with patient to assess and evaluate symptoms and progress in treatment and Medication management.   Ardis Hughs, NP 06/22/2021 2:30 PM

## 2021-06-22 NOTE — Progress Notes (Signed)
Patient spent all day in bed until he was strongly encouraged to come outside for fresh air and light.  Patient has remained out of bed since.  Patient asked for lunch and same given.  He is disheveled with simple thought process and slow speech.  Attempted to educate patient regarding etoh use and recovery process.  It was unclear if he was able to process concepts.  He has not experienced incontinence this shift.  Encouraged him to attend to Adl[facility].  He continues to have fine tremors however no other evidence of etoh withdrawal at this time.  He denies avh shi or plan.  Will monitor and provide safety while on unit.

## 2021-06-23 DIAGNOSIS — F10230 Alcohol dependence with withdrawal, uncomplicated: Secondary | ICD-10-CM | POA: Diagnosis not present

## 2021-06-23 DIAGNOSIS — F1014 Alcohol abuse with alcohol-induced mood disorder: Secondary | ICD-10-CM | POA: Diagnosis not present

## 2021-06-23 DIAGNOSIS — F332 Major depressive disorder, recurrent severe without psychotic features: Secondary | ICD-10-CM | POA: Diagnosis not present

## 2021-06-23 DIAGNOSIS — F10229 Alcohol dependence with intoxication, unspecified: Secondary | ICD-10-CM | POA: Diagnosis not present

## 2021-06-23 MED ORDER — GABAPENTIN 300 MG PO CAPS: 300.0000 mg | ORAL_CAPSULE | Freq: Three times a day (TID) | ORAL | 0 refills | Status: DC

## 2021-06-23 MED ORDER — QUETIAPINE FUMARATE 50 MG PO TABS
50.0000 mg | ORAL_TABLET | Freq: Every day | ORAL | 0 refills | Status: DC
Start: 2021-06-23 — End: 2021-08-16

## 2021-06-23 MED ORDER — THIAMINE HCL 100 MG PO TABS: 100.0000 mg | ORAL_TABLET | Freq: Every day | ORAL | 0 refills | Status: DC

## 2021-06-23 MED ORDER — SERTRALINE HCL 50 MG PO TABS
50.0000 mg | ORAL_TABLET | Freq: Every day | ORAL | 0 refills | Status: DC
Start: 2021-06-23 — End: 2021-08-16

## 2021-06-23 MED ORDER — TRAZODONE HCL 100 MG PO TABS
100.0000 mg | ORAL_TABLET | Freq: Every evening | ORAL | 0 refills | Status: DC | PRN
Start: 2021-06-23 — End: 2021-08-05

## 2021-06-23 MED ORDER — ADULT MULTIVITAMIN W/MINERALS CH: 1.0000 | ORAL_TABLET | Freq: Every day | ORAL | 0 refills | Status: DC

## 2021-06-23 NOTE — ED Notes (Signed)
Pt refused breakfast 

## 2021-06-23 NOTE — Discharge Instructions (Addendum)
Based on observations and presenting issue, resources for therapy for substance treatment, medication management, and shelter information have been provided for your convenience.  It is imperative to follow through with seeking treatment 5-7 from the day of discharge for continuity of care for your mental health as well as personal safety.  In case of an urgent emergency, you have the option of contacting the Mobile Crisis Unit with [facility] at [phone].  OUTPATIENT SERVICES       [facility]      [address], SECOND FLOOR      [place], [place] [address]      [phone]       They offer psychiatry/medication management and therapy.  New patients are seen in their walk-in clinic.  Walk-in hours are Monday, Wednesday, Thursday and Friday from 8:00 am - 11:00 am for psychiatry, and Monday and Wednesday from 8:00 am - 11:00 am for therapy.  Walk-in patients are seen on a first come, first served basis, so try to arrive as early as possible for the best chance of being seen the same day.  BE SURE TO TAKE THE ELEVATOR TO THE SECOND FLOOR.  Please note that to be eligible for services you must bring an ID or a piece of mail with your name and a [facility] address. SUBSTANCE USE TREATMENT for Medicaid and State Funded/IPRS  [facility] [address] [place], [place], [address] [phone] phone NOTE: [facility] is no longer offering IOP services.  Serves those who are low-income or have no insurance.  [facility] [address] [place], [place], [address] [phone] phone [phone] fax NOTE: Does have Substance Abuse-Intensive Outpatient Program [facility]) as well as transitional housing if eligible.  [facility] [address] [place], [place], [address] [phone] phone [phone] fax  [facility] [phone] [place], [place], [address] [phone] phone [phone] fax   CHARITABLE  RESIDENTIAL REHABS  [facility] [address] [place], [place], [address] [phone] phone  They offer transportation to the facility if you do not have any.  No antipsychotic medications allowed.  [facility] (men only at this campus) [address] [place], [place] [address] [phone]  ((These programs listed above have a one-time application fee.))   [address], [place] [address] [phone]  [facility] [phone] [address] [place] [address] [phone] [address] [place] [address] [place], [place] [address] [phone] [address], [place] [address] [phone]  [facility] (Rehab for men only) [address], [place], [place] [address] [phone]   MEDICAL DETOX/RESIDENTIAL TREATMENT- MEDICAID/IPRS:  [facility] [address] [place], [place] [address] [phone]  [facility] [address] [place], [place] [address] [phone]  [facility] [address] [place], [place] [address] [phone]  [facility] [address] [place], [place] [address] [phone]  [facility] [address] [place], [place] [address] [phone]  ((For admissions to these three [facility] facilities during weekday days and possibly other times, contact [facility] [place], phone: [phone]; fax: [phone]))  [facility] (detox for men and women) [address] [place], [place] [address] [phone]   HALFWAY HOUSES:  [facility] [phone]  [employer].oxfordvacancies.com   12 STEP PROGRAMS:  Alcoholics Anonymous of [place]  SoftwareChalet.be  Narcotics Anonymous of [place] [employer]ct.dk  Al-Anon of [place], [place]  www.[place]alanon.org/find-meetings.html  Nar-Anon https://nar-anon.org/find-a-meetin    EMERGENCY [facility]  [facility] [address] [place], [place] [address] [phone] Population served: Adult men & women (18 years old and older, able to perform activities for daily living) Documents required: Valid ID & Social Security Card  [facility] [address] [place], [place] [address] [phone] Population served: Males 18+ Documents required: Valid ID & Social Security Card  [facility]  [address], [place], [place] [address] [phone] Population served: Single adults and families with children Documents required: Valid ID & another form of identification  [employer] [employer] [address], [place], [place] [address] [phone] Population Served: Families with children, adult women, and adult men.  [facility] [address], [place], [place] [address] [phone] Population served: Men 18+, preference for disabled and/or veterans Eligibility: By referral only   Take all medications as prescribed by his/her mental healthcare provider. Report any adverse effects and or reactions from the medicines to your outpatient provider promptly. Do not engage in alcohol and or illegal drug use while on prescription medicines. In the event of worsening symptoms, call the crisis hotline, 911 and or go to the nearest ED for appropriate evaluation and treatment of symptoms. follow-up with your primary care provider for your other medical issues, concerns and or health care needs.  Refills will not be given by the physician you saw on this admission as this physician is not your regular outpatient provider. Please follow up with your outpatient provider for refills. If you do  not have a psychiatrist or psychiatry provider, you may ask your primary care  provider for assistance with refills

## 2021-06-23 NOTE — ED Notes (Signed)
Patient continues to rest with no sxs of discomfort - will continue to monitor for safety 

## 2021-06-23 NOTE — Progress Notes (Signed)
Received [place] this AM in his bed, he woke up, refused breakfast, but was compliant with his AM medications. Stated he feels better today compared to yesterday. He endorsed feeling anxious and depressed with concerns about going home with the fear of drinking. He is calm and made eye contact. He made a phone call.

## 2021-06-23 NOTE — BH Assessment (Signed)
LCSW Progress Note  Per Estella Husk, MD, this pt does not require psychiatric hospitalization at this time.  Pt is psychiatrically cleared.  Discharge instructions include several resources for substance use treatment, medication management, therapy, SA rehab facilities, and shelters.  EDP Estella Husk, MD, has been notified.  Hansel Starling, MSW, LCSW [facility] [phone] or [phone]

## 2021-06-23 NOTE — ED Notes (Signed)
Did not attend group 
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
## 2021-06-23 NOTE — Progress Notes (Signed)
Thomas [name] completed the survey and the PHQ9. He received his AVS, questions answeredHe received his prescriptions and medication samples. He retrieved his personal belongings and was escorted to the lobby to wait for his ride.

## 2021-06-30 ENCOUNTER — Other Ambulatory Visit (HOSPITAL_COMMUNITY): Payer: Self-pay

## 2021-08-05 ENCOUNTER — Other Ambulatory Visit: Payer: Self-pay

## 2021-08-05 ENCOUNTER — Emergency Department (HOSPITAL_COMMUNITY)
Admission: EM | Admit: 2021-08-05 | Discharge: 2021-08-06 | Disposition: A | Discharge status: Home / Self Care | Payer: Self-pay | Attending: Emergency Medicine | Admitting: Emergency Medicine

## 2021-08-05 ENCOUNTER — Encounter (HOSPITAL_COMMUNITY): Payer: Self-pay

## 2021-08-05 DIAGNOSIS — R45851 Suicidal ideations: Secondary | ICD-10-CM | POA: Insufficient documentation

## 2021-08-05 DIAGNOSIS — F1020 Alcohol dependence, uncomplicated: Secondary | ICD-10-CM

## 2021-08-05 DIAGNOSIS — F332 Major depressive disorder, recurrent severe without psychotic features: Secondary | ICD-10-CM | POA: Insufficient documentation

## 2021-08-05 DIAGNOSIS — Z20822 Contact with and (suspected) exposure to covid-19: Secondary | ICD-10-CM | POA: Insufficient documentation

## 2021-08-05 DIAGNOSIS — F10920 Alcohol use, unspecified with intoxication, uncomplicated: Secondary | ICD-10-CM | POA: Insufficient documentation

## 2021-08-05 DIAGNOSIS — Y908 Blood alcohol level of 240 mg/100 ml or more: Secondary | ICD-10-CM | POA: Insufficient documentation

## 2021-08-05 DIAGNOSIS — R000 Tachycardia, unspecified: Secondary | ICD-10-CM | POA: Insufficient documentation

## 2021-08-05 LAB — COMPREHENSIVE METABOLIC PANEL
ALT: 41 U/L (ref 0–44)
AST: 36 U/L (ref 15–41)
Albumin: 4.3 g/dL (ref 3.5–5.0)
Alkaline Phosphatase: 91 U/L (ref 38–126)
Anion gap: 16 — ABNORMAL HIGH (ref 5–15)
BUN: 8 mg/dL (ref 6–20)
CO2: 23 mmol/L (ref 22–32)
Calcium: 8.5 mg/dL — ABNORMAL LOW (ref 8.9–10.3)
Chloride: 102 mmol/L (ref 98–111)
Creatinine, Ser: 0.71 mg/dL (ref 0.61–1.24)
GFR, Estimated: >60 mL/min (ref >60)
Glucose, Bld: 106 mg/dL — ABNORMAL HIGH (ref 70–99)
Potassium: 3.7 mmol/L (ref 3.5–5.1)
Sodium: 141 mmol/L (ref 135–145)
Total Bilirubin: 0.6 mg/dL (ref 0.3–1.2)
Total Protein: 8.1 g/dL (ref 6.5–8.1)

## 2021-08-05 LAB — CBC WITH DIFFERENTIAL/PLATELET
Abs Immature Granulocytes: 0.15 10*3/uL — ABNORMAL HIGH (ref 0.00–0.07)
Basophils Absolute: 0.1 10*3/uL (ref 0.0–0.1)
Basophils Relative: 1 %
Eosinophils Absolute: 0.1 10*3/uL (ref 0.0–0.5)
Eosinophils Relative: 1 %
HCT: 46.4 % (ref 39.0–52.0)
Hemoglobin: 16.5 g/dL (ref 13.0–17.0)
Immature Granulocytes: 2 %
Lymphocytes Relative: 32 %
Lymphs Abs: 2.9 10*3/uL (ref 0.7–4.0)
MCH: 32.9 pg (ref 26.0–34.0)
MCHC: 35.6 g/dL (ref 30.0–36.0)
MCV: 92.6 fL (ref 80.0–100.0)
Monocytes Absolute: 0.5 10*3/uL (ref 0.1–1.0)
Monocytes Relative: 5 %
Neutro Abs: 5.4 10*3/uL (ref 1.7–7.7)
Neutrophils Relative %: 59 %
Platelets: 389 10*3/uL (ref 150–400)
RBC: 5.01 MIL/uL (ref 4.22–5.81)
RDW: 13.1 % (ref 11.5–15.5)
WBC: 9.2 10*3/uL (ref 4.0–10.5)
nRBC: 0 % (ref 0.0–0.2)

## 2021-08-05 LAB — RESP PANEL BY RT-PCR (FLU A&B, COVID) ARPGX2
Influenza A by PCR: NEGATIVE
Influenza B by PCR: NEGATIVE
SARS Coronavirus 2 by RT PCR: NEGATIVE

## 2021-08-05 LAB — ETHANOL: Alcohol, Ethyl (B): 321 mg/dL (ref <10)

## 2021-08-05 MED ORDER — THIAMINE HCL 100 MG/ML IJ SOLN: 100.0000 mg | Freq: Every day | INTRAMUSCULAR | Status: DC

## 2021-08-05 MED ORDER — LORAZEPAM 1 MG PO TABS
0.0000 mg | ORAL_TABLET | Freq: Four times a day (QID) | ORAL | Status: DC
  Administered 2021-08-05: 2 mg via ORAL
  Administered 2021-08-05 – 2021-08-06 (×2): 1 mg via ORAL
  Administered 2021-08-06: 2 mg via ORAL
  Filled 2021-08-05: qty 2
  Filled 2021-08-05 (×2): qty 1
  Filled 2021-08-05: qty 2

## 2021-08-05 MED ORDER — LORAZEPAM 2 MG/ML IJ SOLN: 0.0000 mg | Freq: Four times a day (QID) | INTRAMUSCULAR | Status: DC

## 2021-08-05 MED ORDER — HYDROXYZINE HCL 25 MG PO TABS
50.0000 mg | ORAL_TABLET | Freq: Four times a day (QID) | ORAL | Status: DC | PRN
  Administered 2021-08-05 – 2021-08-06 (×2): 50 mg via ORAL
  Filled 2021-08-05 (×2): qty 2

## 2021-08-05 MED ORDER — LORAZEPAM 1 MG PO TABS: 0.0000 mg | ORAL_TABLET | Freq: Two times a day (BID) | ORAL | Status: DC

## 2021-08-05 MED ORDER — SODIUM CHLORIDE 0.9 % IV BOLUS
500.0000 mL | Freq: Once | INTRAVENOUS | Status: AC
  Administered 2021-08-05: 500 mL via INTRAVENOUS

## 2021-08-05 MED ORDER — THIAMINE HCL 100 MG PO TABS
100.0000 mg | ORAL_TABLET | Freq: Every day | ORAL | Status: DC
  Administered 2021-08-05 – 2021-08-06 (×2): 100 mg via ORAL
  Filled 2021-08-05 (×2): qty 1

## 2021-08-05 MED ORDER — LORAZEPAM 2 MG/ML IJ SOLN: 0.0000 mg | Freq: Two times a day (BID) | INTRAMUSCULAR | Status: DC

## 2021-08-05 NOTE — ED Notes (Signed)
Patient belongings in cabinet in TRIAGE closest to clean supply staff door.  Just has clothes in bag, pockets were not checked.

## 2021-08-05 NOTE — ED Provider Notes (Signed)
[facility]-EMERGENCY DEPT Provider Note   CSN: [id number] Arrival date & time: 08/05/21  1439     History  No chief complaint on file.   Thomas A [name] is a 31 y.o. male.  HPI He presents for evaluation of suicidal ideation, heavy alcohol use and poor hygiene.  He lives with his mother who petitioned him.  He is a vague historian.  He states he is "seeing a bunch of doctors but they do not help me."  He is unable to elaborate or unwilling.  When I asked him if he has any problems he says he has been sick but cannot specify what type of sickness he has had.  According to the officer that brought him in under IVC, he was not a problem with this transfer.  Patient apparently lives with his mother.  He reportedly drinks 2 cases of beer a day.  He told his mother that he was going to shoot himself with a gun.    Home Medications Prior to Admission medications   Medication Sig Start Date End Date Taking? Authorizing Provider  sertraline (ZOLOFT) 50 MG tablet Take 1 tablet (50 mg total) by mouth daily. 06/23/21  Yes Estella Husk, MD  gabapentin (NEURONTIN) 300 MG capsule Take 1 capsule (300 mg total) by mouth 3 (three) times daily. 06/23/21 07/23/21  Estella Husk, MD  Multiple Vitamin (MULTIVITAMIN WITH MINERALS) TABS tablet Take 1 tablet by mouth daily. Patient not taking: Reported on 08/05/2021 06/24/21   Estella Husk, MD  QUEtiapine (SEROQUEL) 50 MG tablet Take 1 tablet (50 mg total) by mouth at bedtime. Patient not taking: Reported on 08/05/2021 06/23/21   Estella Husk, MD      Allergies    Patient has no known allergies.    Review of Systems   Review of Systems  Physical Exam Updated Vital Signs BP 108/61   Pulse 88   Temp 98.9 F (37.2 C)   Resp 18   Ht 6' (1.829 m)   Wt 71 kg   SpO2 96%   BMI 21.23 kg/m  Physical Exam Vitals and nursing note reviewed.  Constitutional:      General: He is in acute distress (He appears  intoxicated).     Appearance: He is well-developed. He is not ill-appearing.     Comments: Disheveled, poor hygiene  HENT:     Head: Normocephalic and atraumatic.     Right Ear: External ear normal.     Left Ear: External ear normal.  Eyes:     Conjunctiva/sclera: Conjunctivae normal.     Pupils: Pupils are equal, round, and reactive to light.  Neck:     Trachea: Phonation normal.  Cardiovascular:     Rate and Rhythm: Tachycardia present.  Pulmonary:     Effort: Pulmonary effort is normal. No respiratory distress.     Breath sounds: Normal breath sounds. No stridor.  Abdominal:     General: There is no distension.     Palpations: Abdomen is soft.  Musculoskeletal:        General: No swelling. Normal range of motion.     Cervical back: Normal range of motion and neck supple.  Skin:    General: Skin is warm and dry.     Coloration: Skin is not jaundiced or pale.  Neurological:     Mental Status: He is alert and oriented to person, place, and time.     Cranial Nerves: No cranial nerve deficit.  Motor: No abnormal muscle tone.     Coordination: Coordination normal.     ED Results / Procedures / Treatments   Labs (all labs ordered are listed, but only abnormal results are displayed) Labs Reviewed  ETHANOL - Abnormal; Notable for the following components:      Result Value   Alcohol, Ethyl (B) 321 (*)    All other components within normal limits  CBC WITH DIFFERENTIAL/PLATELET - Abnormal; Notable for the following components:   Abs Immature Granulocytes 0.15 (*)    All other components within normal limits  COMPREHENSIVE METABOLIC PANEL - Abnormal; Notable for the following components:   Glucose, Bld 106 (*)    Calcium 8.5 (*)    Anion gap 16 (*)    All other components within normal limits  RESP PANEL BY RT-PCR (FLU A&B, COVID) [id number]  CBC WITH DIFFERENTIAL/PLATELET    EKG EKG Interpretation  Date/Time:  Tuesday August 05 2021 17:04:44 EDT Ventricular Rate:   99 PR Interval:  117 QRS Duration: 84 QT Interval:  360 QTC Calculation: 462 R Axis:   79 Text Interpretation: Sinus rhythm RSR' in V1 or V2, right VCD or RVH since last tracing no significant change Confirmed by Mancel Bale [phone]) on 08/05/2021 7:28:51 PM  Radiology No results found.  Procedures Procedures    Medications Ordered in ED Medications  LORazepam (ATIVAN) injection 0-4 mg ( Intravenous See Alternative 08/05/21 2136)    Or  LORazepam (ATIVAN) tablet 0-4 mg (1 mg Oral Given 08/05/21 2136)  LORazepam (ATIVAN) injection 0-4 mg (has no administration in time range)    Or  LORazepam (ATIVAN) tablet 0-4 mg (has no administration in time range)  thiamine (VITAMIN B1) tablet 100 mg (100 mg Oral Given 08/05/21 1629)    Or  thiamine (VITAMIN B1) injection 100 mg ( Intravenous See Alternative 08/05/21 1629)  hydrOXYzine (ATARAX) tablet 50 mg (50 mg Oral Given 08/05/21 1717)  sodium chloride 0.9 % bolus 500 mL (0 mLs Intravenous Stopped 08/05/21 1707)    ED Course/ Medical Decision Making/ A&P Clinical Course as of 08/05/21 2213  Tue Aug 05, 2021  1928 At this point the patient is medically cleared for treatment psychiatry. [EW]    Clinical Course User Index [EW] Mancel Bale, MD                           Medical Decision Making Patient presenting with a parent intoxication by alcohol, and reported suicide.  He is under IVC, petition by his mother.  I will uphold the petition and initiate screening evaluation and then contact TTS/psychiatry after medical clearance which I anticipate will occur.  On arrival he is not showing any signs of alcohol withdrawal, indication that he plans on eloping or acute medical illness.  He does appear tachycardic so we will give IV fluids.  I suspect that he is dehydrated or malnourished.  Amount and/or Complexity of Data Reviewed External Data Reviewed: notes.    Details: He was hospitalized at a psychiatric facility about a month and a half ago  for 3 days.  He was diagnosed with major depression.  During the visit he was treated for mild alcohol withdrawal syndrome using an Ativan taper.  This was successful he was discharged on multiple medications which he is not currently taking. Labs: ordered.    Details: CBC, metabolic panel, alcohol level -- normal except alcohol level high at 321, glucose high, calcium low, anion gap elevated  Risk OTC drugs. Prescription drug management. Decision regarding hospitalization. Risk Details: Patient presenting for evaluation of suicidal thoughts, and alcoholism with acute alcohol intoxication.  Patient presents under involuntary commitment and the petition was upheld.  Patient was medically cleared in the ED.  Psychiatry services consulted to evaluate and possibly admit the patient.  He does not require medical hospitalization at this time.           Final Clinical Impression(s) / ED Diagnoses Final diagnoses:  Alcoholism (HCC)  Suicidal ideation  Alcoholic intoxication without complication [facility])    Rx / DC Orders ED Discharge Orders     None         Mancel Bale, MD 08/05/21 2213

## 2021-08-05 NOTE — ED Notes (Signed)
Pt currently sleeping. Even rise and fall of chest noted.

## 2021-08-05 NOTE — ED Triage Notes (Signed)
Patient BIB [facility]. Patient stating SI . Patient drinks 22 beers daily and refuses to eat or drink anything else.

## 2021-08-06 DIAGNOSIS — F10920 Alcohol use, unspecified with intoxication, uncomplicated: Secondary | ICD-10-CM

## 2021-08-06 NOTE — Discharge Instructions (Addendum)
For your behavioral health needs you are advised to follow up with [facility] at your earliest opportunity:      [facility]      [address], SECOND FLOOR      [place], [place] [address]      [phone]      They offer psychiatry/medication management and therapy.  New patients are seen in their walk-in clinic.  Walk-in hours are Monday, Wednesday, Thursday and Friday from 8:00 am - 11:00 am for psychiatry, and Monday and Wednesday from 8:00 am - 11:00 am for therapy.  Walk-in patients are seen on a first come, first served basis, so try to arrive as early as possible for the best chance of being seen the same day.  BE SURE TO TAKE THE ELEVATOR TO THE SECOND FLOOR.  Please note that to be eligible for services you must bring an ID or a piece of mail with your name and a [facility] address.  For your shelter needs, contact the following service providers:       [facility] (operated by [facility])      [address] [place], [place] [address]      [phone]       [facility]      [address]      [place], [place] [address]      [phone]  For day shelter and other supportive services for the homeless, contact the [facility]):       [facility]      [address]      [place], [place] [address]      [phone]  For transitional housing, Pension scheme manager.  They provide longer term housing than a shelter, but there is an application process:       [facility]iday representative of [employer] [facility] [place]      [address], [place] [address]      [phone]

## 2021-08-06 NOTE — BH Assessment (Signed)
[facility] Assessment Progress Note   Per Hillery Jacks, NP, this pt does not require psychiatric hospitalization at this time.  Pt presents under IVC initiated by pt's mother and upheld by EDP Mancel Bale, MD, which has been rescinded by [place]  Pt is psychiatrically cleared.  Discharge instructions include referral information [facility], along with area supportive services for the homeless, however, pt was discharged before receiving them.  EDP Chaney Malling, MD and pt's nurse, Duwayne Heck, have been notified.  Doylene Canning, MA Triage Specialist [phone]

## 2021-08-06 NOTE — Consult Note (Addendum)
[facility] ED ASSESSMENT   Reason for Consult:  Anxiety and Depression Referring Physician:  EPD Patient Identification: Thomas [name] MRN:  [id number] ED Chief Complaint: MDD (major depressive disorder), recurrent episode, severe ([place])  Diagnosis:  Principal Problem:   MDD (major depressive disorder), recurrent episode, severe [facility])   ED Assessment Time Calculation: Start Time: 1406 Stop Time: 1425   Subjective:   Thomas [name] is a 31 y.o. male was seen and evaluated face-to-face.  He reports worsening panic attack and anxiety due to a recent job loss.  States he was employed by [employer] but does not feel filled in this workplace setting.  Currently he is denying suicidal or homicidal ideations.  Denies auditory visual hallucinations.  He reports marijuana and alcohol use to "numb the pain" related to his depression.   NP spoke to patient's mother Eris Halteman regarding any safety concerns with patient discharging home.  She denied that patient has access to guns or weapons.  States that he is not able to return home " we have too many problems." Mother reported patient urinate and defecates on hisself with substance abuse use with alcohol.  States he is incoherent and belligerent when he drinks.  Mother is requesting long-term residential treatment facility for his alcohol use. She reported " I have IVC'd so many times."   Discussed patient to follow-up with additional outpatient resources for alcohol treatment facilities and keep all outpatient follow-up with primary care provider/psychiatrist.  Case management to make additional outpatient resources available for homeless shelters.  Support encouragement reassurance was provided.  During evaluation Thomas [name] is siting in no acute distress. He is alert/oriented x 4; calm/cooperative; and mood congruent with affect. He is speaking in a clear tone at moderate volume, and normal pace; with good eye contact.  his thought process is  coherent and relevant; There is no indication that he is currently responding to internal/external stimuli or experiencing delusional thought content; and he has denied suicidal/self-harm/homicidal ideation, psychosis, and paranoia.   Patient has remained calm throughout assessment and has answered questions appropriately.     At this time Thomas [name] is educated and verbalizes understanding of mental health resources and other crisis services in the community. He is instructed to call 911 and present to the nearest emergency room should he experience any suicidal/homicidal ideation, auditory/visual/hallucinations, or detrimental worsening of his mental health condition. He was a also advised by Probation officer that he could call the toll-free phone on insurance card to assist with identifying in network counselors and agencies or number on back of Medicaid card to speak with care coordinator.     HPI:    Past Psychiatric History:   Risk to Self or Others: Is the patient at risk to self? No Has the patient been a risk to self in the past 6 months? No Has the patient been a risk to self within the distant past? No Is the patient a risk to others? No Has the patient been a risk to others in the past 6 months? No Has the patient been a risk to others within the distant past? No  [place] Scale:  [place] ED from 08/05/2021 in [place] DEPT ED from 06/20/2021 in [facility] ED from 06/19/2021 in [place] CATEGORY High Risk Low Risk Low Risk       AIMS:  , , ,  ,   ASAM:  Substance Abuse:     Past Medical History:  Past Medical History:  Diagnosis Date   Anxiety     Past Surgical History:  Procedure Laterality Date   APPENDECTOMY     Family History: History reviewed. No pertinent family history. Family Psychiatric  History:  Social History:  Social History    Substance and Sexual Activity  Alcohol Use Yes   Alcohol/week: 140.0 standard drinks of alcohol   Types: 140 Cans of beer per week   Comment: reported 20+ beers/day     Social History   Substance and Sexual Activity  Drug Use No    Social History   Socioeconomic History   Marital status: Single    Spouse name: Not on file   Number of children: Not on file   Years of education: Not on file   Highest education level: Not on file  Occupational History   Not on file  Tobacco Use   Smoking status: Never   Smokeless tobacco: Never  Vaping Use   Vaping Use: Never used  Substance and Sexual Activity   Alcohol use: Yes    Alcohol/week: 140.0 standard drinks of alcohol    Types: 140 Cans of beer per week    Comment: reported 20+ beers/day   Drug use: No   Sexual activity: Yes  Other Topics Concern   Not on file  Social History Narrative   Not on file   Social Determinants of Health   Financial Resource Strain: Not on file  Food Insecurity: Not on file  Transportation Needs: Not on file  Physical Activity: Not on file  Stress: Not on file  Social Connections: Not on file   Additional Social History:    Allergies:  No Known Allergies  Labs:  Results for orders placed or performed during the hospital encounter of 08/05/21 (from the past 48 hour(s))  Resp Panel by RT-PCR (Flu A&B, Covid) Anterior Nasal Swab     Status: None   Collection Time: 08/05/21  4:47 PM   Specimen: Anterior Nasal Swab  Result Value Ref Range   SARS Coronavirus 2 by RT PCR NEGATIVE NEGATIVE    Comment: (NOTE) SARS-CoV-2 target nucleic acids are NOT DETECTED.  The SARS-CoV-2 RNA is generally detectable in upper respiratory specimens during the acute phase of infection. The lowest concentration of SARS-CoV-2 viral copies this assay can detect is 138 copies/mL. A negative result does not preclude SARS-Cov-2 infection and should not be used as the sole basis for treatment or other patient  management decisions. A negative result may occur with  improper specimen collection/handling, submission of specimen other than nasopharyngeal swab, presence of viral mutation(s) within the areas targeted by this assay, and inadequate number of viral copies(<138 copies/mL). A negative result must be combined with clinical observations, patient history, and epidemiological information. The expected result is Negative.  Fact Sheet for Patients:  EntrepreneurPulse.com.au  Fact Sheet for Healthcare Providers:  IncredibleEmployment.be  This test is no t yet approved or cleared by the [place] FDA and  has been authorized for detection and/or diagnosis of SARS-CoV-2 by FDA under an Emergency Use Authorization (EUA). This EUA will remain  in effect (meaning this test can be used) for the duration of the COVID-19 declaration under Section 564(b)(1) of the Act, 21 U.S.C.section 360bbb-3(b)(1), unless the authorization is terminated  or revoked sooner.       Influenza A by PCR NEGATIVE NEGATIVE   Influenza B by PCR NEGATIVE NEGATIVE    Comment: (  NOTE) The Xpert Xpress SARS-CoV-2/FLU/RSV plus assay is intended as an aid in the diagnosis of influenza from Nasopharyngeal swab specimens and should not be used as a sole basis for treatment. Nasal washings and aspirates are unacceptable for Xpert Xpress SARS-CoV-2/FLU/RSV testing.  Fact Sheet for Patients: EntrepreneurPulse.com.au  Fact Sheet for Healthcare Providers: IncredibleEmployment.be  This test is not yet approved or cleared by the [place] FDA and has been authorized for detection and/or diagnosis of SARS-CoV-2 by FDA under an Emergency Use Authorization (EUA). This EUA will remain in effect (meaning this test can be used) for the duration of the COVID-19 declaration under Section 564(b)(1) of the Act, 21 U.S.C. section 360bbb-3(b)(1), unless the  authorization is terminated or revoked.  Performed at [facility], [place] [address] [place], [place] [address]   Ethanol     Status: Abnormal   Collection Time: 08/05/21  5:00 PM  Result Value Ref Range   Alcohol, Ethyl (B) 321 (HH) <10 mg/dL    Comment: CRITICAL RESULT CALLED TO, READ BACK BY AND VERIFIED WITH DOWD P. RN @1748  ON 08/05/21 BY KERLANDIA C. (NOTE) Lowest detectable limit for serum alcohol is 10 mg/dL.  For medical purposes only. Performed at [facility], [place] [address] [place], [place] [address]   CBC with Differential/Platelet     Status: Abnormal   Collection Time: 08/05/21  5:00 PM  Result Value Ref Range   WBC 9.2 4.0 - 10.5 K/uL   RBC 5.01 4.22 - 5.81 MIL/uL   Hemoglobin 16.5 13.0 - 17.0 g/dL   HCT 46.4 39.0 - 52.0 %   MCV 92.6 80.0 - 100.0 fL   MCH 32.9 26.0 - 34.0 pg   MCHC 35.6 30.0 - 36.0 g/dL   RDW 13.1 11.5 - 15.5 %   Platelets 389 150 - 400 K/uL   nRBC 0.0 0.0 - 0.2 %   Neutrophils Relative % 59 %   Neutro Abs 5.4 1.7 - 7.7 K/uL   Lymphocytes Relative 32 %   Lymphs Abs 2.9 0.7 - 4.0 K/uL   Monocytes Relative 5 %   Monocytes Absolute 0.5 0.1 - 1.0 K/uL   Eosinophils Relative 1 %   Eosinophils Absolute 0.1 0.0 - 0.5 K/uL   Basophils Relative 1 %   Basophils Absolute 0.1 0.0 - 0.1 K/uL   Immature Granulocytes 2 %   Abs Immature Granulocytes 0.15 (H) 0.00 - 0.07 K/uL    Comment: Performed at [facility], [place] [address], [place], [place] [address]  Comprehensive metabolic panel     Status: Abnormal   Collection Time: 08/05/21  5:00 PM  Result Value Ref Range   Sodium 141 135 - 145 mmol/L   Potassium 3.7 3.5 - 5.1 mmol/L   Chloride 102 98 - 111 mmol/L   CO2 23 22 - 32 mmol/L   Glucose, Bld 106 (H) 70 - 99 mg/dL    Comment: Glucose reference range applies only to samples taken after fasting for at least 8 hours.   BUN 8 6 - 20 mg/dL   Creatinine, Ser 0.71 0.61 - 1.24 mg/dL   Calcium  8.5 (L) 8.9 - 10.3 mg/dL   Total Protein 8.1 6.5 - 8.1 g/dL   Albumin 4.3 3.5 - 5.0 g/dL   AST 36 15 - 41 U/L   ALT 41 0 - 44 U/L   Alkaline Phosphatase 91 38 - 126 U/L   Total Bilirubin 0.6 0.3 - 1.2 mg/dL   GFR,  Estimated >60 >60 mL/min    Comment: (NOTE) Calculated using the CKD-EPI Creatinine Equation (2021)    Anion gap 16 (H) 5 - 15    Comment: Performed at [facility], [address] [place], [place] [address]    Current Facility-Administered Medications  Medication Dose Route Frequency Provider Last Rate Last Admin   hydrOXYzine (ATARAX) tablet 50 mg  50 mg Oral Q6H PRN Mancel Bale, MD   50 mg at 08/06/21 0448   LORazepam (ATIVAN) injection 0-4 mg  0-4 mg Intravenous Q6H Mancel Bale, MD       Or   LORazepam (ATIVAN) tablet 0-4 mg  0-4 mg Oral Q6H Mancel Bale, MD   2 mg at 08/06/21 7124   [START ON 08/08/2021] LORazepam (ATIVAN) injection 0-4 mg  0-4 mg Intravenous Q12H Mancel Bale, MD       Or   Melene Muller ON 08/08/2021] LORazepam (ATIVAN) tablet 0-4 mg  0-4 mg Oral Q12H Mancel Bale, MD       thiamine (VITAMIN B1) tablet 100 mg  100 mg Oral Daily Mancel Bale, MD   100 mg at 08/06/21 5809   Or   thiamine (VITAMIN B1) injection 100 mg  100 mg Intravenous Daily Mancel Bale, MD       Current Outpatient Medications  Medication Sig Dispense Refill   sertraline (ZOLOFT) 50 MG tablet Take 1 tablet (50 mg total) by mouth daily. 30 tablet 0   gabapentin (NEURONTIN) 300 MG capsule Take 1 capsule (300 mg total) by mouth 3 (three) times daily. 90 capsule 0   Multiple Vitamin (MULTIVITAMIN WITH MINERALS) TABS tablet Take 1 tablet by mouth daily. (Patient not taking: Reported on 08/05/2021) 30 tablet 0   QUEtiapine (SEROQUEL) 50 MG tablet Take 1 tablet (50 mg total) by mouth at bedtime. (Patient not taking: Reported on 08/05/2021) 30 tablet 0    Musculoskeletal: Strength & Muscle Tone: within normal limits Gait & Station: normal Patient leans:  N/A   Psychiatric Specialty Exam: Presentation  General Appearance: Appropriate for Environment; Casual  Eye Contact:Good  Speech:Clear and Coherent; Normal Rate  Speech Volume:Normal  Handedness:Right   Mood and Affect  Mood:-- ("alright")  Affect:Appropriate; Congruent   Thought Process  Thought Processes:Coherent; Goal Directed; Linear  Descriptions of Associations:Intact  Orientation:Full (Time, Place and Person)  Thought Content:Logical  History of Schizophrenia/Schizoaffective disorder:No  Duration of Psychotic Symptoms:No data recorded Hallucinations:No data recorded Ideas of Reference:None  Suicidal Thoughts:No data recorded Homicidal Thoughts:No data recorded  Sensorium  Memory:Immediate Good; Recent Good; Remote Good  Judgment:Other (comment) (poor judgement related to his need for substane use treatment; otherwise, fair)  Insight:Lacking; Fair; Other (comment) (limited insight into the severity if his alcohol use; otherwise insight is fair)   Executive Functions  Concentration:Good  Attention Span:Good  Recall:Good  Fund of Knowledge:Good  Language:Good   Psychomotor Activity  Psychomotor Activity:No data recorded  Assets  Assets:Communication Skills; Desire for Improvement; Financial Resources/Insurance; Resilience; Housing    Sleep  Sleep:No data recorded  Physical Exam: Physical Exam Vitals and nursing note reviewed.  Cardiovascular:     Rate and Rhythm: Normal rate and regular rhythm.  Pulmonary:     Effort: Pulmonary effort is normal.  Neurological:     Mental Status: He is oriented to person, place, and time.  Psychiatric:        Mood and Affect: Mood normal.        Behavior: Behavior normal.        Thought Content: Thought  content normal.    Review of Systems  HENT: Negative.    Eyes: Negative.   Cardiovascular: Negative.   Genitourinary: Negative.   Skin: Negative.   Psychiatric/Behavioral:  Positive for  depression and substance abuse. Negative for hallucinations and suicidal ideas. The patient does not have insomnia.   All other systems reviewed and are negative.  Blood pressure (!) 134/90, pulse (!) 108, temperature 98 F (36.7 C), resp. rate (!) 21, height 6' (1.829 m), weight 71 kg, SpO2 96 %. Body mass index is 21.23 kg/m.  Medical Decision Making:  Patient to keep all outpatient follow-up appointments We will make substance abuse resources available  Disposition: No evidence of imminent risk to self or others at present.   Patient does not meet criteria for psychiatric inpatient admission. Supportive therapy provided about ongoing stressors. Refer to IOP. Discussed crisis plan, support from social network, calling 911, coming to the Emergency Department, and calling Suicide Hotline.  Oneta Rack, NP 08/06/2021 2:26 PM

## 2021-08-06 NOTE — ED Notes (Signed)
Pt requesting IV ativan for sleep. Explained to patient that the ER physicians don't administered IV ativan for sleep.

## 2021-08-11 ENCOUNTER — Encounter (HOSPITAL_COMMUNITY): Payer: Self-pay | Admitting: Emergency Medicine

## 2021-08-11 ENCOUNTER — Emergency Department (HOSPITAL_COMMUNITY)
Admission: EM | Admit: 2021-08-11 | Discharge: 2021-08-13 | Disposition: A | Discharge status: Other Acute Inpatient Hospital | Payer: Self-pay | Attending: Emergency Medicine | Admitting: Emergency Medicine

## 2021-08-11 DIAGNOSIS — F1010 Alcohol abuse, uncomplicated: Secondary | ICD-10-CM

## 2021-08-11 DIAGNOSIS — F1014 Alcohol abuse with alcohol-induced mood disorder: Secondary | ICD-10-CM | POA: Diagnosis present

## 2021-08-11 DIAGNOSIS — R45851 Suicidal ideations: Secondary | ICD-10-CM

## 2021-08-11 DIAGNOSIS — Z20822 Contact with and (suspected) exposure to covid-19: Secondary | ICD-10-CM | POA: Insufficient documentation

## 2021-08-11 DIAGNOSIS — Z046 Encounter for general psychiatric examination, requested by authority: Secondary | ICD-10-CM | POA: Insufficient documentation

## 2021-08-11 LAB — CBC WITH DIFFERENTIAL/PLATELET
Abs Immature Granulocytes: 0.02 10*3/uL (ref 0.00–0.07)
Basophils Absolute: 0 10*3/uL (ref 0.0–0.1)
Basophils Relative: 1 %
Eosinophils Absolute: 0 10*3/uL (ref 0.0–0.5)
Eosinophils Relative: 0 %
HCT: 46.6 % (ref 39.0–52.0)
Hemoglobin: 16.5 g/dL (ref 13.0–17.0)
Immature Granulocytes: 0 %
Lymphocytes Relative: 19 %
Lymphs Abs: 1.7 10*3/uL (ref 0.7–4.0)
MCH: 32.6 pg (ref 26.0–34.0)
MCHC: 35.4 g/dL (ref 30.0–36.0)
MCV: 92.1 fL (ref 80.0–100.0)
Monocytes Absolute: 0.7 10*3/uL (ref 0.1–1.0)
Monocytes Relative: 8 %
Neutro Abs: 6.3 10*3/uL (ref 1.7–7.7)
Neutrophils Relative %: 72 %
Platelets: 383 10*3/uL (ref 150–400)
RBC: 5.06 MIL/uL (ref 4.22–5.81)
RDW: 13.1 % (ref 11.5–15.5)
WBC: 8.7 10*3/uL (ref 4.0–10.5)
nRBC: 0 % (ref 0.0–0.2)

## 2021-08-11 LAB — COMPREHENSIVE METABOLIC PANEL
ALT: 45 U/L — ABNORMAL HIGH (ref 0–44)
AST: 37 U/L (ref 15–41)
Albumin: 4.9 g/dL (ref 3.5–5.0)
Alkaline Phosphatase: 89 U/L (ref 38–126)
Anion gap: 18 — ABNORMAL HIGH (ref 5–15)
BUN: 5 mg/dL — ABNORMAL LOW (ref 6–20)
CO2: 24 mmol/L (ref 22–32)
Calcium: 9.1 mg/dL (ref 8.9–10.3)
Chloride: 94 mmol/L — ABNORMAL LOW (ref 98–111)
Creatinine, Ser: 0.73 mg/dL (ref 0.61–1.24)
GFR, Estimated: >60 mL/min (ref >60)
Glucose, Bld: 110 mg/dL — ABNORMAL HIGH (ref 70–99)
Potassium: 3.3 mmol/L — ABNORMAL LOW (ref 3.5–5.1)
Sodium: 136 mmol/L (ref 135–145)
Total Bilirubin: 1.2 mg/dL (ref 0.3–1.2)
Total Protein: 9 g/dL — ABNORMAL HIGH (ref 6.5–8.1)

## 2021-08-11 LAB — SALICYLATE LEVEL: Salicylate Lvl: <7 mg/dL — ABNORMAL LOW (ref 7.0–30.0)

## 2021-08-11 LAB — RESP PANEL BY RT-PCR (FLU A&B, COVID) ARPGX2
Influenza A by PCR: NEGATIVE
Influenza B by PCR: NEGATIVE
SARS Coronavirus 2 by RT PCR: NEGATIVE

## 2021-08-11 LAB — ETHANOL: Alcohol, Ethyl (B): 262 mg/dL — ABNORMAL HIGH (ref <10)

## 2021-08-11 LAB — RAPID URINE DRUG SCREEN, HOSP PERFORMED
Amphetamines: NOT DETECTED
Barbiturates: NOT DETECTED
Benzodiazepines: NOT DETECTED
Cocaine: NOT DETECTED
Opiates: NOT DETECTED
Tetrahydrocannabinol: NOT DETECTED

## 2021-08-11 LAB — ACETAMINOPHEN LEVEL: Acetaminophen (Tylenol), Serum: <10 ug/mL — ABNORMAL LOW (ref 10–30)

## 2021-08-11 MED ORDER — SODIUM CHLORIDE 0.9 % IV BOLUS
1000.0000 mL | Freq: Once | INTRAVENOUS | Status: AC
  Administered 2021-08-11: 1000 mL via INTRAVENOUS

## 2021-08-11 MED ORDER — BENZTROPINE MESYLATE 0.5 MG PO TABS
1.0000 mg | ORAL_TABLET | Freq: Four times a day (QID) | ORAL | Status: DC | PRN
  Administered 2021-08-11: 1 mg via ORAL
  Filled 2021-08-11: qty 2

## 2021-08-11 MED ORDER — LORAZEPAM 2 MG/ML IJ SOLN
0.5000 mg | Freq: Once | INTRAMUSCULAR | Status: AC
  Administered 2021-08-11: 0.5 mg via INTRAVENOUS
  Filled 2021-08-11: qty 1

## 2021-08-11 MED ORDER — QUETIAPINE FUMARATE 50 MG PO TABS
50.0000 mg | ORAL_TABLET | Freq: Every day | ORAL | Status: DC
  Administered 2021-08-11 – 2021-08-12 (×2): 50 mg via ORAL
  Filled 2021-08-11 (×2): qty 1

## 2021-08-11 MED ORDER — THIAMINE HCL 100 MG/ML IJ SOLN
100.0000 mg | Freq: Every day | INTRAMUSCULAR | Status: DC
  Administered 2021-08-11: 100 mg via INTRAVENOUS
  Filled 2021-08-11: qty 2

## 2021-08-11 MED ORDER — POTASSIUM CHLORIDE CRYS ER 20 MEQ PO TBCR
40.0000 meq | EXTENDED_RELEASE_TABLET | Freq: Once | ORAL | Status: AC
Start: 2021-08-11 — End: 2021-08-11
  Administered 2021-08-11: 40 meq via ORAL
  Filled 2021-08-11: qty 2

## 2021-08-11 MED ORDER — DIPHENHYDRAMINE HCL 50 MG/ML IJ SOLN
50.0000 mg | Freq: Once | INTRAMUSCULAR | Status: AC
Start: 2021-08-11 — End: 2021-08-11
  Administered 2021-08-11: 50 mg via INTRAVENOUS
  Filled 2021-08-11: qty 1

## 2021-08-11 MED ORDER — LORAZEPAM 2 MG/ML IJ SOLN: 0.0000 mg | Freq: Two times a day (BID) | INTRAMUSCULAR | Status: DC

## 2021-08-11 MED ORDER — LORAZEPAM 1 MG PO TABS: 0.0000 mg | ORAL_TABLET | Freq: Two times a day (BID) | ORAL | Status: DC

## 2021-08-11 MED ORDER — HALOPERIDOL 5 MG PO TABS
5.0000 mg | ORAL_TABLET | Freq: Four times a day (QID) | ORAL | Status: DC | PRN
  Administered 2021-08-11: 5 mg via ORAL
  Filled 2021-08-11: qty 1

## 2021-08-11 MED ORDER — SERTRALINE HCL 50 MG PO TABS
50.0000 mg | ORAL_TABLET | Freq: Every day | ORAL | Status: DC
  Administered 2021-08-12: 50 mg via ORAL
  Filled 2021-08-11: qty 1

## 2021-08-11 MED ORDER — LORAZEPAM 2 MG/ML IJ SOLN
0.0000 mg | Freq: Four times a day (QID) | INTRAMUSCULAR | Status: DC
  Administered 2021-08-11: 1 mg via INTRAVENOUS
  Administered 2021-08-12 (×2): 2 mg via INTRAVENOUS
  Administered 2021-08-12: 1 mg via INTRAVENOUS
  Filled 2021-08-11 (×4): qty 1

## 2021-08-11 MED ORDER — LORAZEPAM 1 MG PO TABS
0.0000 mg | ORAL_TABLET | Freq: Four times a day (QID) | ORAL | Status: DC
  Administered 2021-08-13 (×2): 1 mg via ORAL
  Filled 2021-08-11: qty 2
  Filled 2021-08-11: qty 1

## 2021-08-11 MED ORDER — THIAMINE HCL 100 MG PO TABS
100.0000 mg | ORAL_TABLET | Freq: Every day | ORAL | Status: DC
  Administered 2021-08-12: 100 mg via ORAL
  Filled 2021-08-11: qty 1

## 2021-08-11 NOTE — ED Triage Notes (Signed)
Pt arrives via sheriff IVC'd. Pt reports SI, anxiety and says he drinks 24 beers a day. Last drank an hour ago.

## 2021-08-11 NOTE — ED Notes (Signed)
Pts O2 88% on RA, placed on [place]

## 2021-08-11 NOTE — ED Notes (Signed)
Pt has refused to  keep nasal canula on.  Informed RN.

## 2021-08-11 NOTE — ED Notes (Signed)
Pt has been seen wand by security.  Pt has one bag of belongings.

## 2021-08-11 NOTE — ED Provider Notes (Signed)
[facility] DEPT Provider Note   CSN: [id number] Arrival date & time: 08/11/21  1407     History  Chief Complaint  Patient presents with   Suicidal    Thomas [name] is a 31 y.o. male with history of major depressive disorder, alcohol use.  Presents to the emergency department with suicidal ideations.  Patient states that he has been having "major panic attacks," for the last 7 months.  Patient states "I need help.  When asked about suicidal ideations patient states "I want to die."  When asked about specific plan patient states "I am not strong enough to do it."  Patient denies any homicidal ideations, auditory hallucinations, or visual hallucinations.  Patient endorses alcohol use.  States he drinks approximately 24 beers daily.  Patient last drank alcohol approximate 1 hour prior to arrival in the emergency department.  Patient reports drinking 6 beers today.  Patient denies any illicit drug use.  IVC paperwork states "he is a heavy drinker, he drinks to the point where he uses the bathroom on himself.  The [place] is not eating or attending to his personal hygiene.  [name] threatened to kill his mother.  He drinks and drives.  Family is afraid he will kill someone by drinking and driving."  HPI     Home Medications Prior to Admission medications   Medication Sig Start Date End Date Taking? Authorizing Provider  gabapentin (NEURONTIN) 300 MG capsule Take 1 capsule (300 mg total) by mouth 3 (three) times daily. 06/23/21 07/23/21  Ival Bible, MD  Multiple Vitamin (MULTIVITAMIN WITH MINERALS) TABS tablet Take 1 tablet by mouth daily. Patient not taking: Reported on 08/05/2021 06/24/21   Ival Bible, MD  QUEtiapine (SEROQUEL) 50 MG tablet Take 1 tablet (50 mg total) by mouth at bedtime. Patient not taking: Reported on 08/05/2021 06/23/21   Ival Bible, MD  sertraline (ZOLOFT) 50 MG tablet Take 1 tablet (50 mg total) by mouth  daily. 06/23/21   Ival Bible, MD      Allergies    Patient has no known allergies.    Review of Systems   Review of Systems  Constitutional:  Negative for chills and fever.  Eyes:  Negative for visual disturbance.  Respiratory:  Negative for shortness of breath.   Cardiovascular:  Negative for chest pain.  Gastrointestinal:  Negative for abdominal pain, nausea and vomiting.  Genitourinary:  Negative for difficulty urinating and dysuria.  Musculoskeletal:  Negative for back pain and neck pain.  Skin:  Negative for color change and rash.  Neurological:  Negative for dizziness, syncope, light-headedness and headaches.  Psychiatric/Behavioral:  Positive for suicidal ideas. Negative for confusion, hallucinations and self-injury. The patient is nervous/anxious.     Physical Exam Updated Vital Signs BP (!) 141/91 (BP Location: Right Arm)   Pulse (!) 118   Temp 98.2 F (36.8 C) (Oral)   Resp 20   Ht 6' (1.829 m)   Wt 71 kg   SpO2 90%   BMI 21.23 kg/m  Physical Exam Vitals and nursing note reviewed.  Constitutional:      General: He is not in acute distress.    Appearance: He is not ill-appearing, toxic-appearing or diaphoretic.  HENT:     Head: Normocephalic.  Eyes:     General: No scleral icterus.       Right eye: No discharge.        Left eye: No discharge.  Cardiovascular:  Rate and Rhythm: Normal rate.  Pulmonary:     Effort: Pulmonary effort is normal.  Skin:    General: Skin is warm and dry.  Neurological:     General: No focal deficit present.     Mental Status: He is alert and oriented to person, place, and time.     GCS: GCS eye subscore is 4. GCS verbal subscore is 5. GCS motor subscore is 6.  Psychiatric:        Attention and Perception: He is attentive. He does not perceive auditory or visual hallucinations.        Mood and Affect: Mood is anxious.        Behavior: Behavior is cooperative.        Thought Content: Thought content is not paranoid  or delusional. Thought content includes suicidal ideation. Thought content does not include homicidal ideation. Thought content does not include homicidal or suicidal plan.    ED Results / Procedures / Treatments   Labs (all labs ordered are listed, but only abnormal results are displayed) Labs Reviewed  COMPREHENSIVE METABOLIC PANEL - Abnormal; Notable for the following components:      Result Value   Potassium 3.3 (*)    Chloride 94 (*)    Glucose, Bld 110 (*)    BUN 5 (*)    Total Protein 9.0 (*)    ALT 45 (*)    Anion gap 18 (*)    All other components within normal limits  ETHANOL - Abnormal; Notable for the following components:   Alcohol, Ethyl (B) 262 (*)    All other components within normal limits  SALICYLATE LEVEL - Abnormal; Notable for the following components:   Salicylate Lvl <7.0 (*)    All other components within normal limits  ACETAMINOPHEN LEVEL - Abnormal; Notable for the following components:   Acetaminophen (Tylenol), Serum <10 (*)    All other components within normal limits  RESP PANEL BY RT-PCR (FLU A&B, COVID) ARPGX2  RAPID URINE DRUG SCREEN, HOSP PERFORMED  CBC WITH DIFFERENTIAL/PLATELET    EKG None  Radiology No results found.  Procedures Procedures    Medications Ordered in ED Medications  haloperidol (HALDOL) tablet 5 mg (5 mg Oral Given 08/11/21 1510)    And  benztropine (COGENTIN) tablet 1 mg (1 mg Oral Given 08/11/21 1510)  LORazepam (ATIVAN) injection 0-4 mg (1 mg Intravenous Given 08/11/21 1907)    Or  LORazepam (ATIVAN) tablet 0-4 mg ( Oral See Alternative 08/11/21 1907)  LORazepam (ATIVAN) injection 0-4 mg (has no administration in time range)    Or  LORazepam (ATIVAN) tablet 0-4 mg (has no administration in time range)  thiamine (VITAMIN B1) tablet 100 mg ( Oral See Alternative 08/11/21 1910)    Or  thiamine (VITAMIN B1) injection 100 mg (100 mg Intravenous Given 08/11/21 1910)  LORazepam (ATIVAN) injection 0.5 mg (0.5 mg Intravenous  Given 08/11/21 1606)  potassium chloride SA (KLOR-CON M) CR tablet 40 mEq (40 mEq Oral Given 08/11/21 1659)  sodium chloride 0.9 % bolus 1,000 mL (0 mLs Intravenous Stopped 08/11/21 1750)  sodium chloride 0.9 % bolus 1,000 mL (0 mLs Intravenous Stopped 08/11/21 2210)  diphenhydrAMINE (BENADRYL) injection 50 mg (50 mg Intravenous Given 08/11/21 2212)    ED Course/ Medical Decision Making/ A&P Clinical Course as of 08/11/21 2253  Mon Aug 11, 2021  1619 I attempted to contact patient's mother multiple times to collect collateral in formation but was unable to reach her.  [PB]  2204  I was contacted by RN that patient was reporting a rash and pruritus.  Patient noted to have urticarial rash to his back.  No angioedema, stridor, trouble breathing or swallowing.  We will give patient Benadryl at this time. [PB]    Clinical Course User Index [PB] Haskel Schroeder, PA-C                           Medical Decision Making Risk OTC drugs. Prescription drug management.   Alert 31 year old male in no acute distress, nontoxic-appearing.  Presents emergency department complaint of suicidal ideations and alcohol use.  Information was obtained from patient.  I attempted to contact patient's mother for collateral however was unable to reach her.  I reviewed patient's past medical records including previous prior notes, labs, and imaging.  Patient has medical history as outlined in HPI.  Per chart review IVC paperwork was taken out on patient on 08/05/2021 by his mother.  At that time patient expressed suicidal ideation to shoot himself with a gun.  Patient did not meet inpatient criteria for admission at that visit and was discharged with outpatient resources.  With reports of suicidal ideation and under IVC will order medical clearance labs.  Patient will need TTS consult.  Due to daily EtOH use will initiate CIWA protocol.  I personally viewed and interpreted patient's EKG.  Tracing shows sinus  tachycardia.  I personally viewed and interpret patient's lab results.  Pertinent findings include: -Ethanol 262 -Potassium 3.3 -Anion gap 18 -Salicylate level and acetaminophen level unremarkable -UDS negative.  Patient medically cleared at this time.  Will consult TTS for psych evaluation.  Home medications reordered.  Patient was seen by psychiatric team.  Plan is to monitor overnight and reevaluate in a.m.        Final Clinical Impression(s) / ED Diagnoses Final diagnoses:  Suicidal ideation  Alcohol abuse    Rx / DC Orders ED Discharge Orders     None         Berneice Heinrich 08/11/21 2354    Benjiman Core, MD 08/12/21 0000

## 2021-08-11 NOTE — ED Notes (Signed)
Pt had a phone call.

## 2021-08-11 NOTE — Consult Note (Signed)
[facility] ED ASSESSMENT   Reason for Consult:  Psychiatry evaluation Referring Physician:  ER Physician Patient Identification: Thomas [name] MRN:  [id number] ED Chief Complaint: Alcohol abuse with alcohol-induced mood disorder (HCC)  Diagnosis:  Principal Problem:   Alcohol abuse with alcohol-induced mood disorder [facility])   ED Assessment Time Calculation: No data recorded  Subjective:   Thomas [name] is a 31 y.o. male patient admitted with hx of Depression, Bipolar disorder, anxiety , Alcohol abuse and panic attack was brought in by the [facility] under IVC for suicide ideation, severe anxiety and panic attack.  Patient was discharged from here last week 8/2 /2023 for depression and Alcohol issues.   HPI:  Patient was seen for evaluation and was anxious, restless and connected to the monitor with heart rate in the 120s.  Patient reported that he drank  24 beers yesterday and another 6-8 beers today.  Patient reported drinking to treat anxiety and panic attack.  Patient states that he cannot stop drinking because that is the only way and treats anxiety.  Patient reports that he lives with his father but lately he has been sleeping outside the house because his father does not want him in the house.  He also stated that his mother does not want him around.  Patient reported poor appetite and sleep stating that Alcohol fills his stomach and that he drinks to help him sleep.  His Alcohol level is 262 and UDS is negative.  Patient does not have outpatient Psychiatrist and also stated that he does not take his medications as prescribed.  He remains suicidal with plan to drink himself to death.  We will observe overnight and reevaluate in am.for appropriate disposition.  Past Psychiatric History: MDD, alcohol use, anxiety, insomnia, and generalized anxiety disorder.  Patient was hospitalized in [facility] in March of this year.  He was also hospitalized November 2021 at [facility]. Patient has had multiple ER  Visits.as well.  Risk to Self or Others: Is the patient at risk to self? Yes Has the patient been a risk to self in the past 6 months? Yes Has the patient been a risk to self within the distant past? Yes Is the patient a risk to others? No Has the patient been a risk to others in the past 6 months? No Has the patient been a risk to others within the distant past? No  [place] Scale:  Flowsheet Row ED from 08/11/2021 in [facility] from 08/05/2021 in [facility] from 06/20/2021 in [facility]  C-SSRS RISK CATEGORY High Risk High Risk Low Risk       AIMS:  , , ,  ,   ASAM:    Substance Abuse:     Past Medical History:  Past Medical History:  Diagnosis Date   Anxiety     Past Surgical History:  Procedure Laterality Date   APPENDECTOMY     Family History: No family history on file. Family Psychiatric  History: unknown Social History:  Social History   Substance and Sexual Activity  Alcohol Use Yes   Alcohol/week: 140.0 standard drinks of alcohol   Types: 140 Cans of beer per week   Comment: reported 20+ beers/day     Social History   Substance and Sexual Activity  Drug Use No    Social History   Socioeconomic History   Marital status: Single    Spouse name: Not on file   Number  of children: Not on file   Years of education: Not on file   Highest education level: Not on file  Occupational History   Not on file  Tobacco Use   Smoking status: Never   Smokeless tobacco: Never  Vaping Use   Vaping Use: Never used  Substance and Sexual Activity   Alcohol use: Yes    Alcohol/week: 140.0 standard drinks of alcohol    Types: 140 Cans of beer per week    Comment: reported 20+ beers/day   Drug use: No   Sexual activity: Yes  Other Topics Concern   Not on file  Social History Narrative   Not on file   Social Determinants of Health   Financial Resource Strain:  Not on file  Food Insecurity: Not on file  Transportation Needs: Not on file  Physical Activity: Not on file  Stress: Not on file  Social Connections: Not on file   Additional Social History:    Allergies:  No Known Allergies  Labs:  Results for orders placed or performed during the hospital encounter of 08/11/21 (from the past 48 hour(s))  Comprehensive metabolic panel     Status: Abnormal   Collection Time: 08/11/21  3:35 PM  Result Value Ref Range   Sodium 136 135 - 145 mmol/L   Potassium 3.3 (L) 3.5 - 5.1 mmol/L   Chloride 94 (L) 98 - 111 mmol/L   CO2 24 22 - 32 mmol/L   Glucose, Bld 110 (H) 70 - 99 mg/dL    Comment: Glucose reference range applies only to samples taken after fasting for at least 8 hours.   BUN 5 (L) 6 - 20 mg/dL   Creatinine, Ser 6.31 0.61 - 1.24 mg/dL   Calcium 9.1 8.9 - 49.7 mg/dL   Total Protein 9.0 (H) 6.5 - 8.1 g/dL   Albumin 4.9 3.5 - 5.0 g/dL   AST 37 15 - 41 U/L   ALT 45 (H) 0 - 44 U/L   Alkaline Phosphatase 89 38 - 126 U/L   Total Bilirubin 1.2 0.3 - 1.2 mg/dL   GFR, Estimated >02 >63 mL/min    Comment: (NOTE) Calculated using the CKD-EPI Creatinine Equation (2021)    Anion gap 18 (H) 5 - 15    Comment: Performed at [facility], [address] [place], [place] [address]  Ethanol     Status: Abnormal   Collection Time: 08/11/21  3:35 PM  Result Value Ref Range   Alcohol, Ethyl (B) 262 (H) <10 mg/dL    Comment: (NOTE) Lowest detectable limit for serum alcohol is 10 mg/dL.  For medical purposes only. Performed at [facility], [address], [place], [place] [address]   CBC with Diff     Status: None   Collection Time: 08/11/21  3:35 PM  Result Value Ref Range   WBC 8.7 4.0 - 10.5 K/uL   RBC 5.06 4.22 - 5.81 MIL/uL   Hemoglobin 16.5 13.0 - 17.0 g/dL   HCT 41.2 87.8 - 67.6 %   MCV 92.1 80.0 - 100.0 fL   MCH 32.6 26.0 - 34.0 pg   MCHC 35.4 30.0 - 36.0 g/dL   RDW 72.0 94.7 - 09.6 %    Platelets 383 150 - 400 K/uL   nRBC 0.0 0.0 - 0.2 %   Neutrophils Relative % 72 %   Neutro Abs 6.3 1.7 - 7.7 K/uL   Lymphocytes Relative 19 %   Lymphs Abs 1.7 0.7 - 4.0  K/uL   Monocytes Relative 8 %   Monocytes Absolute 0.7 0.1 - 1.0 K/uL   Eosinophils Relative 0 %   Eosinophils Absolute 0.0 0.0 - 0.5 K/uL   Basophils Relative 1 %   Basophils Absolute 0.0 0.0 - 0.1 K/uL   Immature Granulocytes 0 %   Abs Immature Granulocytes 0.02 0.00 - 0.07 K/uL    Comment: Performed at [facility], [address] [place], [place] [address]  Salicylate level     Status: Abnormal   Collection Time: 08/11/21  3:35 PM  Result Value Ref Range   Salicylate Lvl <7.0 (L) 7.0 - 30.0 mg/dL    Comment: Performed at [facility], [address] [place], [place] [address]  Acetaminophen level     Status: Abnormal   Collection Time: 08/11/21  3:35 PM  Result Value Ref Range   Acetaminophen (Tylenol), Serum <10 (L) 10 - 30 ug/mL    Comment: (NOTE) Therapeutic concentrations vary significantly. A range of 10-30 ug/mL  may be an effective concentration for many patients. However, some  are best treated at concentrations outside of this range. Acetaminophen concentrations >150 ug/mL at 4 hours after ingestion  and >50 ug/mL at 12 hours after ingestion are often associated with  toxic reactions.  Performed at [facility], [address] [place], [place] [address]   Urine rapid drug screen (hosp performed)     Status: None   Collection Time: 08/11/21  4:04 PM  Result Value Ref Range   Opiates NONE DETECTED NONE DETECTED   Cocaine NONE DETECTED NONE DETECTED   Benzodiazepines NONE DETECTED NONE DETECTED   Amphetamines NONE DETECTED NONE DETECTED   Tetrahydrocannabinol NONE DETECTED NONE DETECTED   Barbiturates NONE DETECTED NONE DETECTED    Comment: (NOTE) DRUG SCREEN FOR MEDICAL PURPOSES ONLY.  IF CONFIRMATION IS NEEDED FOR ANY PURPOSE, NOTIFY  LAB WITHIN 5 DAYS.  LOWEST DETECTABLE LIMITS FOR URINE DRUG SCREEN Drug Class                     Cutoff (ng/mL) Amphetamine and metabolites    1000 Barbiturate and metabolites    200 Benzodiazepine                 200 Tricyclics and metabolites     300 Opiates and metabolites        300 Cocaine and metabolites        300 THC                            50 Performed at [facility], [address] [place], [place] [address]     Current Facility-Administered Medications  Medication Dose Route Frequency Provider Last Rate Last Admin   haloperidol (HALDOL) tablet 5 mg  5 mg Oral Q6H PRN Haskel SchroederBadalamente, Peter R, PA-C   5 mg at 08/11/21 1510   And   benztropine (COGENTIN) tablet 1 mg  1 mg Oral Q6H PRN Haskel SchroederBadalamente, Peter R, PA-C   1 mg at 08/11/21 1510   LORazepam (ATIVAN) injection 0-4 mg  0-4 mg Intravenous Q6H Haskel SchroederBadalamente, Peter R, PA-C   1 mg at 08/11/21 1907   Or   LORazepam (ATIVAN) tablet 0-4 mg  0-4 mg Oral Q6H Haskel SchroederBadalamente, Peter R, PA-C       [START ON 08/14/2021] LORazepam (ATIVAN) injection 0-4 mg  0-4 mg Intravenous Q12H Haskel SchroederBadalamente, Peter R, PA-C  Or   [START ON 08/14/2021] LORazepam (ATIVAN) tablet 0-4 mg  0-4 mg Oral Q12H Badalamente, Peter R, PA-C       thiamine (VITAMIN B1) tablet 100 mg  100 mg Oral Daily Haskel Schroeder, PA-C       Or   thiamine (VITAMIN B1) injection 100 mg  100 mg Intravenous Daily Haskel Schroeder, PA-C   100 mg at 08/11/21 1910   Current Outpatient Medications  Medication Sig Dispense Refill   gabapentin (NEURONTIN) 300 MG capsule Take 1 capsule (300 mg total) by mouth 3 (three) times daily. (Patient not taking: Reported on 08/11/2021) 90 capsule 0   Multiple Vitamin (MULTIVITAMIN WITH MINERALS) TABS tablet Take 1 tablet by mouth daily. (Patient not taking: Reported on 08/11/2021) 30 tablet 0   QUEtiapine (SEROQUEL) 50 MG tablet Take 1 tablet (50 mg total) by mouth at bedtime. (Patient not taking: Reported on 08/11/2021) 30  tablet 0   sertraline (ZOLOFT) 50 MG tablet Take 1 tablet (50 mg total) by mouth daily. (Patient not taking: Reported on 08/11/2021) 30 tablet 0    Musculoskeletal: Strength & Muscle Tone:  in bed lying down Gait & Station:  in bed lying down Patient leans:  see above   Psychiatric Specialty Exam: Presentation  General Appearance: Disheveled  Eye Contact:Minimal  Speech:Clear and Coherent; Normal Rate  Speech Volume:Normal  Handedness:Right   Mood and Affect  Mood:Anxious  Affect:Congruent   Thought Process  Thought Processes:Coherent; Goal Directed  Descriptions of Associations:Intact  Orientation:Full (Time, Place and Person)  Thought Content:Logical  History of Schizophrenia/Schizoaffective disorder:No  Duration of Psychotic Symptoms:No data recorded Hallucinations:Hallucinations: None  Ideas of Reference:None  Suicidal Thoughts:Suicidal Thoughts: No  Homicidal Thoughts:Homicidal Thoughts: No   Sensorium  Memory:Immediate Good; Recent Good; Remote Good  Judgment:Poor  Insight:Poor   Executive Functions  Concentration:Poor  Attention Span:Poor  Recall:Fair  Fund of Knowledge:Fair  Language:Good   Psychomotor Activity  Psychomotor Activity:Psychomotor Activity: Tremor   Assets  Assets:Communication Skills    Sleep  Sleep:Sleep: Poor   Physical Exam: Physical Exam Vitals and nursing note reviewed.  Constitutional:      Appearance: He is ill-appearing.     Comments: Disheveled, ill looking, thin  HENT:     Head: Normocephalic and atraumatic.     Nose: Nose normal.  Cardiovascular:     Rate and Rhythm: Tachycardia present.  Pulmonary:     Effort: Pulmonary effort is normal.  Musculoskeletal:        General: Normal range of motion.     Cervical back: Normal range of motion.  Skin:    General: Skin is warm and dry.  Neurological:     Mental Status: He is alert and oriented to person, place, and time.    Review of  Systems  Constitutional:  Positive for weight loss.  HENT: Negative.    Eyes: Negative.   Respiratory: Negative.    Cardiovascular: Negative.   Gastrointestinal: Negative.   Genitourinary: Negative.   Musculoskeletal: Negative.   Skin: Negative.   Neurological: Negative.   Endo/Heme/Allergies: Negative.   Psychiatric/Behavioral:  Positive for substance abuse. The patient is nervous/anxious and has insomnia.    Blood pressure 138/86, pulse (!) 111, temperature 98.2 F (36.8 C), resp. rate 16, height 6' (1.829 m), weight 71 kg, SpO2 94 %. Body mass index is 21.23 kg/m.  Medical Decision Making: Patient is going through alcohol withdrawal at this time.  We have initiated CIWA Protocol and will utilize Ativan for detox treatment.  We will monitor overnight and reevaluate the need for inpatient hospitalization.  Problem 1: Alcohol abuse with alcohol induced mood disorder Problem 2: Anxiety disorder  Problem 3: MDD,recurrent  Disposition:  Monitor overnight and reevaluate in am.  Earney Navy, NP-PMHNP-BC 08/11/2021 7:39 PM

## 2021-08-12 MED ORDER — GABAPENTIN 100 MG PO CAPS
200.0000 mg | ORAL_CAPSULE | Freq: Three times a day (TID) | ORAL | Status: DC
  Administered 2021-08-12 (×2): 200 mg via ORAL
  Filled 2021-08-12 (×2): qty 2

## 2021-08-12 NOTE — Consult Note (Cosign Needed Addendum)
Telepsych Consultation   Reason for Consult:  Psychiatric Reassessment for SI/HI.  Referring Physician:  Loni Beckwith, PA-C Location of Patient:    [facility] Location of Provider: Other: virtual home office  Patient Identification: Thomas [name] MRN:  [id number] Principal Diagnosis: Alcohol abuse with alcohol-induced mood disorder ([place]) Diagnosis:  Principal Problem:   Alcohol abuse with alcohol-induced mood disorder ([place])   Total Time spent with patient: 30 minutes  Subjective:   Thomas [name] is a 31 y.o. male with anxiety disorder, panic attacks and alcohol dependence, admitted for suicidal and homicidal ideations, admitting BAL was 262.  HPI:   Patient seen via telepsych by this provider; chart reviewed and consulted with Dr. Dwyane Dee on 08/12/21.  On evaluation Thomas [name] reports he no longer wants to hurt himself or hurt anyone else, "I never want to hurt my family members. It's the alcohol and I've been doing this for a while."He endorses most of what has already been capture in admission [facility] assessment.  Reports a history for alcohol dependency/abuse, gets intoxicated and becomes irritated and exercises poor judgement.  When he sobers up, he has suicidal thoughts that he reports as passive, no plan or intent to end his life; he reports homicidal ideations clear up but does verbalize remorse with HI.     He lives at home with is Dad, pt reports he wants him to get help.  Pt does not want this writer to reach out to his father for collateral. Pt states he's already worked out a plan for alcohol treatment upon discharge but cannot give specific plan or name a treatment facility.  He is offered referral to [facility] for SUD detox and tx for panic attacks, mental health but he declines. He denies hx of alcohol withdrawal seizures.   At present, he is sitting upright on the exam gurney, appears tired but is A&Ox4 and able to appropriately engage with this Probation officer  during assessment.  Pt reports headache, but otherwise denies nausea, vomiting, GI concerns; extends his hand forward and no tremors seen.  He was placed on CIWA ativan protocol at admission, states this has helped/managed his withdrawal symptoms.   Collateral: attempted to reach pt father,Thomas [name], who is also the IVC petitioner.  Could not reach him but spoke with pt mother, Thomas [name] @[phone]. This Probation officer explained purpose of the call to gain clarification of information obtained in IVC but unable to discuss patients care.  Ms. Zenovia Jordan understanding and states she is aware her son is a Government social research officer hospital.  She states this is IVC#9, states his behaviors started about 2 years ago and have been extreme to the point that both she and his father have banned pt from her home.  States he has panic attacks and drinks to treat these symptoms. States her son is afraid to get care for anxiety, panic attacks and "just keep drinking."   She collaborates patient claims of passive suicidal ideations without plan or intent when he is intoxicated; but states he threatens to kills both her and pt father when he's sober which is alarming to her.  Reports patient describes graphic ways that he plans to kill himself and adds, "guess, I'm gonna have to take care of all of yall too." She reports this behaviors gets worse when he's intoxicated and he stands outside of her house, screaming and yelling, defecating and urinating on himself while making threats because the refuse to let him in.  She reports  at baseline pt has depression, anxiety and drinks to compensate.  She does not believe he sees an outpatient mental health provider regularly and does not take antidepressant medications, leaving him vulnerable to suicide.     Past Psychiatric History: MDD, alcohol use, anxiety, insomnia, and generalized anxiety disorder.  Patient was hospitalized in [facility] in March of this year.  He was also hospitalized  November 2021 at [facility]. Patient has had multiple ER Visits.as well.  Risk to Self:  no Risk to Others:  no Prior Inpatient Therapy:  yes as outlined above Prior Outpatient Therapy:  yes  Past Medical History:  Past Medical History:  Diagnosis Date   Anxiety     Past Surgical History:  Procedure Laterality Date   APPENDECTOMY     Family History: No family history on file. Family Psychiatric  History: unknown Social History:  Social History   Substance and Sexual Activity  Alcohol Use Yes   Alcohol/week: 140.0 standard drinks of alcohol   Types: 140 Cans of beer per week   Comment: reported 20+ beers/day     Social History   Substance and Sexual Activity  Drug Use No    Social History   Socioeconomic History   Marital status: Single    Spouse name: Not on file   Number of children: Not on file   Years of education: Not on file   Highest education level: Not on file  Occupational History   Not on file  Tobacco Use   Smoking status: Never   Smokeless tobacco: Never  Vaping Use   Vaping Use: Never used  Substance and Sexual Activity   Alcohol use: Yes    Alcohol/week: 140.0 standard drinks of alcohol    Types: 140 Cans of beer per week    Comment: reported 20+ beers/day   Drug use: No   Sexual activity: Yes  Other Topics Concern   Not on file  Social History Narrative   Not on file   Social Determinants of Health   Financial Resource Strain: Not on file  Food Insecurity: Not on file  Transportation Needs: Not on file  Physical Activity: Not on file  Stress: Not on file  Social Connections: Not on file   Additional Social History:    Allergies:  No Known Allergies  Labs:  Results for orders placed or performed during the hospital encounter of 08/11/21 (from the past 48 hour(s))  Comprehensive metabolic panel     Status: Abnormal   Collection Time: 08/11/21  3:35 PM  Result Value Ref Range   Sodium 136 135 - 145 mmol/L   Potassium 3.3 (L) 3.5 -  5.1 mmol/L   Chloride 94 (L) 98 - 111 mmol/L   CO2 24 22 - 32 mmol/L   Glucose, Bld 110 (H) 70 - 99 mg/dL    Comment: Glucose reference range applies only to samples taken after fasting for at least 8 hours.   BUN 5 (L) 6 - 20 mg/dL   Creatinine, Ser 0.73 0.61 - 1.24 mg/dL   Calcium 9.1 8.9 - 10.3 mg/dL   Total Protein 9.0 (H) 6.5 - 8.1 g/dL   Albumin 4.9 3.5 - 5.0 g/dL   AST 37 15 - 41 U/L   ALT 45 (H) 0 - 44 U/L   Alkaline Phosphatase 89 38 - 126 U/L   Total Bilirubin 1.2 0.3 - 1.2 mg/dL   GFR, Estimated >60 >60 mL/min    Comment: (NOTE) Calculated using the CKD-EPI Creatinine Equation (  2021)    Anion gap 18 (H) 5 - 15    Comment: Performed at [facility], [place] [address] [place], [place] [address]  Ethanol     Status: Abnormal   Collection Time: 08/11/21  3:35 PM  Result Value Ref Range   Alcohol, Ethyl (B) 262 (H) <10 mg/dL    Comment: (NOTE) Lowest detectable limit for serum alcohol is 10 mg/dL.  For medical purposes only. Performed at [facility], [place] [address] [place], [place] [address]   CBC with Diff     Status: None   Collection Time: 08/11/21  3:35 PM  Result Value Ref Range   WBC 8.7 4.0 - 10.5 K/uL   RBC 5.06 4.22 - 5.81 MIL/uL   Hemoglobin 16.5 13.0 - 17.0 g/dL   HCT 46.6 39.0 - 52.0 %   MCV 92.1 80.0 - 100.0 fL   MCH 32.6 26.0 - 34.0 pg   MCHC 35.4 30.0 - 36.0 g/dL   RDW 13.1 11.5 - 15.5 %   Platelets 383 150 - 400 K/uL   nRBC 0.0 0.0 - 0.2 %   Neutrophils Relative % 72 %   Neutro Abs 6.3 1.7 - 7.7 K/uL   Lymphocytes Relative 19 %   Lymphs Abs 1.7 0.7 - 4.0 K/uL   Monocytes Relative 8 %   Monocytes Absolute 0.7 0.1 - 1.0 K/uL   Eosinophils Relative 0 %   Eosinophils Absolute 0.0 0.0 - 0.5 K/uL   Basophils Relative 1 %   Basophils Absolute 0.0 0.0 - 0.1 K/uL   Immature Granulocytes 0 %   Abs Immature Granulocytes 0.02 0.00 - 0.07 K/uL    Comment: Performed at [facility], [place]  [address] [place], [place] [address]  Salicylate level     Status: Abnormal   Collection Time: 08/11/21  3:35 PM  Result Value Ref Range   Salicylate Lvl <4.4 (L) 7.0 - 30.0 mg/dL    Comment: Performed at [facility], [place] [address] [place], [place] [address]  Acetaminophen level     Status: Abnormal   Collection Time: 08/11/21  3:35 PM  Result Value Ref Range   Acetaminophen (Tylenol), Serum <10 (L) 10 - 30 ug/mL    Comment: (NOTE) Therapeutic concentrations vary significantly. A range of 10-30 ug/mL  may be an effective concentration for many patients. However, some  are best treated at concentrations outside of this range. Acetaminophen concentrations >150 ug/mL at 4 hours after ingestion  and >50 ug/mL at 12 hours after ingestion are often associated with  toxic reactions.  Performed at [facility], [place] [address], [place], [place] [address]   Urine rapid drug screen (hosp performed)     Status: None   Collection Time: 08/11/21  4:04 PM  Result Value Ref Range   Opiates NONE DETECTED NONE DETECTED   Cocaine NONE DETECTED NONE DETECTED   Benzodiazepines NONE DETECTED NONE DETECTED   Amphetamines NONE DETECTED NONE DETECTED   Tetrahydrocannabinol NONE DETECTED NONE DETECTED   Barbiturates NONE DETECTED NONE DETECTED    Comment: (NOTE) DRUG SCREEN FOR MEDICAL PURPOSES ONLY.  IF CONFIRMATION IS NEEDED FOR ANY PURPOSE, NOTIFY LAB WITHIN 5 DAYS.  LOWEST DETECTABLE LIMITS FOR URINE DRUG SCREEN Drug Class                     Cutoff (ng/mL) Amphetamine and metabolites    1000 Barbiturate and metabolites    200 Benzodiazepine  443 Tricyclics and metabolites     300 Opiates and metabolites        300 Cocaine and metabolites        300 THC                            50 Performed at [facility], [place] [address] [place], [place] [address]   Resp Panel by RT-PCR (Flu A&B, Covid) Anterior Nasal Swab      Status: None   Collection Time: 08/11/21 10:17 PM   Specimen: Anterior Nasal Swab  Result Value Ref Range   SARS Coronavirus 2 by RT PCR NEGATIVE NEGATIVE    Comment: (NOTE) SARS-CoV-2 target nucleic acids are NOT DETECTED.  The SARS-CoV-2 RNA is generally detectable in upper respiratory specimens during the acute phase of infection. The lowest concentration of SARS-CoV-2 viral copies this assay can detect is 138 copies/mL. A negative result does not preclude SARS-Cov-2 infection and should not be used as the sole basis for treatment or other patient management decisions. A negative result may occur with  improper specimen collection/handling, submission of specimen other than nasopharyngeal swab, presence of viral mutation(s) within the areas targeted by this assay, and inadequate number of viral copies(<138 copies/mL). A negative result must be combined with clinical observations, patient history, and epidemiological information. The expected result is Negative.  Fact Sheet for Patients:  EntrepreneurPulse.com.au  Fact Sheet for Healthcare Providers:  IncredibleEmployment.be  This test is no t yet approved or cleared by the [place] FDA and  has been authorized for detection and/or diagnosis of SARS-CoV-2 by FDA under an Emergency Use Authorization (EUA). This EUA will remain  in effect (meaning this test can be used) for the duration of the COVID-19 declaration under Section 564(b)(1) of the Act, 21 U.S.C.section 360bbb-3(b)(1), unless the authorization is terminated  or revoked sooner.       Influenza A by PCR NEGATIVE NEGATIVE   Influenza B by PCR NEGATIVE NEGATIVE    Comment: (NOTE) The Xpert Xpress SARS-CoV-2/FLU/RSV plus assay is intended as an aid in the diagnosis of influenza from Nasopharyngeal swab specimens and should not be used as a sole basis for treatment. Nasal washings and aspirates are unacceptable for Xpert Xpress  SARS-CoV-2/FLU/RSV testing.  Fact Sheet for Patients: EntrepreneurPulse.com.au  Fact Sheet for Healthcare Providers: IncredibleEmployment.be  This test is not yet approved or cleared by the [place] FDA and has been authorized for detection and/or diagnosis of SARS-CoV-2 by FDA under an Emergency Use Authorization (EUA). This EUA will remain in effect (meaning this test can be used) for the duration of the COVID-19 declaration under Section 564(b)(1) of the Act, 21 U.S.C. section 360bbb-3(b)(1), unless the authorization is terminated or revoked.  Performed at [facility], [place] [address], [place], [place] [address]     Medications:  Current Facility-Administered Medications  Medication Dose Route Frequency Provider Last Rate Last Admin   haloperidol (HALDOL) tablet 5 mg  5 mg Oral Q6H PRN Loni Beckwith, PA-C   5 mg at 08/11/21 1510   And   benztropine (COGENTIN) tablet 1 mg  1 mg Oral Q6H PRN Loni Beckwith, PA-C   1 mg at 08/11/21 1510   LORazepam (ATIVAN) injection 0-4 mg  0-4 mg Intravenous Q6H Loni Beckwith, PA-C   2 mg at 08/12/21 1016   Or   LORazepam (ATIVAN) tablet 0-4 mg  0-4 mg Oral Q6H Badalamente,  Rudell Cobb, PA-C       [START ON 08/14/2021] LORazepam (ATIVAN) injection 0-4 mg  0-4 mg Intravenous Q12H Loni Beckwith, PA-C       Or   [START ON 08/14/2021] LORazepam (ATIVAN) tablet 0-4 mg  0-4 mg Oral Q12H Loni Beckwith, PA-C       QUEtiapine (SEROQUEL) tablet 50 mg  50 mg Oral QHS Loni Beckwith, PA-C   50 mg at 08/11/21 2315   sertraline (ZOLOFT) tablet 50 mg  50 mg Oral Daily Loni Beckwith, PA-C   50 mg at 08/12/21 1010   thiamine (VITAMIN B1) tablet 100 mg  100 mg Oral Daily Loni Beckwith, PA-C   100 mg at 08/12/21 1010   Or   thiamine (VITAMIN B1) injection 100 mg  100 mg Intravenous Daily Loni Beckwith, PA-C   100 mg at 08/11/21 1910   Current  Outpatient Medications  Medication Sig Dispense Refill   gabapentin (NEURONTIN) 300 MG capsule Take 1 capsule (300 mg total) by mouth 3 (three) times daily. (Patient not taking: Reported on 08/11/2021) 90 capsule 0   Multiple Vitamin (MULTIVITAMIN WITH MINERALS) TABS tablet Take 1 tablet by mouth daily. (Patient not taking: Reported on 08/11/2021) 30 tablet 0   QUEtiapine (SEROQUEL) 50 MG tablet Take 1 tablet (50 mg total) by mouth at bedtime. (Patient not taking: Reported on 08/11/2021) 30 tablet 0   sertraline (ZOLOFT) 50 MG tablet Take 1 tablet (50 mg total) by mouth daily. (Patient not taking: Reported on 08/11/2021) 30 tablet 0    Musculoskeletal:pt moves all extremities and ambulates indepdently Strength & Muscle Tone: within normal limits Gait & Station: normal Patient leans: N/A   Psychiatric Specialty Exam:  Presentation  General Appearance: Fairly Groomed  Eye Contact:Fair  Speech:Slow; Clear and Coherent  Speech Volume:Decreased  Handedness:Right   Mood and Affect  Mood:Anxious  Affect:Congruent; Restricted   Thought Process  Thought Processes:Coherent; Goal Directed  Descriptions of Associations:Intact  Orientation:Full (Time, Place and Person)  Thought Content:Logical (pt more clear as he's sobering up from alcohol use)  History of Schizophrenia/Schizoaffective disorder:No  Duration of Psychotic Symptoms:No data recorded Hallucinations:Hallucinations: None  Ideas of Reference:None  Suicidal Thoughts:Suicidal Thoughts: Yes, Passive SI Passive Intent and/or Plan: Without Intent; Without Plan ("I would be okay if i went to sleep and didn't wake up.")  Homicidal Thoughts:Homicidal Thoughts: No   Sensorium  Memory:Immediate Good; Recent Good; Remote Good  Judgment:Poor (baseline, chronic in the setting of alcohol abuse)  Insight:Fair   Executive Functions  Concentration:Fair  Attention Span:Fair  [place] of  Knowledge:Good  Language:Good   Psychomotor Activity  Psychomotor Activity:Psychomotor Activity: Normal   Assets  Assets:Communication Skills; Social Support; Housing   Sleep  Sleep:Sleep: Fair Number of Hours of Sleep: 6    Physical Exam: Physical Exam Cardiovascular:     Rate and Rhythm: Tachycardia present.  Pulmonary:     Effort: Pulmonary effort is normal.  Musculoskeletal:        General: Normal range of motion.     Cervical back: Normal range of motion.  Neurological:     General: No focal deficit present.     Mental Status: He is alert and oriented to person, place, and time.  Psychiatric:        Attention and Perception: Attention and perception normal.        Mood and Affect: Mood is anxious. Affect is blunt.        Speech: Speech normal.  Behavior: Behavior is slowed (put pt states he has a headache). Behavior is cooperative.        Thought Content: Thought content includes suicidal (passive thoughts but no plan or intent for end his life) ideation.        Cognition and Memory: Cognition and memory normal.        Judgment: Judgment is impulsive (at baseline d/t chronic alcoholism but no acute safety concerns).    Review of Systems  Constitutional: Negative.   HENT: Negative.    Eyes: Negative.   Cardiovascular:        Elevated heart rate; pt already evaluated bu ED provider and medically cleared; elevated heart rate could be in the setting of anxiety; pt has hx of panic attacks  Genitourinary: Negative.   Musculoskeletal: Negative.   Skin: Negative.   Neurological:  Positive for headaches. Negative for dizziness, speech change, focal weakness, seizures, loss of consciousness and weakness.  Psychiatric/Behavioral:  Positive for suicidal ideas (passive, no plan or intent). The patient is nervous/anxious.    Blood pressure 123/89, pulse (!) 107, temperature 98.1 F (36.7 C), temperature source Oral, resp. rate 15, height 6' (1.829 m), weight 71 kg,  SpO2 99 %. Body mass index is 21.23 kg/m.  Treatment Plan Summary: Pt with hx of anxiety, panic attacks, depression and alcohol dependency presents with suicidal and homicidal ideations.  His presentation is complicated by alcohol abuse, admission BAL was 262 and unmanaged mood instabilities.  Per collateral received from his mother, at baseline pt is severely depressed and suicidal, symptoms are exacerbated when he intoxicated. Pt is very impulsive and has impaired judgment and lacks insight.  Based on above, He is an acute safety risk and would benefit from inpatient admissions where he can be restarted on antidepressant medications, assisted with detox and monitored for safety.  Pt would like to go home but he IVCd for safety concerns and we will uphold this recommendation.    Labs reviewed: ALT mildly elevated at 45; AST, AlK Phos WNL to continue psychiatric medications; EKG shows prolonged Qt intervals at 306/507 but WNL to continue current medications. Will add additional psychiatric medication with caution.   Pt has already been started om CIWA ativan protocol Restarted on home medications: Quetiapine 43m po qhs for sleep Sertraline 540mpo daily for depression/anxiety/mood Thiamine 10054mo daily for vitamin replacement Gabapentin 200m26m TID for alcohol use disorder  PRN haldol was d/c prolonged Qt intervals.   Disposition: Recommend psychiatric Inpatient admission when medically cleared.  This service was provided via telemedicine using a 2-way, interactive audio and video technology.  Spoke with Dr. DaniPattricia Boss Provider; ChelShelton SilvasSW informed of above recommendation and disposition via secure messaging.  Names of all persons participating in this telemedicine service and their role in this encounter. Name: Thomas [name]: Patient   Name: Thomas [name]: Pt's mother   Name: ShneMerlyn Lote: PMHNP    ShneMallie Darting 08/12/2021 1:01  PM

## 2021-08-12 NOTE — ED Provider Notes (Addendum)
Emergency Medicine Observation Re-evaluation Note  Thomas [name] is a 31 y.o. male, seen on rounds today.  Pt initially presented to the ED for complaints of Suicidal Currently, the patient is here for evaluation of si and has etoh abuse.  Physical Exam  BP 123/89   Pulse (!) 107   Temp 98.1 F (36.7 C) (Oral)   Resp 15   Ht 1.829 m (6')   Wt 71 kg   SpO2 99%   BMI 21.23 kg/m  Physical Exam General: wdwn Cardiac: mild tachycardia Lungs: no respiratory distress Psych: resting,   ED Course / MDM  EKG:   I have reviewed the labs performed to date as well as medications administered while in observation.  Recent changes in the last 24 hours include none.  Patient receiving Ativan for alcohol withdrawal.  Patient received thiamine, Zoloft 50 mg daily and Seroquel 50 mg nightly  Plan  Current plan is for psych reevaluation. Thomas [name] is under involuntary commitment.      Margarita Grizzle, MD 08/12/21 1350    Margarita Grizzle, MD 08/12/21 [phone]

## 2021-08-12 NOTE — BH Assessment (Signed)
[facility] Assessment Progress Note   Per Ophelia Shoulder, NP, this pt requires psychiatric hospitalization.  Brook, RN, [facility] has assigned pt to [facility] Rm 307-2 to the service of Dr Abbott Pao.  [facility] will be ready to receive pt at 16:00.  Pt presents under IVC initiated by pt's father, and upheld by EDP Benjiman Core, MD, and IVC documents have been faxed to [facility]  EDP Margarita Grizzle, NP and pt's nurse, Sherrilyn Rist, have been notified, and Sherrilyn Rist agrees to call report to [phone].  Pt is to be transported via Patent examiner.   Doylene Canning, [place] Behavioral Health Coordinator [phone]

## 2021-08-12 NOTE — ED Notes (Signed)
Patient c/o anxiety, CIWA flowsheet done

## 2021-08-12 NOTE — Progress Notes (Signed)
Per Day shift [facility] AC [name] pt will transfer to [facility] tomorrow 08/13/21. Admissions was delayed due to staffing. [facility] AC communicated with ED charge RN. Care team was updated.  Maryjean Ka, MSW, [facility] 08/12/2021 10:19 PM

## 2021-08-12 NOTE — ED Notes (Signed)
Pt currently denies any needs or complaints at this time. 

## 2021-08-13 ENCOUNTER — Other Ambulatory Visit: Payer: Self-pay

## 2021-08-13 ENCOUNTER — Encounter (HOSPITAL_COMMUNITY): Payer: Self-pay | Admitting: Adult Health

## 2021-08-13 ENCOUNTER — Encounter (HOSPITAL_COMMUNITY): Payer: Self-pay

## 2021-08-13 ENCOUNTER — Inpatient Hospital Stay (HOSPITAL_COMMUNITY)
Admission: AD | Admit: 2021-08-13 | Discharge: 2021-08-16 | DRG: 897 | Disposition: A | Discharge status: Home / Self Care | Payer: Federal, State, Local not specified - Other | Source: Intra-hospital | Attending: Psychiatry | Admitting: Psychiatry

## 2021-08-13 DIAGNOSIS — R45851 Suicidal ideations: Secondary | ICD-10-CM | POA: Diagnosis present

## 2021-08-13 DIAGNOSIS — R9431 Abnormal electrocardiogram [ECG] [EKG]: Secondary | ICD-10-CM | POA: Diagnosis present

## 2021-08-13 DIAGNOSIS — R630 Anorexia: Secondary | ICD-10-CM | POA: Diagnosis present

## 2021-08-13 DIAGNOSIS — Z20822 Contact with and (suspected) exposure to covid-19: Secondary | ICD-10-CM | POA: Diagnosis present

## 2021-08-13 DIAGNOSIS — Z91148 Patient's other noncompliance with medication regimen for other reason: Secondary | ICD-10-CM

## 2021-08-13 DIAGNOSIS — G4700 Insomnia, unspecified: Secondary | ICD-10-CM | POA: Diagnosis present

## 2021-08-13 DIAGNOSIS — F10239 Alcohol dependence with withdrawal, unspecified: Secondary | ICD-10-CM | POA: Diagnosis present

## 2021-08-13 DIAGNOSIS — F1994 Other psychoactive substance use, unspecified with psychoactive substance-induced mood disorder: Secondary | ICD-10-CM | POA: Diagnosis present

## 2021-08-13 DIAGNOSIS — F1020 Alcohol dependence, uncomplicated: Principal | ICD-10-CM | POA: Diagnosis present

## 2021-08-13 DIAGNOSIS — F5105 Insomnia due to other mental disorder: Secondary | ICD-10-CM | POA: Diagnosis present

## 2021-08-13 DIAGNOSIS — F332 Major depressive disorder, recurrent severe without psychotic features: Secondary | ICD-10-CM | POA: Diagnosis not present

## 2021-08-13 DIAGNOSIS — F411 Generalized anxiety disorder: Secondary | ICD-10-CM | POA: Diagnosis present

## 2021-08-13 LAB — VITAMIN D 25 HYDROXY (VIT D DEFICIENCY, FRACTURES): Vit D, 25-Hydroxy: 23.01 ng/mL — ABNORMAL LOW (ref 30–100)

## 2021-08-13 LAB — LIPID PANEL
Cholesterol: 193 mg/dL (ref 0–200)
HDL: 76 mg/dL (ref >40)
LDL Cholesterol: 97 mg/dL (ref 0–99)
Total CHOL/HDL Ratio: 2.5 RATIO
Triglycerides: 101 mg/dL (ref <150)
VLDL: 20 mg/dL (ref 0–40)

## 2021-08-13 LAB — HEMOGLOBIN A1C
Hgb A1c MFr Bld: 4.7 % — ABNORMAL LOW (ref 4.8–5.6)
Mean Plasma Glucose: 88.19 mg/dL

## 2021-08-13 LAB — VITAMIN B12: Vitamin B-12: 265 pg/mL (ref 180–914)

## 2021-08-13 LAB — MAGNESIUM: Magnesium: 2 mg/dL (ref 1.7–2.4)

## 2021-08-13 LAB — TSH: TSH: 1.345 u[IU]/mL (ref 0.350–4.500)

## 2021-08-13 LAB — POTASSIUM: Potassium: 3.1 mmol/L — ABNORMAL LOW (ref 3.5–5.1)

## 2021-08-13 MED ORDER — LORAZEPAM 1 MG PO TABS
0.0000 mg | ORAL_TABLET | Freq: Two times a day (BID) | ORAL | Status: AC
  Administered 2021-08-14 – 2021-08-15 (×2): 1 mg via ORAL
  Filled 2021-08-13 (×2): qty 1

## 2021-08-13 MED ORDER — OLANZAPINE 5 MG PO TBDP
5.0000 mg | ORAL_TABLET | Freq: Three times a day (TID) | ORAL | Status: DC | PRN
  Administered 2021-08-14 (×2): 5 mg via ORAL
  Filled 2021-08-13 (×2): qty 1

## 2021-08-13 MED ORDER — LORAZEPAM 1 MG PO TABS
0.0000 mg | ORAL_TABLET | Freq: Four times a day (QID) | ORAL | Status: AC
  Administered 2021-08-13 – 2021-08-14 (×3): 1 mg via ORAL
  Filled 2021-08-13 (×4): qty 1

## 2021-08-13 MED ORDER — QUETIAPINE FUMARATE 50 MG PO TABS
50.0000 mg | ORAL_TABLET | Freq: Every day | ORAL | Status: DC
  Filled 2021-08-13 (×2): qty 1

## 2021-08-13 MED ORDER — HYDROXYZINE HCL 50 MG PO TABS
50.0000 mg | ORAL_TABLET | Freq: Three times a day (TID) | ORAL | Status: DC | PRN
  Administered 2021-08-13 – 2021-08-15 (×4): 50 mg via ORAL
  Filled 2021-08-13 (×4): qty 1
  Filled 2021-08-13: qty 10

## 2021-08-13 MED ORDER — LORAZEPAM 2 MG/ML IJ SOLN: 0.0000 mg | Freq: Four times a day (QID) | INTRAMUSCULAR | Status: DC

## 2021-08-13 MED ORDER — MIRTAZAPINE 15 MG PO TABS
15.0000 mg | ORAL_TABLET | Freq: Every day | ORAL | Status: DC
  Administered 2021-08-13 – 2021-08-15 (×3): 15 mg via ORAL
  Filled 2021-08-13 (×3): qty 1
  Filled 2021-08-13: qty 7
  Filled 2021-08-13 (×3): qty 1

## 2021-08-13 MED ORDER — GABAPENTIN 300 MG PO CAPS
300.0000 mg | ORAL_CAPSULE | Freq: Three times a day (TID) | ORAL | Status: DC
  Administered 2021-08-13 – 2021-08-15 (×7): 300 mg via ORAL
  Filled 2021-08-13 (×12): qty 1

## 2021-08-13 MED ORDER — LORAZEPAM 2 MG/ML IJ SOLN: 0.0000 mg | Freq: Two times a day (BID) | INTRAMUSCULAR | Status: DC

## 2021-08-13 MED ORDER — SERTRALINE HCL 50 MG PO TABS
50.0000 mg | ORAL_TABLET | Freq: Every day | ORAL | Status: DC
  Filled 2021-08-13 (×3): qty 1

## 2021-08-13 MED ORDER — THIAMINE HCL 100 MG PO TABS
100.0000 mg | ORAL_TABLET | Freq: Every day | ORAL | Status: DC
  Administered 2021-08-13 – 2021-08-16 (×4): 100 mg via ORAL
  Filled 2021-08-13 (×7): qty 1

## 2021-08-13 MED ORDER — OLANZAPINE 10 MG IM SOLR: 5.0000 mg | Freq: Three times a day (TID) | INTRAMUSCULAR | Status: DC | PRN

## 2021-08-13 MED ORDER — THIAMINE HCL 100 MG/ML IJ SOLN: 100.0000 mg | Freq: Every day | INTRAMUSCULAR | Status: DC

## 2021-08-13 MED ORDER — LORAZEPAM 1 MG PO TABS: 1.0000 mg | ORAL_TABLET | ORAL | Status: DC | PRN

## 2021-08-13 MED ORDER — ADULT MULTIVITAMIN W/MINERALS CH
1.0000 | ORAL_TABLET | Freq: Every day | ORAL | Status: DC
  Administered 2021-08-13 – 2021-08-16 (×4): 1 via ORAL
  Filled 2021-08-13 (×7): qty 1

## 2021-08-13 NOTE — BH IP Treatment Plan (Signed)
Interdisciplinary Treatment and Diagnostic Plan Update  08/13/2021 Time of Session: 9:15am  Thomas [name] MRN: [id number]  Principal Diagnosis: MDD (major depressive disorder), recurrent severe, without psychosis ([place])  Secondary Diagnoses: Principal Problem:   MDD (major depressive disorder), recurrent severe, without psychosis ([place]) Active Problems:   GAD (generalized anxiety disorder)   Insomnia due to mental condition   Substance induced mood disorder ([place])   Alcohol use disorder, severe, dependence ([place])   QT prolongation   Current Medications:  Current Facility-Administered Medications  Medication Dose Route Frequency Provider Last Rate Last Admin   gabapentin (NEURONTIN) capsule 300 mg  300 mg Oral TID Merlyn Lot E, NP   300 mg at 08/13/21 1302   hydrOXYzine (ATARAX) tablet 50 mg  50 mg Oral TID PRN Maida Sale, MD       LORazepam (ATIVAN) tablet 0-4 mg  0-4 mg Oral Q6H Merlyn Lot E, NP   1 mg at 08/13/21 1302   [START ON 08/14/2021] LORazepam (ATIVAN) tablet 0-4 mg  0-4 mg Oral Q12H Merlyn Lot E, NP       LORazepam (ATIVAN) tablet 1 mg  1 mg Oral Q4H PRN Hill, Jackie Plum, MD       mirtazapine (REMERON) tablet 15 mg  15 mg Oral QHS Hill, Jackie Plum, MD       multivitamin with minerals tablet 1 tablet  1 tablet Oral Daily Merlyn Lot E, NP   1 tablet at 08/13/21 1143   OLANZapine zydis (ZYPREXA) disintegrating tablet 5 mg  5 mg Oral TID PRN Maida Sale, MD       Or   OLANZapine (ZYPREXA) injection 5 mg  5 mg Intramuscular TID PRN Maida Sale, MD       thiamine (VITAMIN B1) tablet 100 mg  100 mg Oral Daily Merlyn Lot E, NP   100 mg at 08/13/21 1143   PTA Medications: Medications Prior to Admission  Medication Sig Dispense Refill Last Dose   gabapentin (NEURONTIN) 300 MG capsule Take 1 capsule (300 mg total) by mouth 3 (three) times daily. (Patient not taking: Reported on 08/11/2021) 90 capsule 0    Multiple Vitamin  (MULTIVITAMIN WITH MINERALS) TABS tablet Take 1 tablet by mouth daily. (Patient not taking: Reported on 08/11/2021) 30 tablet 0    QUEtiapine (SEROQUEL) 50 MG tablet Take 1 tablet (50 mg total) by mouth at bedtime. (Patient not taking: Reported on 08/11/2021) 30 tablet 0    sertraline (ZOLOFT) 50 MG tablet Take 1 tablet (50 mg total) by mouth daily. (Patient not taking: Reported on 08/11/2021) 30 tablet 0     Patient Stressors: Financial difficulties   Occupational concerns   Substance abuse    Patient Strengths: General fund of knowledge   Treatment Modalities: Medication Management, Group therapy, Case management,  1 to 1 session with clinician, Psychoeducation, Recreational therapy.   Physician Treatment Plan for Primary Diagnosis: MDD (major depressive disorder), recurrent severe, without psychosis ([place]) Long Term Goal(s):     Short Term Goals:    Medication Management: Evaluate patient's response, side effects, and tolerance of medication regimen.  Therapeutic Interventions: 1 to 1 sessions, Unit Group sessions and Medication administration.  Evaluation of Outcomes: Not Met  Physician Treatment Plan for Secondary Diagnosis: Principal Problem:   MDD (major depressive disorder), recurrent severe, without psychosis ([place]) Active Problems:   GAD (generalized anxiety disorder)   Insomnia due to mental condition   Substance induced mood disorder ([facility])   Alcohol use disorder, severe,  dependence ([facility])   QT prolongation  Long Term Goal(s):     Short Term Goals:       Medication Management: Evaluate patient's response, side effects, and tolerance of medication regimen.  Therapeutic Interventions: 1 to 1 sessions, Unit Group sessions and Medication administration.  Evaluation of Outcomes: Not Met   RN Treatment Plan for Primary Diagnosis: MDD (major depressive disorder), recurrent severe, without psychosis ([place]) Long Term Goal(s): Knowledge of disease and therapeutic regimen to  maintain health will improve  Short Term Goals: Ability to remain free from injury will improve, Ability to participate in decision making will improve, Ability to verbalize feelings will improve, Ability to disclose and discuss suicidal ideas, and Ability to identify and develop effective coping behaviors will improve  Medication Management: RN will administer medications as ordered by provider, will assess and evaluate patient's response and provide education to patient for prescribed medication. RN will report any adverse and/or side effects to prescribing provider.  Therapeutic Interventions: 1 on 1 counseling sessions, Psychoeducation, Medication administration, Evaluate responses to treatment, Monitor vital signs and CBGs as ordered, Perform/monitor CIWA, COWS, AIMS and Fall Risk screenings as ordered, Perform wound care treatments as ordered.  Evaluation of Outcomes: Not Met   LCSW Treatment Plan for Primary Diagnosis: MDD (major depressive disorder), recurrent severe, without psychosis ([place]) Long Term Goal(s): Safe transition to appropriate next level of care at discharge, Engage patient in therapeutic group addressing interpersonal concerns.  Short Term Goals: Engage patient in aftercare planning with referrals and resources, Increase social support, Increase emotional regulation, Facilitate acceptance of mental health diagnosis and concerns, Identify triggers associated with mental health/substance abuse issues, and Increase skills for wellness and recovery  Therapeutic Interventions: Assess for all discharge needs, 1 to 1 time with Social worker, Explore available resources and support systems, Assess for adequacy in community support network, Educate family and significant other(s) on suicide prevention, Complete Psychosocial Assessment, Interpersonal group therapy.  Evaluation of Outcomes: Not Met   Progress in Treatment: Attending groups: Yes. Participating in groups:  Yes. Taking medication as prescribed: Yes. Toleration medication: Yes. Family/Significant other contact made: Yes, individual(s) contacted:  If patient provides consents  Patient understands diagnosis: Yes. Discussing patient identified problems/goals with staff: Yes. Medical problems stabilized or resolved: Yes. Denies suicidal/homicidal ideation: Yes. Issues/concerns per patient self-inventory: No.   New problem(s) identified: No, Describe:  None   New Short Term/Long Term Goal(s): medication stabilization, elimination of SI thoughts, development of comprehensive mental wellness plan.   Patient Goals: "Better manage my anxiety and to get adjustments on my medications"  Discharge Plan or Barriers: Patient recently admitted. CSW will continue to follow and assess for appropriate referrals and possible discharge planning.   Reason for Continuation of Hospitalization: Anxiety Depression Medication stabilization Suicidal ideation Withdrawal symptoms  Estimated Length of Stay: 3 to 7 days   Last [place] Suicide Severity Risk Score: [place] Admission (Current) from 08/13/2021 in [facility] ED from 08/11/2021 in [facility] ED from 08/05/2021 in [facility]  C-SSRS RISK CATEGORY Low Risk High Risk High Risk       Last PHQ 2/9 Scores:    06/23/2021    2:32 PM 06/20/2021    2:36 PM 06/20/2021    5:03 AM  Depression screen PHQ 2/9  Decreased Interest 2 2 2   Down, Depressed, Hopeless 2 1 3   PHQ - 2 Score 4 3 5   Altered sleeping 2 1 3  Tired, decreased energy 2 2 2   Change in appetite 2 2 3   Feeling bad or failure about yourself  2 2 2   Trouble concentrating 1 1 2   Moving slowly or fidgety/restless 1 1 2   Suicidal thoughts 0 0 1  PHQ-9 Score 14 12 20   Difficult doing work/chores Somewhat difficult  Very difficult    Scribe for Treatment Team: Darleen Crocker,  Latanya Presser 08/13/2021 2:57 PM

## 2021-08-13 NOTE — [facility] Suicide Risk Assessment (Signed)
[facility] INPATIENT:  Family/Significant Other Suicide Prevention Education  Suicide Prevention Education:  Patient Refusal for Family/Significant Other Suicide Prevention Education: The patient Thomas [name] has refused to provide written consent for family/significant other to be provided Family/Significant Other Suicide Prevention Education during admission and/or prior to discharge.  Physician notified.  CSW completed SPE with patient. Discussed potential triggers leading to suicidal ideation in addition to coping skills one might use in order to delay and distract self from self harming behaviors. CSW encouraged patient to utilize emergency services if they felt unable to maintain their safety. SPE flyer provided to patient at this time.   Corky Crafts 08/13/2021, 3:31 PM

## 2021-08-13 NOTE — Plan of Care (Signed)
  Problem: Education: Goal: Knowledge of [facility] Education information/materials will improve Outcome: Progressing Goal: Emotional status will improve Outcome: Progressing Goal: Mental status will improve Outcome: Progressing Goal: Verbalization of understanding the information provided will improve Outcome: Progressing   Problem: Activity: Goal: Interest or engagement in activities will improve Outcome: Progressing Goal: Sleeping patterns will improve Outcome: Progressing   Problem: Coping: Goal: Ability to verbalize frustrations and anger appropriately will improve Outcome: Progressing Goal: Ability to demonstrate self-control will improve Outcome: Progressing   Problem: Health Behavior/Discharge Planning: Goal: Identification of resources available to assist in meeting health care needs will improve Outcome: Progressing Goal: Compliance with treatment plan for underlying cause of condition will improve Outcome: Progressing   Problem: Physical Regulation: Goal: Ability to maintain clinical measurements within normal limits will improve Outcome: Progressing   Problem: Safety: Goal: Periods of time without injury will increase Outcome: Progressing   

## 2021-08-13 NOTE — Progress Notes (Signed)
Pt attended NA group

## 2021-08-13 NOTE — Group Note (Signed)
[facility] LCSW Group Therapy Note   Group Date: 08/13/2021 Start Time: 1300 End Time: 1400   Type of Therapy and Topic: Group Therapy: Avoiding Self-Sabotaging and Enabling Behaviors  Participation Level: Active  Mood: depressed   Description of Group:  In this group, patients will learn how to identify obstacles, self-sabotaging and enabling behaviors, as well as: what are they, why do we do them and what needs these behaviors meet. Discuss unhealthy relationships and how to have positive healthy boundaries with those that sabotage and enable. Explore aspects of self-sabotage and enabling in yourself and how to limit these self-destructive behaviors in everyday life.   Therapeutic Goals: 1. Patient will identify one obstacle that relates to self-sabotage and enabling behaviors 2. Patient will identify one personal self-sabotaging or enabling behavior they did prior to admission 3. Patient will state a plan to change the above identified behavior 4. Patient will demonstrate ability to communicate their needs through discussion and/or role play.    Summary of Patient Progress:    Patient was present for the entirety of the group session. Patient was an active listener and participated in the topic of discussion, provided helpful advice to others, and added nuance to topic of conversation. Patient shared he has difficulty moderating his alcohol use, often discharges from the hospital and relapses after a couple of days. CSW discussed treatment options with patient, shared slight interest in attending treatment.    Therapeutic Modalities:  Cognitive Behavioral Therapy Person-Centered Therapy Motivational Interviewing    Corky Crafts, [place]
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
## 2021-08-13 NOTE — Tx Team (Addendum)
Initial Treatment Plan 08/13/2021 11:19 AM [name] [id number]    PATIENT STRESSORS: Financial difficulties   Occupational concerns   Substance abuse     PATIENT STRENGTHS: General fund of knowledge    PATIENT IDENTIFIED PROBLEMS: Suicidal Ideation  Substance Abuse                   DISCHARGE CRITERIA:  Ability to meet basic life and health needs Adequate post-discharge living arrangements Improved stabilization in mood, thinking, and/or behavior  PRELIMINARY DISCHARGE PLAN: Outpatient therapy Return to previous living arrangement Return to previous work or school arrangements  PATIENT/FAMILY INVOLVEMENT: This treatment plan has been presented to and reviewed with the patient, Thomas [name], and/or family member, .  The patient and family have been given the opportunity to ask questions and make suggestions.  Virgel Paling, RN 08/13/2021, 11:19 AM
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
## 2021-08-13 NOTE — Progress Notes (Deleted)
Pt appears medication focused    08/13/21 2200  Psych Admission Type (Psych Patients Only)  Admission Status Involuntary  Psychosocial Assessment  Patient Complaints Anxiety  Eye Contact Fair  Facial Expression Flat  Affect Anxious  Speech Logical/coherent  Interaction Assertive  Motor Activity Restless  Appearance/Hygiene Disheveled  Behavior Characteristics Cooperative  Mood Depressed  Aggressive Behavior  Effect No apparent injury  Thought Process  Coherency WDL  Content WDL  Delusions WDL  Perception WDL  Hallucination None reported or observed  Judgment WDL  Confusion None  Danger to Self  Current suicidal ideation? Denies  Danger to Others  Danger to Others None reported or observed

## 2021-08-13 NOTE — Group Note (Signed)
Date:  08/13/2021 Time:  10:29 AM  Group Topic/Focus:  Orientation:   The focus of this group is to educate the patient on the purpose and policies of crisis stabilization and provide a format to answer questions about their admission.  The group details unit policies and expectations of patients while admitted.    Participation Level:  None  Participation Quality:  Inattentive  Affect:  Flat  Cognitive:  Lacking  Insight: Limited  Engagement in Group:  Poor  Modes of Intervention:  Discussion  Additional Comments:     [name] 08/13/2021, 10:29 AM
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
## 2021-08-13 NOTE — Progress Notes (Signed)
[facility]/BM[facility] LCSW Progress Note   08/13/2021    8:43 PM  Thomas [name]   [id number]   Type of Contact and Topic:  Care Coordination   CSW discussed alcohol treatment options with patient, currently interested in sober living environments ([facility] and [facility]). Patient provided with contact information and encouraged to reach out to facilities to get more information. CSW to follow up at a later time. Situation ongoing, CSW will continue to monitor and update note as more information becomes available.      Signed:  Corky Crafts, MSW, [place], LCAS 08/13/2021 8:43 PM

## 2021-08-13 NOTE — Progress Notes (Signed)
Pt seems medication focused   08/13/21 2200  Psych Admission Type (Psych Patients Only)  Admission Status Involuntary  Psychosocial Assessment  Patient Complaints Anxiety  Eye Contact Fair  Facial Expression Flat  Affect Anxious  Speech Logical/coherent  Interaction Assertive  Motor Activity Restless  Appearance/Hygiene Disheveled  Behavior Characteristics Cooperative  Mood Depressed  Aggressive Behavior  Effect No apparent injury  Thought Process  Coherency WDL  Content WDL  Delusions WDL  Perception WDL  Hallucination None reported or observed  Judgment WDL  Confusion None  Danger to Self  Current suicidal ideation? Passive  Self-Injurious Behavior Some self-injurious ideation observed or expressed.  No lethal plan expressed   Agreement Not to Harm Self Yes  Description of Agreement verbal contract for safety  Danger to Others  Danger to Others None reported or observed

## 2021-08-14 LAB — RPR: RPR Ser Ql: NONREACTIVE

## 2021-08-14 NOTE — [facility] Group Notes (Signed)
The focus of this group is to help patients review their daily goal of treatment and discuss progress on daily workbooks. Pt was attentive and appropriate during tonight's wrap up group. Pt stated that he was able to get some rest today. Along with having an overall good day mentally with much less anxiety/panic attacks today.
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
## 2021-08-14 NOTE — Progress Notes (Signed)
Pt observed to be guarded, irritable and anxious on interactions. Denies SI, HI, AVH and pain when assessed. Noted pacing in hall, demanding discharge. Verbal education provided on admission /discharge requirements to ensure compliance. Safety checks maintained at Q 15 minutes intervals without self harm gestures or outburst. Verbal education provided on all medications and effects monitored. Emotional support, encouragement and reassurance provided to pt. Tolerates medications, meals and fluids well. Off unit for meals, fresh air break and returned without issues. Denies concerns at this time.

## 2021-08-14 NOTE — Progress Notes (Signed)
Pt up requesting something to help him sleep, pt a little agitated, pt given PRN medication per [facility]

## 2021-08-14 NOTE — [facility] Group Notes (Signed)
Adult Psychoeducational Group Note  Date:  08/14/2021 Time:  9:53 AM  Group Topic/Focus:  Goals Group:   The focus of this group is to help patients establish daily goals to achieve during treatment and discuss how the patient can incorporate goal setting into their daily lives to aide in recovery.  Participation Level:  Did Not Attend    Thomas [name] 08/14/2021, 9:53 AM

## 2021-08-15 MED ORDER — TRAZODONE HCL 50 MG PO TABS
50.0000 mg | ORAL_TABLET | Freq: Every day | ORAL | Status: DC
  Administered 2021-08-15: 50 mg via ORAL
  Filled 2021-08-15: qty 7
  Filled 2021-08-15 (×3): qty 1

## 2021-08-15 MED ORDER — GABAPENTIN 300 MG PO CAPS
300.0000 mg | ORAL_CAPSULE | Freq: Three times a day (TID) | ORAL | Status: DC
  Administered 2021-08-15 – 2021-08-16 (×3): 300 mg via ORAL
  Filled 2021-08-15 (×4): qty 1
  Filled 2021-08-15 (×4): qty 28
  Filled 2021-08-15 (×4): qty 1

## 2021-08-15 NOTE — Group Note (Signed)
Date:  08/15/2021 Time:  10:08 AM  Group Topic/Focus:  Orientation:   The focus of this group is to educate the patient on the purpose and policies of crisis stabilization and provide a format to answer questions about their admission.  The group details unit policies and expectations of patients while admitted.    Participation Level:  Active  Participation Quality:  Appropriate  Affect:  Appropriate  Cognitive:  Alert  Insight: Appropriate  Engagement in Group:  Engaged  Modes of Intervention:  Discussion  Additional Comments:     [name] 08/15/2021, 10:08 AM

## 2021-08-15 NOTE — Progress Notes (Signed)
   08/15/21 2100  Psych Admission Type (Psych Patients Only)  Admission Status Involuntary  Psychosocial Assessment  Patient Complaints Anxiety  Eye Contact Brief  Facial Expression Sad  Affect Depressed  Speech Logical/coherent  Interaction Assertive  Motor Activity Fidgety  Appearance/Hygiene Disheveled  Behavior Characteristics Cooperative  Mood Depressed  Aggressive Behavior  Effect No apparent injury  Thought Process  Coherency WDL  Content WDL  Delusions None reported or observed  Perception WDL  Hallucination None reported or observed  Judgment WDL  Confusion WDL  Danger to Self  Current suicidal ideation? Denies  Danger to Others  Danger to Others None reported or observed

## 2021-08-15 NOTE — Group Note (Signed)
[facility] LCSW Group Therapy Note   Group Date: 08/15/2021 Start Time: 1300 End Time: 1400   Type of Therapy and Topic: Group Therapy: Avoiding Self-Sabotaging and Enabling Behaviors  Participation Level: Minimal  Description of Group:  In this group, patients will learn how to identify obstacles, self-sabotaging and enabling behaviors, as well as: what are they, why do we do them and what needs these behaviors meet. Discuss unhealthy relationships and how to have positive healthy boundaries with those that sabotage and enable. Explore aspects of self-sabotage and enabling in yourself and how to limit these self-destructive behaviors in everyday life.   Therapeutic Goals: 1. Patient will identify one obstacle that relates to self-sabotage and enabling behaviors 2. Patient will identify one personal self-sabotaging or enabling behavior they did prior to admission 3. Patient will state a plan to change the above identified behavior 4. Patient will demonstrate ability to communicate their needs through discussion and/or role play.    Summary of Patient Progress:   Patient was present for the entirety of group session. Patient participated in opening and closing remarks. However, patient did not contribute at all to the topic of discussion despite encouraged participation.    Therapeutic Modalities:  Cognitive Behavioral Therapy Person-Centered Therapy Motivational Interviewing    Corky Crafts, [place]

## 2021-08-15 NOTE — Progress Notes (Signed)
Pt denies SI/HI/AVH.  Pt endorses having anxiety because pt wants to be discharged.  Pt appears to have no signs or symptoms of alcohol withdrawal (other than anxiety).  RN will continue to monitor pt's progress and intervene as needed.
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
## 2021-08-15 NOTE — BHH Group Notes (Signed)
Adult Psychoeducational Group Note  Date:  08/15/2021 Time:  2:42 PM  Group Topic/Focus:  Managing Feelings:   The focus of this group is to identify what feelings patients have difficulty handling and develop a plan to handle them in a healthier way upon discharge.  Participation Level:  Active  Participation Quality:  Appropriate  Affect:  Appropriate  Cognitive:  Alert  Insight: Appropriate  Engagement in Group:  Engaged  Modes of Intervention:  Activity  Additional Comments:  Patient attended and participated in the emotional regulation group activity.  Jearl Klinefelter 08/15/2021, 2:42 PM

## 2021-08-16 DIAGNOSIS — F332 Major depressive disorder, recurrent severe without psychotic features: Secondary | ICD-10-CM

## 2021-08-16 MED ORDER — HYDROXYZINE HCL 50 MG PO TABS: 50.0000 mg | ORAL_TABLET | Freq: Three times a day (TID) | ORAL | 0 refills | Status: DC | PRN

## 2021-08-16 MED ORDER — TRAZODONE HCL 50 MG PO TABS
50.0000 mg | ORAL_TABLET | Freq: Every day | ORAL | 0 refills | Status: DC
Start: 2021-08-16 — End: 2021-11-21

## 2021-08-16 MED ORDER — MIRTAZAPINE 15 MG PO TABS: 15.0000 mg | ORAL_TABLET | Freq: Every day | ORAL | 0 refills | Status: DC

## 2021-08-16 MED ORDER — GABAPENTIN 300 MG PO CAPS: 300.0000 mg | ORAL_CAPSULE | Freq: Three times a day (TID) | ORAL | 0 refills | Status: DC

## 2021-08-16 NOTE — BHH Suicide Risk Assessment (Signed)
Suicide Risk Assessment  Discharge Assessment    [facility] Discharge Suicide Risk Assessment   Principal Problem: Alcohol use disorder, severe, dependence (HCC)  Discharge Diagnoses: Principal Problem:   Alcohol use disorder, severe, dependence (HCC) Active Problems:   GAD (generalized anxiety disorder)   MDD (major depressive disorder), recurrent severe, without psychosis (HCC)   Insomnia due to mental condition   Substance induced mood disorder (HCC)   QT prolongation  Total Time spent with patient:  35 minutes  Musculoskeletal: Strength & Muscle Tone: within normal limits Gait & Station: normal Patient leans: N/A  Psychiatric Specialty Exam  Presentation  General Appearance: Appropriate for Environment; Casual; Fairly Groomed  Eye Contact:Good  Speech:Clear and Coherent; Normal Rate  Speech Volume:Normal  Handedness:Right  Mood and Affect  Mood:Euthymic  Duration of Depression Symptoms: Greater than two weeks  Affect:Congruent; Appropriate  Thought Process  Thought Processes:Coherent; Goal Directed; Linear  Descriptions of Associations:Intact  Orientation:Full (Time, Place and Person)  Thought Content:Logical  History of Schizophrenia/Schizoaffective disorder:No  Duration of Psychotic Symptoms:No data recorded Hallucinations:Hallucinations: None  Ideas of Reference:None  Suicidal Thoughts:Suicidal Thoughts: No SI Passive Intent and/or Plan: Without Intent; Without Plan; Without Means to Carry Out; Without Access to Means  Homicidal Thoughts:Homicidal Thoughts: No  Sensorium  Memory:Immediate Good; Recent Good; Remote Good  Judgment:Good  Insight:Good; Present  Executive Functions  Concentration:Good  Attention Span:Good  Recall:Good  Fund of Knowledge:Good  Language:Good  Psychomotor Activity  Psychomotor Activity:Psychomotor Activity: Normal  Assets  Assets:Desire for Improvement; Manufacturing systems engineer; Housing; Resilience; Social  Support; Physical Health  Sleep  Sleep:Sleep: Good Number of Hours of Sleep: 7.5  Physical Exam: See H&P  Blood pressure 119/82, pulse (!) 110, temperature 98.3 F (36.8 C), temperature source Oral, resp. rate 18, height 6' (1.829 m), weight 73.6 kg, SpO2 98 %. Body mass index is 22.01 kg/m.  Mental Status Per Nursing Assessment::   On Admission:  Suicidal ideation indicated by patient  Demographic Factors:  Adolescent or young adult, Low socioeconomic status, and Unemployed  Loss Factors: NA  Historical Factors: Impulsivity  Risk Reduction Factors:   Sense of responsibility to family, Living with another person, especially a relative, Positive social support, Positive therapeutic relationship, and Positive coping skills or problem solving skills  Continued Clinical Symptoms:  Depression:   Impulsivity Alcohol/Substance Abuse/Dependencies More than one psychiatric diagnosis Previous Psychiatric Diagnoses and Treatments  Cognitive Features That Contribute To Risk:  Closed-mindedness, Polarized thinking, and Thought constriction (tunnel vision)    Suicide Risk:  Minimal: No identifiable suicidal ideation.  Patients presenting with no risk factors but with morbid ruminations; may be classified as minimal risk based on the severity of the depressive symptoms   Follow-up Information     [facility] Follow up.   Specialty: Behavioral Health Why: Please go to the [facility] to see Toy Cookey, NP, for your medication management.  Therapy is also available. Contact information: [phone] [place] [address] [phone]               Plan Of Care/Follow-up recommendations:  See the follow-up recommendation above.  Armandina Stammer, NP, [id number], fnp-bc 08/16/2021, 1:16 PM
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
## 2021-08-16 NOTE — Group Note (Signed)
Date:  08/16/2021 Time:  9:25 AM  Group Topic/Focus:  Goals Group:   The focus of this group is to help patients establish daily goals to achieve during treatment and discuss how the patient can incorporate goal setting into their daily lives to aide in recovery. Orientation:   The focus of this group is to educate the patient on the purpose and policies of crisis stabilization and provide a format to answer questions about their admission.  The group details unit policies and expectations of patients while admitted.    Participation Level:  Active  Participation Quality:  Appropriate  Affect:  Appropriate  Cognitive:  Appropriate  Insight: Appropriate  Engagement in Group:  Engaged  Modes of Intervention:  Discussion  Additional Comments:  Pt wants to have a better day than yesterday and focus on positive things to keep from drinking.  Jaquita Rector 08/16/2021, 9:25 AM

## 2021-08-16 NOTE — Progress Notes (Signed)
Pt presents with depressed mood, affect congruent. Pt states he is feeling better and ''i'm just ready to get out of here. I was at the ER for two days before coming here, and I've been here four days. I'm ready to go home. I know that I need to not go back to drinking, and I can go outpatient. I know that hasn't been helpful for me but I am ready to go from here before I lose my mind. '' Patient denies any SI or HI or A/V Hallucinations. Patient is safe, will con't to monitor. Denies any withdrawal symptoms. Above discussed in treatment team. Pt compliant with am medications, eating and drinking well. Pt is safe.
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
## 2021-08-16 NOTE — Progress Notes (Signed)
Order received for patients discharge. Patient denies any SI or HI or A/V Hallucinations. No signs of acute decompensation. Pt given Rx hard copies as well as free supply of medication. AVS reviewed at length including crisis resources. All pt belongings returned and pt escorted from unit to lobby to the care of family.

## 2021-08-16 NOTE — Progress Notes (Signed)
  [facility] Adult Case Management Discharge Plan :  Will you be returning to the same living situation after discharge:  No.  Going to stay with father At discharge, do you have transportation home?: Yes,  family Do you have the ability to pay for your medications: No.  Will need help from community agency  Release of information consent forms completed and emailed to Medical Records, then turned in to Medical Records by CSW.   Patient to Follow up at:  Follow-up Information     [facility] Follow up.   Specialty: Behavioral Health Why: Please go to the [facility] to see Toy Cookey, NP, for your medication management.  Therapy is also available. Contact information: [phone] [place] [address] [phone]                Next level of care provider has access to [facility] Link:yes  Safety Planning and Suicide Prevention discussed: No.  Declined by patient     Has patient been referred to the Quitline?: N/A patient is not a smoker  Patient has been referred for addiction treatment: Pt. refused referral  Lynnell Chad, LCSW 08/16/2021, 10:43 AM

## 2021-10-02 ENCOUNTER — Other Ambulatory Visit: Payer: Self-pay

## 2021-10-02 ENCOUNTER — Encounter (HOSPITAL_COMMUNITY): Payer: Self-pay | Admitting: Emergency Medicine

## 2021-10-02 ENCOUNTER — Emergency Department (HOSPITAL_COMMUNITY)
Admission: EM | Admit: 2021-10-02 | Discharge: 2021-10-02 | Discharge status: Against Medical Advice | Payer: Self-pay | Attending: Emergency Medicine | Admitting: Emergency Medicine

## 2021-10-02 DIAGNOSIS — F10929 Alcohol use, unspecified with intoxication, unspecified: Secondary | ICD-10-CM | POA: Insufficient documentation

## 2021-10-02 DIAGNOSIS — X58XXXA Exposure to other specified factors, initial encounter: Secondary | ICD-10-CM | POA: Insufficient documentation

## 2021-10-02 DIAGNOSIS — S0081XA Abrasion of other part of head, initial encounter: Secondary | ICD-10-CM | POA: Insufficient documentation
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
## 2021-10-02 NOTE — ED Provider Triage Note (Signed)
Emergency Medicine Provider Triage Evaluation Note  Thomas [name] , a 31 y.o. male  was evaluated in triage.  Pt complains of intoxication. He is presenting via EMS from home. He has an abrasion on his forehead. He does not remember falling. He has been drinking. Not on blood thinners. Reports no other medical problems. He wants to go home.    Review of Systems  Positive:  Negative:   Physical Exam  There were no vitals taken for this visit. Gen:   Awake, no distress   Resp:  Normal effort  MSK:   Moves extremities without difficulty  Other:  Abrasion to forehead  Medical Decision Making  Medically screening exam initiated at 7:43 PM.  Appropriate orders placed.  Mickel A Sigg was informed that the remainder of the evaluation will be completed by another provider, this initial triage assessment does not replace that evaluation, and the importance of remaining in the ED until their evaluation is complete.     Adolphus Birchwood, [place] 10/02/21 1944

## 2021-10-02 NOTE — ED Provider Notes (Signed)
[facility] EMERGENCY DEPARTMENT Provider Note   CSN: [id number] Arrival date & time: 10/02/21  1931     History PMH: MDD, GAD, ETOH use disorder Chief Complaint  Patient presents with   ETOH Intoxication     Thomas [name] is a 31 y.o. male. Pt complains of intoxication. He is presenting via EMS from home. He has an abrasion on his forehead. He does not remember falling. He has been drinking. Not on blood thinners. Reports no other medical problems. He wants to go home.   HPI     Home Medications Prior to Admission medications   Medication Sig Start Date End Date Taking? Authorizing Provider  gabapentin (NEURONTIN) 300 MG capsule Take 1 capsule (300 mg total) by mouth 4 (four) times daily - after meals and at bedtime. For anxiety/alcohol withdrawal 08/16/21   Lindell Spar I, NP  hydrOXYzine (ATARAX) 50 MG tablet Take 1 tablet (50 mg total) by mouth 3 (three) times daily as needed for anxiety (or sleep). 08/16/21   Lindell Spar I, NP  mirtazapine (REMERON) 15 MG tablet Take 1 tablet (15 mg total) by mouth at bedtime. For depression/sleep 08/16/21   Lindell Spar I, NP  Multiple Vitamin (MULTIVITAMIN WITH MINERALS) TABS tablet Take 1 tablet by mouth daily. Patient not taking: Reported on 08/11/2021 06/24/21   Ival Bible, MD  traZODone (DESYREL) 50 MG tablet Take 1 tablet (50 mg total) by mouth at bedtime. For sleep 08/16/21   Lindell Spar I, NP      Allergies    Patient has no known allergies.    Review of Systems   Review of Systems  All other systems reviewed and are negative.   Physical Exam Updated Vital Signs There were no vitals taken for this visit. Physical Exam Vitals and nursing note reviewed.  Constitutional:      General: He is not in acute distress.    Appearance: Normal appearance. He is well-developed. He is not ill-appearing, toxic-appearing or diaphoretic.  HENT:     Head: Normocephalic.     Comments: Abrasion on forehead     Nose: No nasal deformity.     Mouth/Throat:     Lips: Pink. No lesions.  Eyes:     General: Gaze aligned appropriately. No scleral icterus.       Right eye: No discharge.        Left eye: No discharge.     Conjunctiva/sclera: Conjunctivae normal.     Right eye: Right conjunctiva is not injected. No exudate or hemorrhage.    Left eye: Left conjunctiva is not injected. No exudate or hemorrhage. Pulmonary:     Effort: Pulmonary effort is normal. No respiratory distress.  Skin:    General: Skin is warm and dry.  Neurological:     General: No focal deficit present.     Mental Status: He is alert and oriented to person, place, and time.     Comments: Seems intoxicated  PERRLA. EOM intact. Equal motor strength in all ext. Gait without ataxia.  Psychiatric:        Mood and Affect: Mood normal.        Speech: Speech normal.        Behavior: Behavior normal. Behavior is cooperative.     ED Results / Procedures / Treatments   Labs (all labs ordered are listed, but only abnormal results are displayed) Labs Reviewed - No data to display  EKG None  Radiology No results found.  Procedures  Procedures   Medications Ordered in ED Medications - No data to display  ED Course/ Medical Decision Making/ A&P Clinical Course as of 10/02/21 2204  Thu Oct 02, 2021  2044 Refusing CT imaging.  [GL]    Clinical Course User Index [GL] Sherre Poot Adora Fridge, PA-C                           Medical Decision Making Amount and/or Complexity of Data Reviewed Radiology: ordered.   Patient is intoxicated. He has an abrasion to forehead and does not know how he got it. He has no focal deficits or neurological changes so lower suspicion for intracranial abnormality. I offered CT imaging of head, but he declines. He continues to request discharge. I have told him if he is able to find a ride, we can discharge him home. If not, he needs to wait until clinically sober.    Update: patient eloped from the  hospital.  Final Clinical Impression(s) / ED Diagnoses Final diagnoses:  None    Rx / DC Orders ED Discharge Orders     None         Adolphus Birchwood, PA-C 10/02/21 2204    Ezequiel Essex, MD 10/02/21 2241

## 2021-10-02 NOTE — ED Notes (Signed)
Unable to locate patient at triage/waiting area .  

## 2021-10-03 ENCOUNTER — Emergency Department (HOSPITAL_COMMUNITY)
Admission: EM | Admit: 2021-10-03 | Discharge: 2021-10-03 | Discharge status: Against Medical Advice | Payer: Self-pay | Attending: Emergency Medicine | Admitting: Emergency Medicine

## 2021-10-03 ENCOUNTER — Encounter (HOSPITAL_COMMUNITY): Payer: Self-pay | Admitting: Emergency Medicine

## 2021-10-03 ENCOUNTER — Emergency Department (HOSPITAL_COMMUNITY): Payer: Self-pay

## 2021-10-03 ENCOUNTER — Other Ambulatory Visit: Payer: Self-pay

## 2021-10-03 DIAGNOSIS — F10920 Alcohol use, unspecified with intoxication, uncomplicated: Secondary | ICD-10-CM | POA: Insufficient documentation
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
## 2021-10-03 NOTE — ED Triage Notes (Signed)
Pt BIB EMS.  Called by [employer] for patient being intoxicated.  Pt was at on [address]. No complaints

## 2021-10-03 NOTE — ED Notes (Signed)
Pt expressed his wishes to leave without CT scan.  EDPA and EDP MD aware.  Pt walked out of his own volition in no acute distress.

## 2021-10-04 ENCOUNTER — Encounter (HOSPITAL_COMMUNITY): Payer: Self-pay | Admitting: Emergency Medicine

## 2021-10-04 ENCOUNTER — Other Ambulatory Visit: Payer: Self-pay

## 2021-10-04 ENCOUNTER — Emergency Department (HOSPITAL_COMMUNITY)
Admission: EM | Admit: 2021-10-04 | Discharge: 2021-10-04 | Discharge status: Against Medical Advice | Payer: Self-pay | Attending: Emergency Medicine | Admitting: Emergency Medicine

## 2021-10-04 ENCOUNTER — Emergency Department (HOSPITAL_COMMUNITY)
Admission: EM | Admit: 2021-10-04 | Discharge: 2021-10-04 | Disposition: A | Discharge status: Home / Self Care | Payer: Self-pay | Attending: Emergency Medicine | Admitting: Emergency Medicine

## 2021-10-04 DIAGNOSIS — F1099 Alcohol use, unspecified with unspecified alcohol-induced disorder: Secondary | ICD-10-CM | POA: Insufficient documentation

## 2021-10-04 DIAGNOSIS — F411 Generalized anxiety disorder: Secondary | ICD-10-CM

## 2021-10-04 DIAGNOSIS — F419 Anxiety disorder, unspecified: Secondary | ICD-10-CM | POA: Insufficient documentation

## 2021-10-04 DIAGNOSIS — Y909 Presence of alcohol in blood, level not specified: Secondary | ICD-10-CM | POA: Insufficient documentation

## 2021-10-04 DIAGNOSIS — F1090 Alcohol use, unspecified, uncomplicated: Secondary | ICD-10-CM

## 2021-10-04 NOTE — ED Provider Notes (Signed)
[facility]-EMERGENCY DEPT Provider Note   CSN: [id number] Arrival date & time: 10/04/21  1526     History  Chief Complaint  Patient presents with   Alcohol Problem    Thomas [name] is a 31 y.o. male presenting with alcohol use disorder and anxiety.  I saw the patient for this yesterday and he eloped from the emergency department.  Did the same the day before.  He says that he is here because the police came to his house and brought him here.  Per nursing staff and EMS brought the patient here because at home he blew a 0.3 alcohol level   Alcohol Problem       Home Medications Prior to Admission medications   Medication Sig Start Date End Date Taking? Authorizing Provider  gabapentin (NEURONTIN) 300 MG capsule Take 1 capsule (300 mg total) by mouth 4 (four) times daily - after meals and at bedtime. For anxiety/alcohol withdrawal 08/16/21   Armandina Stammer I, NP  hydrOXYzine (ATARAX) 50 MG tablet Take 1 tablet (50 mg total) by mouth 3 (three) times daily as needed for anxiety (or sleep). 08/16/21   Armandina Stammer I, NP  mirtazapine (REMERON) 15 MG tablet Take 1 tablet (15 mg total) by mouth at bedtime. For depression/sleep 08/16/21   Armandina Stammer I, NP  Multiple Vitamin (MULTIVITAMIN WITH MINERALS) TABS tablet Take 1 tablet by mouth daily. Patient not taking: Reported on 08/11/2021 06/24/21   Estella Husk, MD  traZODone (DESYREL) 50 MG tablet Take 1 tablet (50 mg total) by mouth at bedtime. For sleep 08/16/21   Armandina Stammer I, NP      Allergies    Patient has no known allergies.    Review of Systems   Review of Systems  Physical Exam Updated Vital Signs BP (!) 132/94 (BP Location: Left Arm)   Pulse (!) 108   Temp 98.4 F (36.9 C) (Oral)   Resp 15   SpO2 97%  Physical Exam Vitals and nursing note reviewed.  Constitutional:      Appearance: Normal appearance.  HENT:     Head: Normocephalic and atraumatic.  Eyes:     General: No scleral  icterus.    Conjunctiva/sclera: Conjunctivae normal.  Pulmonary:     Effort: Pulmonary effort is normal. No respiratory distress.  Skin:    Findings: No rash.  Neurological:     Mental Status: He is alert.     Comments: Alert and oriented.  Psychiatric:        Mood and Affect: Mood normal.     ED Results / Procedures / Treatments   Labs (all labs ordered are listed, but only abnormal results are displayed) Labs Reviewed - No data to display  EKG None  Radiology No results found.  Procedures Procedures   Medications Ordered in ED Medications - No data to display  ED Course/ Medical Decision Making/ A&P                           Medical Decision Making  31 year old male with a past medical history of substance use disorder presenting today due to anxiety and alcohol use disorder.  Denies any SI/HI or AVH.  He has been seen twice in the past 2 days, once by me yesterday.  Each time he is eloped from the emergency department.  He was allegedly brought here by EMS after [employer] said that he was intoxicated and needed evaluation.  He is requesting discharge at this time.  He is clinically sober and I do not believe he needs a further work-up.  No sign of medical condition warranting emergent intervention today.  He will be given referral to [facility] for his anxiety and discharged home as per his request.   Final Clinical Impression(s) / ED Diagnoses Final diagnoses:  Alcohol use disorder  Anxiety state    Rx / DC Orders ED Discharge Orders     None     Results and diagnoses were explained to the patient. Return precautions discussed in full. Patient had no additional questions and expressed complete understanding.   This chart was dictated using voice recognition software.  Despite best efforts to proofread,  errors can occur which can change the documentation meaning.     Rhae Hammock, PA-C 10/04/21 1606    Godfrey Pick, MD 10/05/21 1309

## 2021-10-04 NOTE — ED Notes (Signed)
Pt ambulatory to waiting room. Pt verbalized understanding of discharge instructions.   

## 2021-10-04 NOTE — ED Notes (Signed)
Patient states he wants to go home. Has a ride coming to pick him up. Patient is able to answer questions and ambulated to the bathroom.

## 2021-10-04 NOTE — ED Notes (Addendum)
Pt refusing vitals and wants to go home

## 2021-10-04 NOTE — ED Notes (Addendum)
Pt ambulatory to the bathroom 

## 2021-10-04 NOTE — Discharge Instructions (Signed)
There is a behavioral health urgent care on [address].  I have attached their address and phone number.  This is a 24-hour clinic that you may walk into for any anxiety concerns.  Return with any further concerns or worsening symptoms.

## 2021-10-04 NOTE — ED Triage Notes (Signed)
Pt BIB [facility] with reports of alcohol intoxication and anxiety. Pt has no other complaints at this time. Pt alert and oriented x 4.

## 2021-10-05 ENCOUNTER — Emergency Department (HOSPITAL_COMMUNITY)
Admission: EM | Admit: 2021-10-05 | Discharge: 2021-10-05 | Discharge status: Against Medical Advice | Payer: Self-pay | Attending: Emergency Medicine | Admitting: Emergency Medicine

## 2021-10-05 ENCOUNTER — Encounter (HOSPITAL_COMMUNITY): Payer: Self-pay | Admitting: Emergency Medicine

## 2021-10-05 ENCOUNTER — Other Ambulatory Visit: Payer: Self-pay

## 2021-10-05 DIAGNOSIS — F10120 Alcohol abuse with intoxication, uncomplicated: Secondary | ICD-10-CM | POA: Insufficient documentation

## 2021-10-05 DIAGNOSIS — Y909 Presence of alcohol in blood, level not specified: Secondary | ICD-10-CM | POA: Insufficient documentation

## 2021-10-05 DIAGNOSIS — Z5321 Procedure and treatment not carried out due to patient leaving prior to being seen by health care provider: Secondary | ICD-10-CM | POA: Insufficient documentation

## 2021-10-05 NOTE — ED Provider Triage Note (Signed)
Emergency Medicine Provider Triage Evaluation Note  Thomas [name] , a 31 y.o. male  was evaluated in triage. Pt brought in by EMS for alcohol intoxication. This is his 5th visit in the past 4 days for same. Numerous ER visits for similar prior. Brought in today after his mother called 62. He was found in his front yard. He keeps repeating "I need help" and screaming.  Physical Exam  There were no vitals taken for this visit. Gen:   Awake, no distress   Resp:  Normal effort  MSK:   Moves extremities without difficulty  Other:  Covered in urine  Medical Decision Making  Medically screening exam initiated at 7:32 PM.  Appropriate orders placed.  Thomas [name] was informed that the remainder of the evaluation will be completed by another provider, this initial triage assessment does not replace that evaluation, and the importance of remaining in the ED until their evaluation is complete.     Kateri Plummer, Hershal Coria 10/05/21 1933

## 2021-10-05 NOTE — ED Notes (Signed)
Pt got up and walked out of triage

## 2021-10-05 NOTE — ED Triage Notes (Signed)
Pt here from his front yard via ems , soaked in his own urine , stated that he has been drinking all day , (24 beers every day ), pt has been here for same thing several times over the last 24 hours

## 2021-11-20 ENCOUNTER — Encounter (HOSPITAL_COMMUNITY): Payer: Self-pay

## 2021-11-20 ENCOUNTER — Other Ambulatory Visit: Payer: Self-pay

## 2021-11-20 ENCOUNTER — Observation Stay (HOSPITAL_COMMUNITY)
Admission: EM | Admit: 2021-11-20 | Discharge: 2021-11-21 | Disposition: A | Discharge status: Home / Self Care | Payer: Self-pay | Attending: Internal Medicine | Admitting: Internal Medicine

## 2021-11-20 DIAGNOSIS — F1010 Alcohol abuse, uncomplicated: Secondary | ICD-10-CM | POA: Insufficient documentation

## 2021-11-20 DIAGNOSIS — Y905 Blood alcohol level of 100-119 mg/100 ml: Secondary | ICD-10-CM | POA: Insufficient documentation

## 2021-11-20 DIAGNOSIS — R7989 Other specified abnormal findings of blood chemistry: Secondary | ICD-10-CM | POA: Insufficient documentation

## 2021-11-20 DIAGNOSIS — R251 Tremor, unspecified: Secondary | ICD-10-CM | POA: Insufficient documentation

## 2021-11-20 DIAGNOSIS — E871 Hypo-osmolality and hyponatremia: Secondary | ICD-10-CM | POA: Insufficient documentation

## 2021-11-20 DIAGNOSIS — E872 Acidosis, unspecified: Secondary | ICD-10-CM | POA: Insufficient documentation

## 2021-11-20 DIAGNOSIS — F10239 Alcohol dependence with withdrawal, unspecified: Principal | ICD-10-CM | POA: Insufficient documentation

## 2021-11-20 DIAGNOSIS — Z79899 Other long term (current) drug therapy: Secondary | ICD-10-CM | POA: Insufficient documentation

## 2021-11-20 DIAGNOSIS — R4589 Other symptoms and signs involving emotional state: Secondary | ICD-10-CM

## 2021-11-20 DIAGNOSIS — R45851 Suicidal ideations: Secondary | ICD-10-CM

## 2021-11-20 DIAGNOSIS — E876 Hypokalemia: Secondary | ICD-10-CM | POA: Insufficient documentation

## 2021-11-20 DIAGNOSIS — F10939 Alcohol use, unspecified with withdrawal, unspecified: Secondary | ICD-10-CM | POA: Diagnosis present

## 2021-11-20 LAB — COMPREHENSIVE METABOLIC PANEL
ALT: 232 U/L — ABNORMAL HIGH (ref 0–44)
AST: 229 U/L — ABNORMAL HIGH (ref 15–41)
Albumin: 4.3 g/dL (ref 3.5–5.0)
Alkaline Phosphatase: 88 U/L (ref 38–126)
Anion gap: 18 — ABNORMAL HIGH (ref 5–15)
BUN: 8 mg/dL (ref 6–20)
CO2: 25 mmol/L (ref 22–32)
Calcium: 8.7 mg/dL — ABNORMAL LOW (ref 8.9–10.3)
Chloride: 88 mmol/L — ABNORMAL LOW (ref 98–111)
Creatinine, Ser: 0.95 mg/dL (ref 0.61–1.24)
GFR, Estimated: >60 mL/min (ref >60)
Glucose, Bld: 273 mg/dL — ABNORMAL HIGH (ref 70–99)
Potassium: 3.3 mmol/L — ABNORMAL LOW (ref 3.5–5.1)
Sodium: 131 mmol/L — ABNORMAL LOW (ref 135–145)
Total Bilirubin: 0.8 mg/dL (ref 0.3–1.2)
Total Protein: 7.5 g/dL (ref 6.5–8.1)

## 2021-11-20 LAB — ACETAMINOPHEN LEVEL: Acetaminophen (Tylenol), Serum: <10 ug/mL — ABNORMAL LOW (ref 10–30)

## 2021-11-20 LAB — CBC
HCT: 41.6 % (ref 39.0–52.0)
Hemoglobin: 15.5 g/dL (ref 13.0–17.0)
MCH: 33.8 pg (ref 26.0–34.0)
MCHC: 37.3 g/dL — ABNORMAL HIGH (ref 30.0–36.0)
MCV: 90.6 fL (ref 80.0–100.0)
Platelets: 197 10*3/uL (ref 150–400)
RBC: 4.59 MIL/uL (ref 4.22–5.81)
RDW: 13.2 % (ref 11.5–15.5)
WBC: 8.2 10*3/uL (ref 4.0–10.5)
nRBC: 0 % (ref 0.0–0.2)

## 2021-11-20 LAB — ETHANOL
Alcohol, Ethyl (B): 102 mg/dL — ABNORMAL HIGH (ref <10)
Alcohol, Ethyl (B): 104 mg/dL — ABNORMAL HIGH (ref <10)

## 2021-11-20 LAB — RAPID URINE DRUG SCREEN, HOSP PERFORMED
Amphetamines: NOT DETECTED
Barbiturates: NOT DETECTED
Benzodiazepines: POSITIVE — AB
Cocaine: NOT DETECTED
Opiates: NOT DETECTED
Tetrahydrocannabinol: NOT DETECTED

## 2021-11-20 LAB — SALICYLATE LEVEL: Salicylate Lvl: <7 mg/dL — ABNORMAL LOW (ref 7.0–30.0)

## 2021-11-20 LAB — CK: Total CK: 317 U/L (ref 49–397)

## 2021-11-20 MED ORDER — POTASSIUM CHLORIDE 10 MEQ/100ML IV SOLN
10.0000 meq | Freq: Once | INTRAVENOUS | Status: AC
  Administered 2021-11-20: 10 meq via INTRAVENOUS
  Filled 2021-11-20: qty 100

## 2021-11-20 MED ORDER — LORAZEPAM 2 MG/ML IJ SOLN: 0.0000 mg | Freq: Two times a day (BID) | INTRAMUSCULAR | Status: DC

## 2021-11-20 MED ORDER — LORAZEPAM 2 MG/ML IJ SOLN
1.0000 mg | Freq: Once | INTRAMUSCULAR | Status: AC
  Administered 2021-11-20: 1 mg via INTRAVENOUS
  Filled 2021-11-20: qty 1

## 2021-11-20 MED ORDER — LORAZEPAM 2 MG/ML IJ SOLN: 1.0000 mg | Freq: Once | INTRAMUSCULAR | Status: DC

## 2021-11-20 MED ORDER — THIAMINE MONONITRATE 100 MG PO TABS: 100.0000 mg | ORAL_TABLET | Freq: Every day | ORAL | Status: DC

## 2021-11-20 MED ORDER — LORAZEPAM 1 MG PO TABS
0.0000 mg | ORAL_TABLET | Freq: Four times a day (QID) | ORAL | Status: DC
  Administered 2021-11-20: 1 mg via ORAL
  Filled 2021-11-20: qty 1

## 2021-11-20 MED ORDER — THIAMINE HCL 100 MG/ML IJ SOLN: 100.0000 mg | Freq: Every day | INTRAMUSCULAR | Status: DC

## 2021-11-20 MED ORDER — LORAZEPAM 2 MG/ML IJ SOLN
2.0000 mg | Freq: Once | INTRAMUSCULAR | Status: AC
  Administered 2021-11-21: 2 mg via INTRAVENOUS

## 2021-11-20 MED ORDER — LORAZEPAM 2 MG/ML IJ SOLN
1.0000 mg | Freq: Once | INTRAMUSCULAR | Status: AC
  Administered 2021-11-20: 1 mg via INTRAMUSCULAR
  Filled 2021-11-20: qty 1

## 2021-11-20 MED ORDER — THIAMINE HCL 100 MG/ML IJ SOLN
100.0000 mg | Freq: Once | INTRAMUSCULAR | Status: AC
  Administered 2021-11-20: 100 mg via INTRAVENOUS
  Filled 2021-11-20: qty 2

## 2021-11-20 MED ORDER — LORAZEPAM 2 MG/ML IJ SOLN
0.0000 mg | Freq: Four times a day (QID) | INTRAMUSCULAR | Status: DC
  Administered 2021-11-20: 4 mg via INTRAVENOUS
  Filled 2021-11-20: qty 2

## 2021-11-20 MED ORDER — SODIUM CHLORIDE 0.9 % IV BOLUS
1000.0000 mL | Freq: Once | INTRAVENOUS | Status: AC
  Administered 2021-11-20: 1000 mL via INTRAVENOUS

## 2021-11-20 MED ORDER — LORAZEPAM 1 MG PO TABS: 0.0000 mg | ORAL_TABLET | Freq: Two times a day (BID) | ORAL | Status: DC

## 2021-11-20 MED ORDER — LORAZEPAM 1 MG PO TABS: 1.0000 mg | ORAL_TABLET | Freq: Once | ORAL | Status: DC

## 2021-11-20 MED ORDER — MAGNESIUM SULFATE 2 GM/50ML IV SOLN
2.0000 g | Freq: Once | INTRAVENOUS | Status: AC
  Administered 2021-11-20: 2 g via INTRAVENOUS
  Filled 2021-11-20: qty 50

## 2021-11-20 NOTE — ED Notes (Signed)
AMBULATES TO RESTROOM WITHOUT DIFFICULTY CHANGED INTO DIAPER AND PURPLE SCRUBS

## 2021-11-20 NOTE — ED Triage Notes (Signed)
Patient arrived ambulatory to triage- patient walked himself back into triage area without being called, sat down in chair stating he was going to pass out and scared he is going to have a stroke. Patient then laid himself down on the floor and is crawling over the floor. Patient saturated in urine. Reports he is withdrawing from alcohol.

## 2021-11-20 NOTE — ED Notes (Signed)
Provider notified that the patient is requesting more ativan patient reports that he feels like he is going to die.

## 2021-11-20 NOTE — ED Notes (Signed)
PATIENT BELONGINGS PLACED IN ONE BELONGINGS BAG AND PUT IN PURPLE LOCKER ROOM

## 2021-11-20 NOTE — ED Notes (Signed)
DR Jodi Mourning NOTIFIED OF CIWA OF 20 AND NEED FOR ATIVAN

## 2021-11-20 NOTE — ED Provider Triage Note (Addendum)
Emergency Medicine Provider Triage Evaluation Note  Thomas [name] , a 31 y.o. male  was evaluated in triage.  Pt complains of alcohol withdrawal. Usually drinks 24 beers daily, last drank several hours ago. Feels like he's "going to have a stroke".   Review of Systems  Positive: Tremors, nausea Negative:   Physical Exam  BP 137/81 (BP Location: Left Arm)   Pulse (!) 131   Temp 98.1 F (36.7 C)   Resp (!) 21   SpO2 98%  Gen:   Awake, no distress   Resp:  Normal effort  MSK:   Moves extremities without difficulty  Other:  Smells of ETOH, slightly slurred speech  Medical Decision Making  Medically screening exam initiated at 2:24 PM.  Appropriate orders placed.  Thomas [name] was informed that the remainder of the evaluation will be completed by another provider, this initial triage assessment does not replace that evaluation, and the importance of remaining in the ED until their evaluation is complete.  This is patient's 17th visit for similar, most recently 10/1   Thomas [name] T, PA-C 11/20/21 1426  Turned around and patient was laying on the floor of triage. Smells of urine and alcohol, assisted into a room.    Thomas Monks, PA-C 11/20/21 1432

## 2021-11-20 NOTE — ED Provider Notes (Signed)
[facility] EMERGENCY DEPARTMENT Provider Note   CSN: [id number] Arrival date & time: 11/20/21  1404     History  Chief Complaint  Patient presents with   Anxiety   Alcohol Intoxication    Thomas [name] is a 31 y.o. male.  Patient presents with generalized shakiness and concerns for withdrawal from alcohol.  Patient's been drinking up to 24 beers a day for almost 1 year.  Last drink was at noon today.  Patient feels generally unwell, panic attacks, generalized shaking and nausea.  Patient feels like he is can have a stroke or seizure.  No history of alcohol withdrawal seizures, no hallucinations.  Patient denies any fevers vomiting or diarrhea.  Patient's been living in his car.       Home Medications Prior to Admission medications   Medication Sig Start Date End Date Taking? Authorizing Provider  gabapentin (NEURONTIN) 300 MG capsule Take 1 capsule (300 mg total) by mouth 4 (four) times daily - after meals and at bedtime. For anxiety/alcohol withdrawal Patient not taking: Reported on 11/20/2021 08/16/21   Armandina Stammer I, NP  hydrOXYzine (ATARAX) 50 MG tablet Take 1 tablet (50 mg total) by mouth 3 (three) times daily as needed for anxiety (or sleep). Patient not taking: Reported on 11/20/2021 08/16/21   Armandina Stammer I, NP  mirtazapine (REMERON) 15 MG tablet Take 1 tablet (15 mg total) by mouth at bedtime. For depression/sleep Patient not taking: Reported on 11/20/2021 08/16/21   Armandina Stammer I, NP  Multiple Vitamin (MULTIVITAMIN WITH MINERALS) TABS tablet Take 1 tablet by mouth daily. Patient not taking: Reported on 08/11/2021 06/24/21   Estella Husk, MD  traZODone (DESYREL) 50 MG tablet Take 1 tablet (50 mg total) by mouth at bedtime. For sleep Patient not taking: Reported on 11/20/2021 08/16/21   Armandina Stammer I, NP      Allergies    Patient has no known allergies.    Review of Systems   Review of Systems  Constitutional:  Positive for fatigue.  Negative for chills and fever.  HENT:  Negative for congestion.   Eyes:  Negative for visual disturbance.  Respiratory:  Negative for shortness of breath.   Cardiovascular:  Negative for chest pain.  Gastrointestinal:  Positive for nausea. Negative for abdominal pain and vomiting.  Genitourinary:  Negative for dysuria and flank pain.  Musculoskeletal:  Negative for back pain, neck pain and neck stiffness.  Skin:  Negative for rash.  Neurological:  Positive for tremors and light-headedness. Negative for seizures and headaches.  Psychiatric/Behavioral:  Positive for agitation and suicidal ideas. The patient is nervous/anxious.     Physical Exam Updated Vital Signs BP 116/65   Pulse (!) 101   Temp 97.7 F (36.5 C) (Oral)   Resp 14   SpO2 98%  Physical Exam Vitals and nursing note reviewed.  Constitutional:      General: He is not in acute distress.    Appearance: He is well-developed.  HENT:     Head: Normocephalic and atraumatic.     Comments: Dry mm    Mouth/Throat:     Mouth: Mucous membranes are moist.  Eyes:     General:        Right eye: No discharge.        Left eye: No discharge.     Conjunctiva/sclera: Conjunctivae normal.  Neck:     Trachea: No tracheal deviation.  Cardiovascular:     Rate and Rhythm: Regular rhythm. Tachycardia present.  Heart sounds: No murmur heard. Pulmonary:     Effort: Pulmonary effort is normal.     Breath sounds: Normal breath sounds.  Abdominal:     General: There is no distension.     Palpations: Abdomen is soft.     Tenderness: There is no abdominal tenderness. There is no guarding.  Musculoskeletal:     Cervical back: Normal range of motion and neck supple. No rigidity.  Skin:    General: Skin is warm.     Capillary Refill: Capillary refill takes less than 2 seconds.     Findings: No rash.  Neurological:     General: No focal deficit present.     Mental Status: He is alert.     Cranial Nerves: No cranial nerve deficit.      Comments: General shakiness, tremors in both arms with raising arms, general weakness.  Patient answers questions appropriate, no hallucinations.  Psychiatric:        Thought Content: Thought content includes suicidal ideation. Thought content includes suicidal plan.     Comments: Intermittent tremor, shakiness bilateral extremities.     ED Results / Procedures / Treatments   Labs (all labs ordered are listed, but only abnormal results are displayed) Labs Reviewed  COMPREHENSIVE METABOLIC PANEL - Abnormal; Notable for the following components:      Result Value   Sodium 131 (*)    Potassium 3.3 (*)    Chloride 88 (*)    Glucose, Bld 273 (*)    Calcium 8.7 (*)    AST 229 (*)    ALT 232 (*)    Anion gap 18 (*)    All other components within normal limits  ETHANOL - Abnormal; Notable for the following components:   Alcohol, Ethyl (B) 104 (*)    All other components within normal limits  CBC - Abnormal; Notable for the following components:   MCHC 37.3 (*)    All other components within normal limits  RAPID URINE DRUG SCREEN, HOSP PERFORMED - Abnormal; Notable for the following components:   Benzodiazepines POSITIVE (*)    All other components within normal limits  ETHANOL - Abnormal; Notable for the following components:   Alcohol, Ethyl (B) 102 (*)    All other components within normal limits  SALICYLATE LEVEL - Abnormal; Notable for the following components:   Salicylate Lvl <7.0 (*)    All other components within normal limits  ACETAMINOPHEN LEVEL - Abnormal; Notable for the following components:   Acetaminophen (Tylenol), Serum <10 (*)    All other components within normal limits  CK    EKG None  Radiology No results found.  Procedures .Critical Care  Performed by: Blane Ohara, MD Authorized by: Blane Ohara, MD   Critical care provider statement:    Critical care time (minutes):  30   Critical care start time:  11/20/2021 8:00 PM   Critical care end  time:  11/20/2021 8:30 PM   Critical care time was exclusive of:  Separately billable procedures and treating other patients and teaching time   Critical care was necessary to treat or prevent imminent or life-threatening deterioration of the following conditions:  Metabolic crisis   Critical care was time spent personally by me on the following activities:  Development of treatment plan with patient or surrogate, discussions with consultants, evaluation of patient's response to treatment, examination of patient, ordering and review of laboratory studies, ordering and review of radiographic studies, ordering and performing treatments and interventions, pulse oximetry  and re-evaluation of patient's condition     Medications Ordered in ED Medications  LORazepam (ATIVAN) injection 0-4 mg (4 mg Intravenous Given 11/20/21 2102)    Or  LORazepam (ATIVAN) tablet 0-4 mg ( Oral See Alternative 11/20/21 2102)  LORazepam (ATIVAN) injection 0-4 mg (has no administration in time range)    Or  LORazepam (ATIVAN) tablet 0-4 mg (has no administration in time range)  LORazepam (ATIVAN) injection 2 mg (has no administration in time range)  LORazepam (ATIVAN) injection 1 mg (1 mg Intramuscular Given 11/20/21 1447)  potassium chloride 10 mEq in 100 mL IVPB (0 mEq Intravenous Stopped 11/20/21 2101)  magnesium sulfate IVPB 2 g 50 mL (0 g Intravenous Stopped 11/20/21 2101)  sodium chloride 0.9 % bolus 1,000 mL (0 mLs Intravenous Stopped 11/20/21 2154)  thiamine (VITAMIN B1) injection 100 mg (100 mg Intravenous Given 11/20/21 1955)  LORazepam (ATIVAN) injection 1 mg (1 mg Intravenous Given 11/20/21 2126)  sodium chloride 0.9 % bolus 1,000 mL (1,000 mLs Intravenous New Bag/Given 11/20/21 2156)    ED Course/ Medical Decision Making/ A&P                           Medical Decision Making Amount and/or Complexity of Data Reviewed Labs: ordered.  Risk OTC drugs. Prescription drug management. Decision regarding  hospitalization.   Patient presents emergency room with clinical concern for alcohol withdrawal and metabolic concerns given heavy alcohol use recently, generalized weakness and generalized tremoring.  Patient had blood work ordered and independently reviewed by myself showing expected hyponatremia 132, potassium 3.3 secondary to decreased nutrition and increased alcohol use.  Patient's liver function was elevated without focal tenderness or fever both in the 200s.  Urine drug screen positive for benzos, patient was given 1 benzo dose in the ER.  Patient also suicidal with a plan likely secondary to months of alcohol abuse, living in his car.  Social concerns also with currently no safe place to go.   Patient tachycardic secondary to dehydration/withdrawal symptoms.  CIWA protocol and suicidal protocol ordered. IV fluids, IV potassium and IV magnesium ordered.  TTS consult ordered.  Patient improved with repeat IV Ativan and IV fluids.  Discussed with hospitalist for admission for alcohol withdrawal, electrolyte repletion and monitoring in addition to behavioral with assessment for suicidal ideation.        Final Clinical Impression(s) / ED Diagnoses Final diagnoses:  Suicidal behavior without attempted self-injury  Alcohol abuse  Tremor due to substance abuse  Hypokalemia  Hyponatremia    Rx / DC Orders ED Discharge Orders     None         Blane Ohara, MD 11/20/21 2316

## 2021-11-20 NOTE — ED Notes (Signed)
Pt calling out stating he's anxious and feeling suicidal. MD Jodi Mourning called to bedside. CIWA of 10. This RN dressed pt out in purple scrubs, new linen change, warm blankets given, and pt's belongings given to pt's RN at nursing station.

## 2021-11-21 ENCOUNTER — Other Ambulatory Visit (HOSPITAL_COMMUNITY): Payer: Self-pay

## 2021-11-21 DIAGNOSIS — R7989 Other specified abnormal findings of blood chemistry: Secondary | ICD-10-CM | POA: Insufficient documentation

## 2021-11-21 DIAGNOSIS — E876 Hypokalemia: Secondary | ICD-10-CM | POA: Insufficient documentation

## 2021-11-21 DIAGNOSIS — F32A Depression, unspecified: Secondary | ICD-10-CM

## 2021-11-21 DIAGNOSIS — E872 Acidosis, unspecified: Secondary | ICD-10-CM | POA: Insufficient documentation

## 2021-11-21 DIAGNOSIS — R45851 Suicidal ideations: Secondary | ICD-10-CM

## 2021-11-21 DIAGNOSIS — F1020 Alcohol dependence, uncomplicated: Secondary | ICD-10-CM

## 2021-11-21 DIAGNOSIS — F10930 Alcohol use, unspecified with withdrawal, uncomplicated: Secondary | ICD-10-CM

## 2021-11-21 DIAGNOSIS — F10939 Alcohol use, unspecified with withdrawal, unspecified: Secondary | ICD-10-CM | POA: Diagnosis present

## 2021-11-21 MED ORDER — CHLORDIAZEPOXIDE HCL 25 MG PO CAPS
25.0000 mg | ORAL_CAPSULE | Freq: Three times a day (TID) | ORAL | Status: DC
  Administered 2021-11-21: 25 mg via ORAL
  Filled 2021-11-21: qty 1

## 2021-11-21 MED ORDER — THIAMINE MONONITRATE 100 MG PO TABS
100.0000 mg | ORAL_TABLET | Freq: Every day | ORAL | Status: DC
  Administered 2021-11-21: 100 mg via ORAL
  Filled 2021-11-21: qty 1

## 2021-11-21 MED ORDER — LORAZEPAM 1 MG PO TABS
0.0000 mg | ORAL_TABLET | ORAL | Status: DC
  Administered 2021-11-21 (×2): 1 mg via ORAL
  Filled 2021-11-21 (×2): qty 1

## 2021-11-21 MED ORDER — FOLIC ACID 1 MG PO TABS
1.0000 mg | ORAL_TABLET | Freq: Every day | ORAL | 0 refills | Status: DC
  Filled 2021-11-21: qty 30, 30d supply, fill #0

## 2021-11-21 MED ORDER — SODIUM CHLORIDE 0.9 % IV SOLN: INTRAVENOUS | Status: DC

## 2021-11-21 MED ORDER — CHLORDIAZEPOXIDE HCL 25 MG PO CAPS
25.0000 mg | ORAL_CAPSULE | Freq: Once | ORAL | Status: AC
  Administered 2021-11-21: 25 mg via ORAL
  Filled 2021-11-21: qty 1

## 2021-11-21 MED ORDER — ADULT MULTIVITAMIN W/MINERALS CH
1.0000 | ORAL_TABLET | Freq: Every day | ORAL | 0 refills | Status: DC
  Filled 2021-11-21: qty 30, 30d supply, fill #0

## 2021-11-21 MED ORDER — CHLORDIAZEPOXIDE HCL 25 MG PO CAPS
ORAL_CAPSULE | ORAL | 0 refills | Status: DC
  Filled 2021-11-21: qty 18, 9d supply, fill #0

## 2021-11-21 MED ORDER — ADULT MULTIVITAMIN W/MINERALS CH
1.0000 | ORAL_TABLET | Freq: Every day | ORAL | Status: DC
  Administered 2021-11-21: 1 via ORAL
  Filled 2021-11-21: qty 1

## 2021-11-21 MED ORDER — LORAZEPAM 2 MG/ML IJ SOLN
0.0000 mg | INTRAMUSCULAR | Status: AC
  Administered 2021-11-21 (×2): 2 mg via INTRAVENOUS
  Administered 2021-11-21: 1 mg via INTRAVENOUS
  Filled 2021-11-21 (×4): qty 1

## 2021-11-21 MED ORDER — THIAMINE HCL 100 MG/ML IJ SOLN
100.0000 mg | Freq: Every day | INTRAMUSCULAR | Status: DC
  Filled 2021-11-21: qty 2

## 2021-11-21 MED ORDER — LORAZEPAM 2 MG/ML IJ SOLN: 0.0000 mg | INTRAMUSCULAR | Status: DC

## 2021-11-21 MED ORDER — FOLIC ACID 1 MG PO TABS
1.0000 mg | ORAL_TABLET | Freq: Every day | ORAL | Status: DC
  Administered 2021-11-21: 1 mg via ORAL
  Filled 2021-11-21: qty 1

## 2021-11-21 MED ORDER — THIAMINE HCL 100 MG PO TABS
100.0000 mg | ORAL_TABLET | Freq: Every day | ORAL | 0 refills | Status: DC
  Filled 2021-11-21: qty 30, 30d supply, fill #0

## 2021-11-21 MED ORDER — LORAZEPAM 1 MG PO TABS: 0.0000 mg | ORAL_TABLET | ORAL | Status: AC

## 2021-11-21 NOTE — Discharge Summary (Signed)
[facility] ED Discharge  11/21/2021 9:57 AM Thomas [name]  MRN:  [id number]  Principal Problem: Alcohol withdrawal [facility]) Discharge Diagnoses: Principal Problem:   Alcohol withdrawal (HCC) Active Problems:   Hypokalemia   Abnormal LFTs   Depression with suicidal ideation   Metabolic acidosis  Clinical Impression:  Final diagnoses:  Suicidal behavior without attempted self-injury  Alcohol abuse  Tremor due to substance abuse  Hypokalemia  Hyponatremia   Subjective: "I been drinking a lot since losing my job 8 months ago"  ED Assessment Time Calculation: Start Time: 0915 Stop Time: 0945 Total Time in Minutes (Assessment Completion): 30  Thomas [name], 31 year old, male seen today, face to face, per TTS consult, for psychiatric evaluation. Patient presented to [facility] yesterday with alcohol withdrawal symptoms after abruptly stopping alcohol intake. He reports he has drank socially for a while, however, 8 months ago, he began to drink due to sadness over the loss of his job and frustration that he has been unable to find another job he likes. He reports drinking an average of 10-20 beers per day. He reports that he has been living in his vehicle and with his mother depleting his savings to survive over the last several months. He would like to receive treatment for alcohol dependence, however, he has a court date scheduled for Monday due to legal charges he currently has pending (patient did not disclose the nature of the legal charges). He is open to seeking treatment after his legal situation is resolved and request resources. Today he denies suicidal ideations or thoughts of killing any one else. He endorses intermittent periods of anxiety, but endorses current shakiness is related to withdrawing from alcohol. Patient is asking if he will able to leave. This writer advised of her role as a psychiatric provider only and his medical provider would make that ultimate decision.    During evaluation Thomas [name] is laying in bed with HOB elevated with visible fine tremors although  no acute distress. He is alert, oriented x 4, anxious, cooperative, and attentive.  His  mood anxious  with congruent affect. He has normal speech, and appropriate behavior.  Objectively there is no evidence of psychosis/mania or delusional thinking.  Patient is able to converse coherently, goal directed thoughts, no distractibility, or pre-occupation.  He denies suicidal/self-harm/homicidal ideation. Patient is able to contract for safety and does not meet psychiatric inpatient criteria.      Past Psychiatric History:   Past Medical History:  Past Medical History:  Diagnosis Date   Anxiety     Past Surgical History:  Procedure Laterality Date   APPENDECTOMY      Social History:  Social History   Substance and Sexual Activity  Alcohol Use Yes   Alcohol/week: 140.0 standard drinks of alcohol   Types: 140 Cans of beer per week   Comment: reported 20+ beers/day     Social History   Substance and Sexual Activity  Drug Use No    Social History   Socioeconomic History   Marital status: Single    Spouse name: Not on file   Number of children: Not on file   Years of education: Not on file   Highest education level: Not on file  Occupational History   Not on file  Tobacco Use   Smoking status: Never   Smokeless tobacco: Never  Vaping Use   Vaping Use: Never used  Substance and Sexual Activity   Alcohol use: Yes  Alcohol/week: 140.0 standard drinks of alcohol    Types: 140 Cans of beer per week    Comment: reported 20+ beers/day   Drug use: No   Sexual activity: Yes  Other Topics Concern   Not on file  Social History Narrative   Not on file   Social Determinants of Health   Financial Resource Strain: Not on file  Food Insecurity: Not on file  Transportation Needs: Not on file  Physical Activity: Not on file  Stress: Not on file  Social Connections:  Not on file    Tobacco Cessation:  N/A, patient does not currently use tobacco products  Current Medications: Current Facility-Administered Medications  Medication Dose Route Frequency Provider Last Rate Last Admin   0.9 %  sodium chloride infusion   Intravenous Continuous Tu, Ching T, DO 100 mL/hr at 11/21/21 0801 New Bag at [id number]   folic acid (FOLVITE) tablet 1 mg  1 mg Oral Daily Tu, Ching T, DO       LORazepam (ATIVAN) injection 0-4 mg  0-4 mg Intravenous Q4H Tu, Ching T, DO       Or   LORazepam (ATIVAN) tablet 0-4 mg  0-4 mg Oral Q4H Tu, Ching T, DO   1 mg at 11/21/21 [id number]   multivitamin with minerals tablet 1 tablet  1 tablet Oral Daily Tu, Ching T, DO       thiamine (VITAMIN B1) tablet 100 mg  100 mg Oral Daily Tu, Ching T, DO       Or   thiamine (VITAMIN B1) injection 100 mg  100 mg Intravenous Daily Tu, Ching T, DO       Current Outpatient Medications  Medication Sig Dispense Refill   gabapentin (NEURONTIN) 300 MG capsule Take 1 capsule (300 mg total) by mouth 4 (four) times daily - after meals and at bedtime. For anxiety/alcohol withdrawal (Patient not taking: Reported on 11/20/2021) 120 capsule 0   hydrOXYzine (ATARAX) 50 MG tablet Take 1 tablet (50 mg total) by mouth 3 (three) times daily as needed for anxiety (or sleep). (Patient not taking: Reported on 11/20/2021) 75 tablet 0   mirtazapine (REMERON) 15 MG tablet Take 1 tablet (15 mg total) by mouth at bedtime. For depression/sleep (Patient not taking: Reported on 11/20/2021) 30 tablet 0   Multiple Vitamin (MULTIVITAMIN WITH MINERALS) TABS tablet Take 1 tablet by mouth daily. (Patient not taking: Reported on 08/11/2021) 30 tablet 0   traZODone (DESYREL) 50 MG tablet Take 1 tablet (50 mg total) by mouth at bedtime. For sleep (Patient not taking: Reported on 11/20/2021) 30 tablet 0   PTA Medications: (Not in a hospital admission)   [place] Scale:  [place] [facility] from 11/20/2021 in [place] ED from 10/05/2021 in [place] DEPT ED from 10/04/2021 in [place] DEPT  C-SSRS RISK CATEGORY Moderate Risk No Risk No Risk      Psychiatric Specialty Exam: Presentation  General Appearance:  Appropriate for Environment  Eye Contact: Good  Speech: Clear and Coherent; Normal Rate  Speech Volume: Normal  Handedness: Right   Mood and Affect  Mood: Euthymic  Affect: Congruent; Appropriate   Thought Process  Thought Processes: Coherent; Goal Directed; Linear  Descriptions of Associations:Intact  Orientation:Full (Time, Place and Person)  Thought Content:Logical  History of Schizophrenia/Schizoaffective disorder:No  Duration of Psychotic Symptoms:No data recorded Hallucinations:No data recorded Ideas of Reference:None  Suicidal Thoughts:No data recorded Homicidal Thoughts:No data recorded  Sensorium  Memory: Immediate Good; Recent Good; Remote Good  Judgment: Good  Insight: Good; Present   Executive Functions  Concentration: Good  Attention Span: Good  Recall: Good  Fund of Knowledge: Good  Language: Good   Assets  Assets: Desire for Improvement; Communication Skills; Housing; Resilience; Social Support; Physical Health   Sleep  Sleep: Fair    Physical Exam: Physical Exam Constitutional:      General: He is not in acute distress.    Appearance: He is not toxic-appearing.  HENT:     Head: Normocephalic.     Right Ear: External ear normal.     Left Ear: External ear normal.     Nose: Nose normal.  Eyes:     General: No scleral icterus.    Extraocular Movements: Extraocular movements intact.     Conjunctiva/sclera: Conjunctivae normal.     Pupils: Pupils are equal, round, and reactive to light.  Cardiovascular:     Rate and Rhythm: Normal rate and regular rhythm.  Pulmonary:     Effort: Pulmonary effort is normal.     Breath sounds: Normal  breath sounds.  Musculoskeletal:     Cervical back: Normal range of motion.  Skin:    General: Skin is warm.  Neurological:     General: No focal deficit present.     Mental Status: He is alert.     Comments: Generalized tremulous present   Psychiatric:        Mood and Affect: Mood normal.   ROS Blood pressure 128/82, pulse 98, temperature 98.4 F (36.9 C), temperature source Oral, resp. rate 18, SpO2 96 %. There is no height or weight on file to calculate BMI.   Demographic Factors:  Male, Caucasian, and Unemployed  Loss Factors: Legal issues  Historical Factors: NA  Risk Reduction Factors:   Sense of responsibility to family, Positive social support, and patient relies on mother for emotional support   Continued Clinical Symptoms:  Alcohol/Substance Abuse/Dependencies  Cognitive Features That Contribute To Risk:  None    Suicide Risk:  Minimal: No identifiable suicidal ideation.  Patients presenting with no risk factors but with morbid ruminations; may be classified as minimal risk based on the severity of the depressive symptoms   Follow-up [facility]. Go to.   Specialty: Urgent Care Why: Available 24 hours for alcohol detox and treatment. Contact information: Imbery Sinton [phone]                Plan Of Care/Follow-up recommendations:  Other:  Resource provided to follow-up at [facility] for Detox and Alcohol addiction treatment. If any mental health crisis or symptoms of depression or anxiety, follow-up at [facility].   Medical Decision Making: Patient case review and discussed with Dr. Dwyane Dee. Patient does not meet inpatient criteria for psychiatric treatment. Patient is able to contract for safety.  Patient declined transfer to [facility] facility based crisis for detox  after medical clearance due to active legal charges and court date pending for 11/24/21. Patient is psych  cleared. EDP, RN, LCSW, notified of disposition.     Disposition: Admit  Molli Barrows, FNP-C, PMHNP-BC 11/21/2021, 9:57 AM

## 2021-11-21 NOTE — ED Notes (Signed)
Chaplin called for the patient at this time upon his request to speak with a councilor

## 2021-11-21 NOTE — ED Notes (Addendum)
Despite best efforts to educate the patient patient continues to pull off all monitoring equipment. Patient educated on the benefits of his plan of care patient does not verbalize understanding of such just demands food and drink. Patient provided with ice chips at this time. Dr Cyndia Bent notified via secure chat.

## 2021-11-21 NOTE — Assessment & Plan Note (Addendum)
-  reports up to 24 beers daily. ETOH level at 102 today and tremulous on exam -continue CIWA protocol q1hr x 4 hr and then q4hr  -folic and thiamine daily

## 2021-11-21 NOTE — Assessment & Plan Note (Signed)
-  pt with suicidal thoughts but no plans. Mostly feel like he is "dying" and has racing thoughts -suicide precaution -need psych consult

## 2021-11-21 NOTE — Assessment & Plan Note (Signed)
Repleted with IV K.  -follow with repeat in the morning

## 2021-11-21 NOTE — ED Notes (Signed)
Patient given apple juice. Patient also requesting to speak to Doctor about leaving.

## 2021-11-21 NOTE — Assessment & Plan Note (Signed)
Secondary to heavy alcohol use. Abdominal exam otherwise benign.

## 2021-11-21 NOTE — ED Notes (Signed)
Patient ambulated to bathroom with no problem.  

## 2021-11-21 NOTE — Discharge Instructions (Addendum)
Follow with Primary MD in 3 days   Get CBC, CMP,  checked  by Primary MD next visit.    Activity: As tolerated with Full fall precautions use walker/cane & assistance as needed   Disposition Home   Diet: Regular Diet   On your next visit with your primary care physician please Get Medicines reviewed and adjusted.   Please request your Prim.MD to go over all Hospital Tests and Procedure/Radiological results at the follow up, please get all Hospital records sent to your Prim MD by signing hospital release before you go home.   If you experience worsening of your admission symptoms, develop shortness of breath, life threatening emergency, suicidal or homicidal thoughts you must seek medical attention immediately by calling 911 or calling your MD immediately  if symptoms less severe.  You Must read complete instructions/literature along with all the possible adverse reactions/side effects for all the Medicines you take and that have been prescribed to you. Take any new Medicines after you have completely understood and accpet all the possible adverse reactions/side effects.   Do not drive, operating heavy machinery, perform activities at heights, swimming or participation in water activities or provide baby sitting services if your were admitted for syncope or siezures until you have seen by Primary MD or a Neurologist and advised to do so again.  Do not drive when taking Pain medications.    Do not take more than prescribed Pain, Sleep and Anxiety Medications  Special Instructions: If you have smoked or chewed Tobacco  in the last 2 yrs please stop smoking, stop any regular Alcohol  and or any Recreational drug use.  Wear Seat belts while driving.   Please note  You were cared for by a hospitalist during your hospital stay. If you have any questions about your discharge medications or the care you received while you were in the hospital after you are discharged, you can call the  unit and asked to speak with the hospitalist on call if the hospitalist that took care of you is not available. Once you are discharged, your primary care physician will handle any further medical issues. Please note that NO REFILLS for any discharge medications will be authorized once you are discharged, as it is imperative that you return to your primary care physician (or establish a relationship with a primary care physician if you do not have one) for your aftercare needs so that they can reassess your need for medications and monitor your lab values.        Outpatient Substance Use Treatment Services       [facility]    Chemical Dependence Intensive Outpatient Program   [address]   [place], [place] [address]    [phone]   Private insurance, Medicare A&B, and [facility]       [facility] ([facility] Drug Services)    [address],    [place], [place] [address]   [phone]   Medicaid, Self Pay       [facility]        [address]    [place], [place]   [phone]   Medicaid and [employer], Self Pay       [facility]   [address]    [place], [place]    [phone]   Private Insurance, and Self Pay      [facility]        [phone]  [address]      [place], [place] [address]    [phone] or [phone]   Private Insurance Only                                                 [facility] Total Access Care   [address]    [place], [place] [address]   [phone]   Medicaid, Medicare, Private Insurance      [facility] Counseling Services at the [facility]   [address]    [place], [place] [address]   [phone]   Services are free or reduced      [facility]    [address]   [phone]    Self Pay only, sliding scale      [facility]     [address]    [place], [place] [address]   [phone]   ([employer])   Self Pay, Medicaid Only       [facility]   [address], [place] [address]   [phone]   Medicaid, Medicare, Private Insurance                                                               Adolescent Substance Use Treatment Services          [facility]   [address]    [place], [place]    [phone]   Self Pay   Offer scholarships from the [facility] to help pay for treatment    Website: http://www.lambert.com/      [facility]   Adolescent Substance use Program   Males ages: 46-17   Adolescent Substance use Program   Females: 12-17      [facility]   [address]  [place], [place] [address]   (ph) [phone]  (fax) [phone]      [facility]    [address]  [place], [place] [address]   (ph) [phone]  (fax) [phone]      [facility]   [address]  [place], [place] [address]   (ph) [phone]  (fax) [phone]      [facility]   [address], [place], [place] [address]   (ph) [phone]   (fax) [phone]      Website: [email or web address].[email or web address]                           [facility]   Substance Abuse Intensive Outpatient Program for Adolescents   Phone: [phone]   Address: [address], [place], [place]   Website: [email or web address]://wilkins.info/         Residential Substance Use Armed forces logistics/support/administrative officer ([facility]    [address]    [place], [place] [address]    [phone] or [phone]   Detox (Medicare, Medicaid, private insurance, and self pay)    Residential Rehab 14  days (Medicare, Medicaid, private insurance, and self pay)       [facility] ([facility]idential Treatment Services)    [address] [place], [place]    [phone]    Male and Male Detox (Self Pay and Medicaid limited availability)    Rehab only Male (Medicaid and self pay only)       [facility] [address]        [address]    [place], [place] [address]    [phone] or [phone]   Detox and Residential Treatment   Private Insurance Only       [facility]idential [facility]    [address]    [place], [place] [address]    [phone]    Treatment Only, must make assessment appointment, and must be sober for assessment appointment.    Self Pay Only, Medicare A&B, [facility], [place] ID only!   *Transportation assistance offered from [place] on [employer]       [address]   [place], [place] [address]   Walk in interviews M-Sat 8-4p   No pending legal charges   [phone]               ADATC:  [facility]   Referral    [address]   [place], [place]   [phone]   (Self Pay, [facility])      [facility]   [address]   [place], [place] [address]   [phone]   Detox and Residential Treatment   Medicare and Private Insurance      [facility]   [address]    [place], [place] [address]   28 Day Women's Facility: [phone]   28 Day Men's Facility: [phone]   Long-term Residential Program:    [phone] Males 25 and Over   (No Insurance, upfront fee)      [facility]    [address]   [place], [place] [address]   [phone]   Private Insurance with [place], Private Pay     [facility]   [address]  [place], [place] [address]  Local?[phone]   Private Insurance Only      [facility]   [address]    [place], [place] [address]    [phone]   (Males, upfront fee)      [facility]   [address]    [place], [address]   [phone]   Private Insurance                [facility] Locations      [facility]    [address]    [place], [place]    [phone]   [facility] Based Program for individuals experiencing homelessness   Self Pay, No insurance      [facility]    Men's program: [facility]   [address] [place], [place] [address]   [phone]      [facility]   Women's program: [facility]   [address]  [place], [place] [address]   [phone]   [facility] Based Program for individuals experiencing homelessness   Self Pay, No insurance      [facility] Men's [facility]   [address] [place], [place] [address]    [phone]   [place] Based Program for individuals experiencing homelessness   Self  Pay, No insurance      [facility] Women[facility]   [address]    [place], [place] [address]   [phone]   [facility] Based Program for individuals experiencing homelessness   Self Pay, No insurance      [facility]   [address] [place], [place]   [phone]   [place] Based Program for males experiencing homelessness   Self Pay, No insurance

## 2021-11-21 NOTE — ED Notes (Signed)
Md at bedside

## 2021-11-21 NOTE — Discharge Summary (Signed)
Physician Discharge Summary  Thomas [name] [id number] DOB: 1990-10-31 DOA: 11/20/2021  PCP: Pcp, No  Admit date: 11/20/2021 Discharge date: 11/21/2021  Admitted From: (Home) Disposition:  (Home )  Recommendations for Outpatient Follow-up:  Follow up with PCP , continue counseling about alcohol abuse cessation  Discharge Condition: (Stable) CODE STATUS: (FULL) Diet recommendation: Regular   Brief/Interim Summary:  Thomas [name] is a 31 y.o. male with medical history significant of Alcohol abuse, GAD, depression who presents with alcohol withdrawal.   Alcohol withdrawal ([place]) -Patient started on CIWA protocol initially, he wants to go home today, so he was started on Librium, no evidence of withdrawals currently on Librium, ambulated in the hallway, good appetite, will DC home on Librium taper taper, thiamine and folic acid.   Metabolic acidosis -Received aggressive IV fluid hydration   Depression with suicidal ideation -pt with suicidal thoughts but no plans. Mostly feel like he is "dying" and has racing thoughts -Was seen by TTS, no indication for inpatient psych admission   Abnormal LFTs Secondary to heavy alcohol use. Abdominal exam otherwise benign.   Hypokalemia Repleted    Discharge Diagnoses:  Principal Problem:   Alcohol withdrawal ([place]) Active Problems:   Hypokalemia   Abnormal LFTs   Depression with suicidal ideation   Metabolic acidosis    Discharge Instructions  Discharge Instructions     Diet - low sodium heart healthy   Complete by: As directed    Discharge instructions   Complete by: As directed    Follow with Primary MD in 3 days   Get CBC, CMP,  checked  by Primary MD next visit.    Activity: As tolerated with Full fall precautions use walker/cane & assistance as needed   Disposition Home   Diet: Regular Diet   On your next visit with your primary care physician please Get Medicines reviewed and  adjusted.   Please request your Prim.MD to go over all Hospital Tests and Procedure/Radiological results at the follow up, please get all Hospital records sent to your Prim MD by signing hospital release before you go home.   If you experience worsening of your admission symptoms, develop shortness of breath, life threatening emergency, suicidal or homicidal thoughts you must seek medical attention immediately by calling 911 or calling your MD immediately  if symptoms less severe.  You Must read complete instructions/literature along with all the possible adverse reactions/side effects for all the Medicines you take and that have been prescribed to you. Take any new Medicines after you have completely understood and accpet all the possible adverse reactions/side effects.   Do not drive, operating heavy machinery, perform activities at heights, swimming or participation in water activities or provide baby sitting services if your were admitted for syncope or siezures until you have seen by Primary MD or a Neurologist and advised to do so again.  Do not drive when taking Pain medications.    Do not take more than prescribed Pain, Sleep and Anxiety Medications  Special Instructions: If you have smoked or chewed Tobacco  in the last 2 yrs please stop smoking, stop any regular Alcohol  and or any Recreational drug use.  Wear Seat belts while driving.   Please note  You were cared for by a hospitalist during your hospital stay. If you have any questions about your discharge medications or the care you received while you were in the hospital after you are discharged, you can call the unit and asked to speak with  the hospitalist on call if the hospitalist that took care of you is not available. Once you are discharged, your primary care physician will handle any further medical issues. Please note that NO REFILLS for any discharge medications will be authorized once you are discharged, as it is  imperative that you return to your primary care physician (or establish a relationship with a primary care physician if you do not have one) for your aftercare needs so that they can reassess your need for medications and monitor your lab values.   Increase activity slowly   Complete by: As directed       Allergies as of 11/21/2021   No Known Allergies      Medication List     STOP taking these medications    gabapentin 300 MG capsule Commonly known as: NEURONTIN   mirtazapine 15 MG tablet Commonly known as: REMERON   traZODone 50 MG tablet Commonly known as: DESYREL       TAKE these medications    chlordiazePOXIDE 25 MG capsule Commonly known as: LIBRIUM Please take 25 mg oral 3 times daily x3 days, then 25 mg oral twice daily x3  days, then 25 mg oral daily x3 days then stop.   folic acid 1 MG tablet Commonly known as: FOLVITE Take 1 tablet (1 mg total) by mouth daily. Start taking on: November 22, 2021   hydrOXYzine 50 MG tablet Commonly known as: ATARAX Take 1 tablet (50 mg total) by mouth 3 (three) times daily as needed for anxiety (or sleep).   multivitamin with minerals Tabs tablet Take 1 tablet by mouth daily. Start taking on: November 22, 2021   thiamine 100 MG tablet Commonly known as: Vitamin B-1 Take 1 tablet (100 mg total) by mouth daily. Start taking on: November 22, 2021        [facility]. Go to.   Specialty: Urgent Care Why: Available 24 hours for alcohol detox and treatment. Contact information: [place] [phone]               No Known Allergies  Consultations: Psychiatry   Procedures/Studies: No results found.    Subjective: No abdominal pain, no nausea, no vomiting, no headache tolerated his lunch, ambulated in the hallway with steady gait.  Discharge Exam: Vitals:   11/21/21 1315 11/21/21 1330  BP: 110/71 109/71   Pulse: 93 84  Resp: (!) 8 17  Temp:    SpO2: 97% 96%   Vitals:   11/21/21 1025 11/21/21 1300 11/21/21 1315 11/21/21 1330  BP: 131/76 119/70 110/71 109/71  Pulse: 97 93 93 84  Resp:  15 (!) 8 17  Temp:      TempSrc:      SpO2:  97% 97% 96%    General: Pt is alert, awake, not in acute distress, no tremors Cardiovascular: RRR, S1/S2 +, no rubs, no gallops Respiratory: CTA bilaterally, no wheezing, no rhonchi Abdominal: Soft, NT, ND, bowel sounds + Extremities: no edema, no cyanosis    The results of significant diagnostics from this hospitalization (including imaging, microbiology, ancillary and laboratory) are listed below for reference.     Microbiology: No results found for this or any previous visit (from the past 240 hour(s)).   Labs: BNP (last 3 results) No results for input(s): "BNP" in the last 8760 hours. Basic Metabolic Panel: Recent Labs  Lab 11/20/21 1512  NA 131*  K 3.3*  CL 88*  CO2 25  GLUCOSE 273*  BUN 8  CREATININE 0.95  CALCIUM 8.7*   Liver Function Tests: Recent Labs  Lab 11/20/21 1512  AST 229*  ALT 232*  ALKPHOS 88  BILITOT 0.8  PROT 7.5  ALBUMIN 4.3   No results for input(s): "LIPASE", "AMYLASE" in the last 168 hours. No results for input(s): "AMMONIA" in the last 168 hours. CBC: Recent Labs  Lab 11/20/21 1512  WBC 8.2  HGB 15.5  HCT 41.6  MCV 90.6  PLT 197   Cardiac Enzymes: Recent Labs  Lab 11/20/21 2003  CKTOTAL 317   BNP: Invalid input(s): "POCBNP" CBG: No results for input(s): "GLUCAP" in the last 168 hours. D-Dimer No results for input(s): "DDIMER" in the last 72 hours. Hgb A1c No results for input(s): "HGBA1C" in the last 72 hours. Lipid Profile No results for input(s): "CHOL", "HDL", "LDLCALC", "TRIG", "CHOLHDL", "LDLDIRECT" in the last 72 hours. Thyroid function studies No results for input(s): "TSH", "T4TOTAL", "T3FREE", "THYROIDAB" in the last 72 hours.  Invalid input(s): "FREET3" Anemia work  up No results for input(s): "VITAMINB12", "FOLATE", "FERRITIN", "TIBC", "IRON", "RETICCTPCT" in the last 72 hours. Urinalysis    Component Value Date/Time   COLORURINE YELLOW 06/19/2021 2040   APPEARANCEUR CLEAR 06/19/2021 2040   LABSPEC 1.006 06/19/2021 2040   PHURINE 5.0 06/19/2021 2040   GLUCOSEU NEGATIVE 06/19/2021 2040   HGBUR MODERATE (A) 06/19/2021 2040   BILIRUBINUR NEGATIVE 06/19/2021 2040   KETONESUR NEGATIVE 06/19/2021 2040   PROTEINUR NEGATIVE 06/19/2021 2040   NITRITE NEGATIVE 06/19/2021 2040   LEUKOCYTESUR NEGATIVE 06/19/2021 2040   Sepsis Labs Recent Labs  Lab 11/20/21 1512  WBC 8.2   Microbiology No results found for this or any previous visit (from the past 240 hour(s)).   Time coordinating discharge: Over 30 minutes  SIGNED:   Huey Bienenstock, MD  [facility] 11/21/2021, 2:02 PM Pager   If 7PM-7AM, please contact night-coverage [email or web address]

## 2021-11-21 NOTE — H&P (Signed)
History and Physical    Patient: Thomas [name] DOB: 10-08-1990 DOA: 11/20/2021 DOS: the patient was seen and examined on 11/21/2021 PCP: Pcp, No  Patient coming from: Home  Chief Complaint:  Chief Complaint  Patient presents with   Anxiety   Alcohol Intoxication   HPI: Thomas [name] is a 31 y.o. male with medical history significant of Alcohol abuse, GAD, depression who presents with alcohol withdrawal.   Pt drinks about 24 beers daily with last drink sometime this afternoon. Feels like he just cannot go on and is suicidal. However denies any plans and just feels like he is "dying" and heart feels "funny." Has had trouble urinating. Currently living out of his car but has no weapons. States there is a warrant for his arrest and he is suppose to go to court next week. Pt has tried rehab once in the past. Had been prescribed medication of mood disorders in the past but quit.    In the ED, temperature of 98.1 F, heart rate of 131, BP of 137/81 on room air.  CBC unremarkable.  Sodium of 131, potassium of 3.3, chloride of 88, CO2 25, CBG of 273, anion gap of 18.   AST of 229, ALT of 223, normal total bilirubin and alkaline phosphatase.  EtOH of [id number], salicylate acid of less than 7, UDS is positive for benzo but has received Ativan in the ED Review of Systems: As mentioned in the history of present illness. All other systems reviewed and are negative. Past Medical History:  Diagnosis Date   Anxiety    Past Surgical History:  Procedure Laterality Date   APPENDECTOMY     Social History:  reports that he has never smoked. He has never used smokeless tobacco. He reports current alcohol use of about 140.0 standard drinks of alcohol per week. He reports that he does not use drugs.  No Known Allergies  History reviewed. No pertinent family history.  Prior to Admission medications   Medication Sig Start Date End Date Taking? Authorizing Provider  gabapentin  (NEURONTIN) 300 MG capsule Take 1 capsule (300 mg total) by mouth 4 (four) times daily - after meals and at bedtime. For anxiety/alcohol withdrawal Patient not taking: Reported on 11/20/2021 08/16/21   Lindell Spar I, NP  hydrOXYzine (ATARAX) 50 MG tablet Take 1 tablet (50 mg total) by mouth 3 (three) times daily as needed for anxiety (or sleep). Patient not taking: Reported on 11/20/2021 08/16/21   Lindell Spar I, NP  mirtazapine (REMERON) 15 MG tablet Take 1 tablet (15 mg total) by mouth at bedtime. For depression/sleep Patient not taking: Reported on 11/20/2021 08/16/21   Lindell Spar I, NP  Multiple Vitamin (MULTIVITAMIN WITH MINERALS) TABS tablet Take 1 tablet by mouth daily. Patient not taking: Reported on 08/11/2021 06/24/21   Ival Bible, MD  traZODone (DESYREL) 50 MG tablet Take 1 tablet (50 mg total) by mouth at bedtime. For sleep Patient not taking: Reported on 11/20/2021 08/16/21   Lindell Spar I, NP    Physical Exam: Vitals:   11/20/21 2230 11/20/21 2245 11/20/21 2300 11/21/21 0015  BP: 110/65 121/75 116/65 120/67  Pulse: 94 (!) 104 (!) 101 (!) 135  Resp:   14   Temp:      TempSrc:      SpO2: 98% 98% 98%    Constitutional: NAD, calm, comfortable, nontoxic appearing young male laying in bed with resting hand tremors Eyes:  lids and conjunctivae normal ENMT: Mucous membranes are  moist.  Neck: normal, supple Respiratory: clear to auscultation bilaterally, no wheezing, no crackles. Normal respiratory effort. No accessory muscle use.  Cardiovascular: Tachycardia, no murmurs / rubs / gallops. No extremity edema. 2+ pedal pulses.  Abdomen: Soft, nontender nondistended.  Bowel sounds positive.  Musculoskeletal: no clubbing / cyanosis. No joint deformity upper and lower extremities. Good ROM, no contractures. Normal muscle tone.  Skin: no rashes, lesions, ulcers. No induration Neurologic: CN 2-12 grossly intact. Strength 5/5 in all 4.  Psychiatric: Normal judgment and insight.  Alert and oriented x 3. Normal mood. Data Reviewed:  See HPI  Assessment and Plan: * Alcohol withdrawal ([facility]) -reports up to 24 beers daily. ETOH level at 102 today and tremulous on exam -continue CIWA protocol q1hr x 4 hr and then q4hr  -folic and thiamine daily   Metabolic acidosis Secondary to alcohol abuse -continuous IV fluid hydration  Depression with suicidal ideation -pt with suicidal thoughts but no plans. Mostly feel like he is "dying" and has racing thoughts -suicide precaution -need psych consult  Abnormal LFTs Secondary to heavy alcohol use. Abdominal exam otherwise benign.  Hypokalemia Repleted with IV K.  -follow with repeat in the morning      Advance Care Planning:   Code Status: Full Code   Consults: none  Family Communication: none at bedside  Severity of Illness: The appropriate patient status for this patient is OBSERVATION. Observation status is judged to be reasonable and necessary in order to provide the required intensity of service to ensure the patient's safety. The patient's presenting symptoms, physical exam findings, and initial radiographic and laboratory data in the context of their medical condition is felt to place them at decreased risk for further clinical deterioration. Furthermore, it is anticipated that the patient will be medically stable for discharge from the hospital within 2 midnights of admission.   Author: Anselm Jungling, DO 11/21/2021 12:23 AM  For on call review [email or web address].

## 2021-11-21 NOTE — Progress Notes (Signed)
CSW added resources for substance use to patient's AVS.  Edwin Dada, MSW, LCSW Transitions of Care  Clinical Social Worker II [phone]

## 2021-11-21 NOTE — ED Notes (Signed)
Ambulates to the restroom at this time without difficulty safety sitter at bedside

## 2021-11-21 NOTE — Assessment & Plan Note (Signed)
Secondary to alcohol abuse -continuous IV fluid hydration

## 2021-11-22 ENCOUNTER — Emergency Department (HOSPITAL_COMMUNITY)
Admission: EM | Admit: 2021-11-22 | Discharge: 2021-11-22 | Disposition: A | Discharge status: Home / Self Care | Payer: Self-pay | Attending: Emergency Medicine | Admitting: Emergency Medicine

## 2021-11-22 ENCOUNTER — Emergency Department (HOSPITAL_COMMUNITY): Payer: Self-pay

## 2021-11-22 DIAGNOSIS — R7309 Other abnormal glucose: Secondary | ICD-10-CM | POA: Insufficient documentation

## 2021-11-22 DIAGNOSIS — F10929 Alcohol use, unspecified with intoxication, unspecified: Secondary | ICD-10-CM

## 2021-11-22 DIAGNOSIS — W01198A Fall on same level from slipping, tripping and stumbling with subsequent striking against other object, initial encounter: Secondary | ICD-10-CM | POA: Insufficient documentation

## 2021-11-22 DIAGNOSIS — F10129 Alcohol abuse with intoxication, unspecified: Secondary | ICD-10-CM | POA: Insufficient documentation

## 2021-11-22 DIAGNOSIS — S0083XA Contusion of other part of head, initial encounter: Secondary | ICD-10-CM | POA: Insufficient documentation

## 2021-11-22 DIAGNOSIS — Y909 Presence of alcohol in blood, level not specified: Secondary | ICD-10-CM | POA: Insufficient documentation

## 2021-11-22 LAB — CBG MONITORING, ED: Glucose-Capillary: 109 mg/dL — ABNORMAL HIGH (ref 70–99)

## 2021-11-22 MED ORDER — LORAZEPAM 1 MG PO TABS: 0.0000 mg | ORAL_TABLET | Freq: Two times a day (BID) | ORAL | Status: DC

## 2021-11-22 MED ORDER — LORAZEPAM 2 MG/ML IJ SOLN
INTRAMUSCULAR | Status: AC
  Filled 2021-11-22: qty 1

## 2021-11-22 MED ORDER — LORAZEPAM 1 MG PO TABS: 0.0000 mg | ORAL_TABLET | Freq: Four times a day (QID) | ORAL | Status: DC

## 2021-11-22 MED ORDER — LORAZEPAM 2 MG/ML IJ SOLN
0.0000 mg | Freq: Four times a day (QID) | INTRAMUSCULAR | Status: DC
  Administered 2021-11-22: 4 mg via INTRAVENOUS
  Filled 2021-11-22: qty 2

## 2021-11-22 MED ORDER — LORAZEPAM 2 MG/ML IJ SOLN: 0.0000 mg | Freq: Two times a day (BID) | INTRAMUSCULAR | Status: DC

## 2021-11-22 MED ORDER — THIAMINE HCL 100 MG/ML IJ SOLN: 100.0000 mg | Freq: Every day | INTRAMUSCULAR | Status: DC

## 2021-11-22 MED ORDER — THIAMINE MONONITRATE 100 MG PO TABS: 100.0000 mg | ORAL_TABLET | Freq: Every day | ORAL | Status: DC

## 2021-11-22 NOTE — ED Notes (Signed)
Pt's head wound wrapped

## 2021-11-22 NOTE — ED Notes (Signed)
Patient removed c-collar after multiple requests for him to leave it in place.

## 2021-11-22 NOTE — ED Notes (Signed)
Pt ripped off bandage on his head

## 2021-11-22 NOTE — ED Notes (Signed)
Patient pulling objects mounted on walls to slide his recliner around the triage pod. Patient returned to triage room.

## 2021-11-22 NOTE — ED Triage Notes (Signed)
Patient BIB [facility] from a residence after he fell and hit the left of his face on the floor. Patient is heavily intoxicated, shouting at EMS and staff. Patient arrives to ED naked except for a c-collar, EMS states his clothes were removed prior to transport due to being saturated in urine. Patient is uncooperative, removing bandages and throwing them at passersby and on the floor.  Patient attempting multiple times to remove c-collar.  18g saline lock in left forearm. BP 126/78 HR 108 RR 20 SpO2 96% on room air CBG 99

## 2021-11-22 NOTE — ED Notes (Signed)
Patient transported to CT 

## 2021-11-22 NOTE — ED Notes (Signed)
Pt stated he needs help with his psych meds and is having panic attacks, he also stated he is suicidal. Pt is within a nurses view, will continue to monitor.

## 2021-11-22 NOTE — ED Notes (Signed)
Pt changed and placed in gown

## 2021-11-22 NOTE — ED Notes (Signed)
Pt was transported out of the hospital with police. He has a warrant.

## 2021-11-22 NOTE — ED Provider Notes (Signed)
[facility] EMERGENCY DEPARTMENT Provider Note   CSN: [id number] Arrival date & time: 11/22/21  1241     History PMH: ETOH use disorder, MDD, GAD Chief Complaint  Patient presents with   Alcohol Intoxication    Thomas [name] is a 31 y.o. male.  Presenting to the ED with acute alcohol intoxication.  Currently EMS was called after he had fallen on his face on the floor.  He was apparently heavily intoxicated and belligerent with EMS.   Unable to obtain history due to patient's intoxication   Alcohol Intoxication       Home Medications Prior to Admission medications   Medication Sig Start Date End Date Taking? Authorizing Provider  chlordiazePOXIDE (LIBRIUM) 25 MG capsule Please take 25 mg oral 3 times daily x3 days, then 25 mg oral twice daily x3  days, then 25 mg oral daily x3 days then stop. 11/21/21   Elgergawy, Leana Roe, MD  folic acid (FOLVITE) 1 MG tablet Take 1 tablet (1 mg total) by mouth daily. 11/22/21   Elgergawy, Leana Roe, MD  hydrOXYzine (ATARAX) 50 MG tablet Take 1 tablet (50 mg total) by mouth 3 (three) times daily as needed for anxiety (or sleep). Patient not taking: Reported on 11/20/2021 08/16/21   Armandina Stammer I, NP  Multiple Vitamin (MULTIVITAMIN WITH MINERALS) TABS tablet Take 1 tablet by mouth daily. 11/22/21   Elgergawy, Leana Roe, MD  thiamine (VITAMIN B1) 100 MG tablet Take 1 tablet (100 mg total) by mouth daily. 11/22/21   Elgergawy, Leana Roe, MD      Allergies    Patient has no known allergies.    Review of Systems   Review of Systems  Psychiatric/Behavioral:  Positive for agitation.   All other systems reviewed and are negative.   Physical Exam Updated Vital Signs BP 118/77 (BP Location: Left Arm)   Pulse 100   Temp 97.9 F (36.6 C) (Oral)   Resp 18   SpO2 99%  Physical Exam Vitals and nursing note reviewed.  Constitutional:      General: He is not in acute distress.    Appearance: Normal appearance. He is  well-developed. He is not ill-appearing, toxic-appearing or diaphoretic.  HENT:     Head: Normocephalic.     Comments: Hematoma to forehead. Bruising and abrasion to both cheeks.     Nose: No nasal deformity.     Mouth/Throat:     Lips: Pink. No lesions.  Eyes:     General: Gaze aligned appropriately. No scleral icterus.       Right eye: No discharge.        Left eye: No discharge.     Conjunctiva/sclera: Conjunctivae normal.     Right eye: Right conjunctiva is not injected. No exudate or hemorrhage.    Left eye: Left conjunctiva is not injected. No exudate or hemorrhage. Pulmonary:     Effort: Pulmonary effort is normal. No respiratory distress.  Skin:    General: Skin is warm and dry.  Neurological:     Mental Status: He is alert.     Comments: Sleeping after Ativan  Psychiatric:        Speech: Speech normal.        Behavior: Behavior is cooperative.     ED Results / Procedures / Treatments   Labs (all labs ordered are listed, but only abnormal results are displayed) Labs Reviewed  COMPREHENSIVE METABOLIC PANEL  ETHANOL  RAPID URINE DRUG SCREEN, HOSP PERFORMED  CBC WITH  DIFFERENTIAL/PLATELET  SALICYLATE LEVEL  ACETAMINOPHEN LEVEL  CBG MONITORING, ED    EKG None  Radiology No results found.  Procedures Procedures   Medications Ordered in ED Medications  LORazepam (ATIVAN) injection 0-4 mg (4 mg Intravenous Given 11/22/21 1354)    Or  LORazepam (ATIVAN) tablet 0-4 mg ( Oral See Alternative 11/22/21 1354)  LORazepam (ATIVAN) injection 0-4 mg (has no administration in time range)    Or  LORazepam (ATIVAN) tablet 0-4 mg (has no administration in time range)  thiamine (VITAMIN B1) tablet 100 mg (100 mg Oral Not Given 11/22/21 1417)    Or  thiamine (VITAMIN B1) injection 100 mg ( Intravenous See Alternative 11/22/21 1417)    ED Course/ Medical Decision Making/ A&P Clinical Course as of 11/22/21 1529  Sat Nov 22, 2021  1524 Pt evaluated [SB]    Clinical  Course User Index [SB] Blue, Soijett A, PA-C                           Medical Decision Making Amount and/or Complexity of Data Reviewed Labs: ordered. Radiology: ordered.  Risk OTC drugs. Prescription drug management.   Here with acute alcohol intoxication and obvious facial and head trauma.  He was originally very belligerent required a dose of Ativan.  There was some concern for possible suicidal ideations as well, however patient has been cleared by psych just yesterday.  By the time I evaluated patient, he was very sleepy due to Ativan.  Will order CT head facial, and cervical spine imaging to rule out traumatic injury.  Patient will need to metabolize alcohol further in order to be discharged today.  3:29 PM Care of Thomas [name] transferred to [facility] and Dr. Langston Masker at the end of my shift as the patient will require reassessment once labs/imaging have resulted. Patient presentation, ED course, and plan of care discussed with review of all pertinent labs and imaging. Please see his/her note for further details regarding further ED course and disposition. Plan at time of handoff is f/u on imaging.  Reassess once more awake. Dispo accordingly. This may be altered or completely changed at the discretion of the oncoming team pending results of further workup.  Final Clinical Impression(s) / ED Diagnoses Final diagnoses:  Alcoholic intoxication with complication [facility])    Rx / DC Orders ED Discharge Orders     None         Adolphus Birchwood, PA-C 11/22/21 1529    Fredia Sorrow, MD 11/25/21 1544

## 2021-11-22 NOTE — ED Notes (Signed)
Pt ambulated to the bathroom with assistance, pt continuously educated not to get up without help due to being a fall risk.

## 2021-11-22 NOTE — ED Provider Notes (Signed)
Care transferred from Paulita Cradle, PA-C at time of sign out. See their note for full assessment.   Briefly: Patient is 31 y.o. male who presents to the ED with fall and ETOH intoxication onset PTA.    Plan: Plan per previous PA-C: pending scans and dispo.  Labs Reviewed  COMPREHENSIVE METABOLIC PANEL  ETHANOL  RAPID URINE DRUG SCREEN, HOSP PERFORMED  CBC WITH DIFFERENTIAL/PLATELET  SALICYLATE LEVEL  ACETAMINOPHEN LEVEL  CBG MONITORING, ED    Clinical Course as of 11/22/21 2020  Sat Nov 22, 2021  1524 Pt evaluated [SB]  1551 Informed by RN that patient became agitated in the CT scanner. Attending in to speak with patient. [SB]  1555 This is a 31 year old male with a history of chronic alcohol use presenting to the ED with acute alcohol intoxication.  The patient has been quite belligerent since his arrival in the emergency department.  He also demonstrated evidence of alcohol withdrawal, and just been hospitalized for alcohol withdrawal yesterday and discharged.  He was given Ativan for withdrawal symptoms and for some sedation.  Subsequently is calm down and his vital signs improved.  He had a large contusion to the left side of his face reports that he fell and injured himself earlier.  CT scan of the head was ordered personally read interpreted, with no evident brain bleed.  I have a low suspicion for clinically significant facial fracture.  Low suspicion for [facility] fracture or jaw fracture.  The wound was cleaned and dressed in the ED.  He will be monitored for improved clinical sobriety and then discharge. [MT]  56 Of note, the patient was just evaluated by our behavioral health team for passive SI yesterday and felt to be reasonably safe for outpatient discharge and follow-up.  He was also seen by social work and provided all available resources for shelters.  He was also admitted to the hospital for alcohol withdrawal and provided with a benzo taper.  This was all within the past 24  to 48 hours.  I do not see an indication to repeat this process at this time, and will encourage him to follow-up with the resources that were already provided [MT]  1627 Patient is now readily ambulating, and able to speak clearly to me. he is directable on exam, I have not identified any other emergency process, and advised that he avoid drinking, and he will otherwise be discharged [MT]  1630 Discussed with patient discharge treatment plan.  Offered to provide patient with juice, patient notes that he would like apple juice at this time.  Patient provided with apple juice.  Discussed with patient discharge treatment plan.  Patient appears safe for discharge. [SB]  1655 Notified by RN that patient decided to lay down on the floor again.  Discussed with patient that he will need to get off of the floor and sit back in the chair while paperwork is being prepared for him.  Patient refusing to get off of the floor.  Patient assisted off the floor via the help of staff, security, [employer].  Discussed with patient that he will need to stay in the chair.  Offered patient allergies prior to discharge, patient agreeable at this time. Pt observed in the emergency department for more than 4 hours without indications of withdrawal seizures, DTs, or worsening symptoms.  Patient able to speak in clear complete sentences without difficulty. Pt appears safe for discharge at this time.  [SB]    Clinical Course User Index [MT]  Terald Sleeper, MD [SB] Ramiah Helfrich A, PA-C     Discussed with patient discharge treatment plan.  Patient agreeable this time.  Patient provided with allergies prior to discharge.  Pt discharged in [employer] custody due to had an outstanding warrants.  Patient to follow-up as indicated in discharge paperwork.   This chart was dictated using voice recognition software, Dragon. Despite the best efforts of this provider to proofread and correct errors, errors may still occur which can change documentation  meaning.   Ronzell Laban A, PA-C 11/22/21 2045    Terald Sleeper, MD 11/22/21 2216

## 2021-11-22 NOTE — ED Notes (Signed)
Pt was told multiple times to not get up, pt continued to try to get up, pt started stating, "I am going to go crazy". MD notified

## 2021-11-22 NOTE — ED Notes (Signed)
Patient repeatedly attempting to climb out of recliner. Patient does not acknowledge or follow verbal directions.

## 2021-11-22 NOTE — Discharge Instructions (Addendum)
Take your medications as prescribed. Attached are resources provided that you may use. Return to the ED if you are experiencing increasing/worsening symptoms.

## 2021-11-22 NOTE — ED Notes (Signed)
Pt will not stay in the chair, he consistently wants to lie on the floor. Patient is sitting on the floor and will not get up. Will continue to monitor.

## 2022-04-04 ENCOUNTER — Emergency Department (HOSPITAL_COMMUNITY)
Admission: EM | Admit: 2022-04-04 | Discharge: 2022-04-05 | Disposition: A | Discharge status: Home / Self Care | Payer: Medicaid Other | Attending: Emergency Medicine | Admitting: Emergency Medicine

## 2022-04-04 ENCOUNTER — Emergency Department (HOSPITAL_COMMUNITY): Payer: Medicaid Other

## 2022-04-04 ENCOUNTER — Other Ambulatory Visit: Payer: Self-pay

## 2022-04-04 ENCOUNTER — Encounter (HOSPITAL_COMMUNITY): Payer: Self-pay | Admitting: *Deleted

## 2022-04-04 DIAGNOSIS — X58XXXA Exposure to other specified factors, initial encounter: Secondary | ICD-10-CM | POA: Insufficient documentation

## 2022-04-04 DIAGNOSIS — Z5900 Homelessness unspecified: Secondary | ICD-10-CM

## 2022-04-04 DIAGNOSIS — Z5902 Unsheltered homelessness: Secondary | ICD-10-CM | POA: Insufficient documentation

## 2022-04-04 DIAGNOSIS — Z5329 Procedure and treatment not carried out because of patient's decision for other reasons: Secondary | ICD-10-CM | POA: Insufficient documentation

## 2022-04-04 DIAGNOSIS — F10920 Alcohol use, unspecified with intoxication, uncomplicated: Secondary | ICD-10-CM | POA: Diagnosis not present

## 2022-04-04 DIAGNOSIS — S0081XA Abrasion of other part of head, initial encounter: Secondary | ICD-10-CM | POA: Diagnosis not present

## 2022-04-04 DIAGNOSIS — Y908 Blood alcohol level of 240 mg/100 ml or more: Secondary | ICD-10-CM | POA: Insufficient documentation

## 2022-04-04 DIAGNOSIS — S00411A Abrasion of right ear, initial encounter: Secondary | ICD-10-CM | POA: Insufficient documentation

## 2022-04-04 LAB — COMPREHENSIVE METABOLIC PANEL
ALT: 40 U/L (ref 0–44)
AST: 35 U/L (ref 15–41)
Albumin: 4.6 g/dL (ref 3.5–5.0)
Alkaline Phosphatase: 113 U/L (ref 38–126)
Anion gap: 23 — ABNORMAL HIGH (ref 5–15)
BUN: 5 mg/dL — ABNORMAL LOW (ref 6–20)
CO2: 20 mmol/L — ABNORMAL LOW (ref 22–32)
Calcium: 9.1 mg/dL (ref 8.9–10.3)
Chloride: 97 mmol/L — ABNORMAL LOW (ref 98–111)
Creatinine, Ser: 0.73 mg/dL (ref 0.61–1.24)
GFR, Estimated: >60 mL/min (ref >60)
Glucose, Bld: 109 mg/dL — ABNORMAL HIGH (ref 70–99)
Potassium: 4 mmol/L (ref 3.5–5.1)
Sodium: 140 mmol/L (ref 135–145)
Total Bilirubin: 0.6 mg/dL (ref 0.3–1.2)
Total Protein: 8.5 g/dL — ABNORMAL HIGH (ref 6.5–8.1)

## 2022-04-04 LAB — CBC WITH DIFFERENTIAL/PLATELET
Abs Immature Granulocytes: 0.03 10*3/uL (ref 0.00–0.07)
Basophils Absolute: 0.1 10*3/uL (ref 0.0–0.1)
Basophils Relative: 1 %
Eosinophils Absolute: 0 10*3/uL (ref 0.0–0.5)
Eosinophils Relative: 0 %
HCT: 50.3 % (ref 39.0–52.0)
Hemoglobin: 17.2 g/dL — ABNORMAL HIGH (ref 13.0–17.0)
Immature Granulocytes: 0 %
Lymphocytes Relative: 21 %
Lymphs Abs: 2 10*3/uL (ref 0.7–4.0)
MCH: 31 pg (ref 26.0–34.0)
MCHC: 34.2 g/dL (ref 30.0–36.0)
MCV: 90.8 fL (ref 80.0–100.0)
Monocytes Absolute: 0.5 10*3/uL (ref 0.1–1.0)
Monocytes Relative: 5 %
Neutro Abs: 6.9 10*3/uL (ref 1.7–7.7)
Neutrophils Relative %: 73 %
Platelets: 400 10*3/uL (ref 150–400)
RBC: 5.54 MIL/uL (ref 4.22–5.81)
RDW: 12.8 % (ref 11.5–15.5)
WBC: 9.5 10*3/uL (ref 4.0–10.5)
nRBC: 0 % (ref 0.0–0.2)

## 2022-04-04 LAB — RAPID URINE DRUG SCREEN, HOSP PERFORMED
Amphetamines: NOT DETECTED
Barbiturates: NOT DETECTED
Benzodiazepines: NOT DETECTED
Cocaine: NOT DETECTED
Opiates: NOT DETECTED
Tetrahydrocannabinol: NOT DETECTED

## 2022-04-04 LAB — ETHANOL: Alcohol, Ethyl (B): 386 mg/dL (ref <10)

## 2022-04-04 MED ORDER — STERILE WATER FOR INJECTION IJ SOLN
INTRAMUSCULAR | Status: AC
  Administered 2022-04-04: 1.2 mL
  Filled 2022-04-04: qty 10

## 2022-04-04 MED ORDER — LACTATED RINGERS IV BOLUS
2000.0000 mL | Freq: Once | INTRAVENOUS | Status: AC
  Administered 2022-04-05: 2000 mL via INTRAVENOUS

## 2022-04-04 MED ORDER — ZIPRASIDONE MESYLATE 20 MG IM SOLR
10.0000 mg | Freq: Once | INTRAMUSCULAR | Status: AC
  Administered 2022-04-04: 10 mg via INTRAMUSCULAR
  Filled 2022-04-04: qty 20

## 2022-04-04 MED ORDER — LORAZEPAM 2 MG/ML IJ SOLN
2.0000 mg | Freq: Once | INTRAMUSCULAR | Status: AC
  Administered 2022-04-04: 2 mg via INTRAMUSCULAR
  Filled 2022-04-04: qty 1

## 2022-04-04 NOTE — ED Notes (Signed)
Patient refused CT scans multiple times.

## 2022-04-04 NOTE — ED Provider Notes (Cosign Needed Addendum)
[facility] Provider Note   CSN: [id number] Arrival date & time: 04/04/22  1742     History  Chief Complaint  Patient presents with   Alcohol Intoxication    Thomas [name] is a 32 y.o. male.  He presents the ED today with EMS.  EMS reports that he had been kicked out of his hotel and patient was on side of the road drinking beer surrounded by empty cans.  They state that noted a hematoma to his left supraorbital area but patient cannot feel them when this happened, the pressure that this was remote trauma or acute.  Patient has no complaints but is trying to get up and around.  HPI     Home Medications Prior to Admission medications   Medication Sig Start Date End Date Taking? Authorizing Provider  chlordiazePOXIDE (LIBRIUM) 25 MG capsule Please take 25 mg oral 3 times daily x3 days, then 25 mg oral twice daily x3  days, then 25 mg oral daily x3 days then stop. 11/21/21   Elgergawy, Thomas Huguenin, MD  folic acid (FOLVITE) 1 MG tablet Take 1 tablet (1 mg total) by mouth daily. 11/22/21   Elgergawy, Thomas Huguenin, MD  hydrOXYzine (ATARAX) 50 MG tablet Take 1 tablet (50 mg total) by mouth 3 (three) times daily as needed for anxiety (or sleep). Patient not taking: Reported on 11/20/2021 08/16/21   Lindell Spar I, NP  Multiple Vitamin (MULTIVITAMIN WITH MINERALS) TABS tablet Take 1 tablet by mouth daily. 11/22/21   Elgergawy, Thomas Huguenin, MD  thiamine (VITAMIN B1) 100 MG tablet Take 1 tablet (100 mg total) by mouth daily. 11/22/21   Elgergawy, Thomas Huguenin, MD      Allergies    Patient has no known allergies.    Review of Systems   Review of Systems  Physical Exam Updated Vital Signs BP 128/85 (BP Location: Right Arm)   Pulse (!) 111   Temp 98.2 F (36.8 C) (Oral)   Resp 18   Ht 6' (1.829 m)   Wt 73.6 kg   SpO2 94%   BMI 22.01 kg/m  Physical Exam Vitals and nursing note reviewed.  Constitutional:      General: He is not in acute  distress.    Appearance: He is well-developed.  HENT:     Head: Normocephalic.     Comments: Mild hematoma noted to left forehead    Right Ear: Tympanic membrane normal. No hemotympanum.     Left Ear: Tympanic membrane normal. No hemotympanum.     Ears:     Comments: Abrasion noted to right pinna with some dried blood Eyes:     Conjunctiva/sclera: Conjunctivae normal.  Cardiovascular:     Rate and Rhythm: Normal rate and regular rhythm.     Heart sounds: No murmur heard. Pulmonary:     Effort: Pulmonary effort is normal. No respiratory distress.     Breath sounds: Normal breath sounds.  Chest:     Chest wall: No lacerations, deformity, swelling, tenderness or crepitus.  Abdominal:     General: There is no distension. There are no signs of injury.     Palpations: Abdomen is soft. There is no mass.     Tenderness: There is no abdominal tenderness.  Musculoskeletal:        General: No swelling.     Cervical back: Normal and neck supple.     Thoracic back: Normal.     Lumbar back: Normal.  Skin:  General: Skin is warm and dry.     Capillary Refill: Capillary refill takes less than 2 seconds.  Neurological:     Mental Status: He is alert and oriented to person, place, and time.     GCS: GCS eye subscore is 4. GCS verbal subscore is 5. GCS motor subscore is 6.  Psychiatric:        Mood and Affect: Mood normal.     ED Results / Procedures / Treatments   Labs (all labs ordered are listed, but only abnormal results are displayed) Labs Reviewed  CBC WITH DIFFERENTIAL/PLATELET - Abnormal; Notable for the following components:      Result Value   Hemoglobin 17.2 (*)    All other components within normal limits  COMPREHENSIVE METABOLIC PANEL - Abnormal; Notable for the following components:   Chloride 97 (*)    CO2 20 (*)    Glucose, Bld 109 (*)    BUN 5 (*)    Total Protein 8.5 (*)    Anion gap 23 (*)    All other components within normal limits  ETHANOL - Abnormal;  Notable for the following components:   Alcohol, Ethyl (B) 386 (*)    All other components within normal limits  RAPID URINE DRUG SCREEN, HOSP PERFORMED    EKG None  Radiology No results found.  Procedures Procedures    Medications Ordered in ED Medications  ziprasidone (GEODON) injection 10 mg (10 mg Intramuscular Given 04/04/22 1929)  LORazepam (ATIVAN) injection 2 mg (2 mg Intramuscular Given 04/04/22 1930)  sterile water (preservative free) injection (1.2 mLs  Given 04/04/22 1930)    ED Course/ Medical Decision Making/ A&P Clinical Course as of 04/04/22 2300  Sat Apr 04, 2022  1803 Patient brought in by EMS as a level 2 trauma alert.  He was kicked out of the hotel he was staying in and EMS found him standing on the side of the road with a case of beer and many empty cans of beer, he was able to state his name and that tomorrow is his birthday and he was to call his parents but is unable to give much history.  They noted a hematoma to the left forehead but patient cannot tell them when this happened.  Denies any complaints but his urinated all over himself.  Trauma alert called due to possible trauma with intoxication.  Patient is ambulatory on arrival, difficult to redirect.  ABCs all intact, he is oriented to person place and time. [CB]    Clinical Course User Index [CB] [name], PA-C                             Medical Decision Making Differential diagnosis: Intoxication, dehydration, alcohol withdrawal, intracranial hemorrhage, concussion, other  ED course: Patient presents the ED via EMS.  Patient was obviously intoxicated with long history of alcohol abuse including alcohol withdrawal as well.  He was unable to tell us when he sustained the bruise to his forehead.  No other signs of trauma, no complaints of pain.  Plan was for imaging to rule out intracranial hemorrhage but patient was adamantly refusing.  Even after Geodon and Ativan he states he does not want to  have the CT scans.  He is alert and oriented but still somewhat slurring his speech.  Discussed with patient we can observe continue to observe him until he reaches clinical sobriety.  I reviewed his labs, low  CO2 likely due to his alcoholic ketosis as well as his increased anion gap.  He pulled out his IVs so we cannot give him IV fluids.  Continuing to observe patient.  Patient also seen by my attending.  Will need to watch closely for signs of alcohol withdrawal which she has history of.  Is sleeping but easily wakes up to voice and touch  Amount and/or Complexity of Data Reviewed Labs: ordered. Radiology: ordered.  Risk Prescription drug management.       Patient now sleeping after Geodon, still wakes up to refuse CT scans but will try to get IV to draw some more labs and give him fluids due to his increased anion gap to rule out other causes.  Signed out to oncoming team    Final Clinical Impression(s) / ED Diagnoses Final diagnoses:  Acute alcoholic intoxication without complication [facility])  Homeless    Rx / DC Orders ED Discharge Orders     None         [name], PA-C 04/04/22 2301    [name], PA-C 04/05/22 0017

## 2022-04-04 NOTE — ED Triage Notes (Signed)
Pt to ED via [facility].  He was kicked out of the hotel he was staying in today after he was found "tearing it up".  EMS was called and pt was found laying on the side of the road near the hotel, with an empty case of beer beside him.  Minor scratches noted to R ear.

## 2022-04-04 NOTE — ED Notes (Signed)
EDP explained importance of CT scan for patient , pt. refused CT scan.

## 2022-04-05 ENCOUNTER — Emergency Department (HOSPITAL_COMMUNITY): Payer: Medicaid Other

## 2022-04-05 LAB — URINALYSIS, ROUTINE W REFLEX MICROSCOPIC
Bacteria, UA: NONE SEEN
Bilirubin Urine: NEGATIVE
Glucose, UA: NEGATIVE mg/dL
Ketones, ur: 5 mg/dL — AB
Leukocytes,Ua: NEGATIVE
Nitrite: NEGATIVE
Protein, ur: 30 mg/dL — AB
Specific Gravity, Urine: 1.003 — ABNORMAL LOW (ref 1.005–1.030)
pH: 6 (ref 5.0–8.0)

## 2022-04-05 LAB — I-STAT VENOUS BLOOD GAS, ED
Acid-Base Excess: 3 mmol/L — ABNORMAL HIGH (ref 0.0–2.0)
Bicarbonate: 27.9 mmol/L (ref 20.0–28.0)
Calcium, Ion: 1 mmol/L — ABNORMAL LOW (ref 1.15–1.40)
HCT: 50 % (ref 39.0–52.0)
Hemoglobin: 17 g/dL (ref 13.0–17.0)
O2 Saturation: 94 %
Potassium: 4.2 mmol/L (ref 3.5–5.1)
Sodium: 139 mmol/L (ref 135–145)
TCO2: 29 mmol/L (ref 22–32)
pCO2, Ven: 42.5 mmHg — ABNORMAL LOW (ref 44–60)
pH, Ven: 7.425 (ref 7.25–7.43)
pO2, Ven: 71 mmHg — ABNORMAL HIGH (ref 32–45)

## 2022-04-05 LAB — LACTIC ACID, PLASMA: Lactic Acid, Venous: 3.5 mmol/L (ref 0.5–1.9)

## 2022-04-05 LAB — SALICYLATE LEVEL: Salicylate Lvl: <7 mg/dL — ABNORMAL LOW (ref 7.0–30.0)

## 2022-04-05 NOTE — ED Notes (Signed)
PA at bedside speaking with pt

## 2022-04-05 NOTE — ED Notes (Signed)
Patient ice chips and 3 cups of juice to drink.

## 2022-04-05 NOTE — Discharge Instructions (Signed)
Alcohol intoxication-if you need assistance with alcohol use, have given you resources for your review. Anxiety-May follow-up with ED 24-hour behavioral health urgent care located here in [place].  You are leaving against medical advice, I recommend further evaluation since you have abnormal lab work which could lead to loss of life or limb, I would recommend that coming back for further evaluation if you feel like your symptoms are getting worse.

## 2022-04-05 NOTE — ED Notes (Addendum)
Patient's mother mother notified on patient's condition and plan of care . Mother stated that she can not transport him when is is discharge.

## 2022-04-05 NOTE — ED Notes (Addendum)
PA gave him additional juice to drink . NS IV bolus infusing.

## 2022-04-05 NOTE — ED Notes (Signed)
PA notified that patient wants to leave AMA .

## 2022-04-05 NOTE — ED Provider Notes (Signed)
Received at shift change from [facility], PA-C please see note for full detail  In short patient with medical history including anxiety, depression, polysubstance dependency, alcohol dependency, presenting after being found on the ground.  Patient states that he was kicked out of the hotel, and laid on the ground against some bushes, he states that he did not fall or hit his head, he states that he was drinking alcohol earlier in the day, states he drank about 24 beers, he states he does this daily, this is not an intent to hurt himself, he drinks due to his anxiety, he has no complaints at this time, he does not endorse any headaches change in vision paresthesias or weakness the upper extremities. no neck pain back pain chest pain abdominal pain no pain in the upper or lower extremities.  He denies any suicidal homicidal ideations at this time.  Per previous provider allow patient metabolize and reassess lab work.   Physical Exam  BP 128/85 (BP Location: Right Arm)   Pulse (!) 111   Temp 98.2 F (36.8 C) (Oral)   Resp 18   Ht 6' (1.829 m)   Wt 73.6 kg   SpO2 94%   BMI 22.01 kg/m   Physical Exam Vitals and nursing note reviewed.  Constitutional:      General: He is not in acute distress.    Appearance: He is not ill-appearing.  HENT:     Head: Normocephalic and atraumatic.     Comments: No raccoon eyes or Battle sign noted,  No gross deformities that present  Patient has a small abrasion noted on his right ear and a small abrasion on his right cheekbone both which are hemodynamically stable.    Nose: No congestion.     Mouth/Throat:     Mouth: Mucous membranes are moist.     Pharynx: Oropharynx is clear.     Comments: No trismus no torticollis no oral trauma present. Eyes:     Extraocular Movements: Extraocular movements intact.     Conjunctiva/sclera: Conjunctivae normal.     Pupils: Pupils are equal, round, and reactive to light.  Cardiovascular:     Rate and Rhythm:  Normal rate and regular rhythm.     Pulses: Normal pulses.     Heart sounds: No murmur heard.    No friction rub. No gallop.  Pulmonary:     Effort: No respiratory distress.     Breath sounds: No wheezing, rhonchi or rales.  Abdominal:     Palpations: Abdomen is soft.     Tenderness: There is no abdominal tenderness. There is no right CVA tenderness or left CVA tenderness.  Musculoskeletal:     Comments: Spine was palpated was nontender to palpation no step-off or deformities noted no pelvis instability no leg shortening, moving his upper and lower extremities  without difficulty.  Skin:    General: Skin is warm and dry.  Neurological:     Mental Status: He is alert.     Comments: No facial asymmetry no difficulty with word finding following two-step commands there is no unilateral weakness present.  Psychiatric:        Mood and Affect: Mood normal.     Comments: Patient denies suicidal homicidal ideations, does not appear to be respond to internal stimuli, responding appropriate to all questions.     Procedures  Procedures  ED Course / MDM   Clinical Course as of 04/05/22 0254  Sat Apr 04, 2022  1803 Patient brought in  by EMS as a level 2 trauma alert.  He was kicked out of the hotel he was staying in and EMS found him standing on the side of the road with a case of beer and many empty cans of beer, he was able to state his name and that tomorrow is his birthday and he was to call his parents but is unable to give much history.  They noted a hematoma to the left forehead but patient cannot tell them when this happened.  Denies any complaints but his urinated all over himself.  Trauma alert called due to possible trauma with intoxication.  Patient is ambulatory on arrival, difficult to redirect.  ABCs all intact, he is oriented to person place and time. [CB]    Clinical Course User Index [CB] [name], PA-C   Medical Decision Making Amount and/or Complexity of Data  Reviewed Labs: ordered. Radiology: ordered.  Risk Prescription drug management.    I Ordered, and personally interpreted labs.  The pertinent results include: CBC is unremarkable, CMP reveals chloride of 97 and CO2 of 20, glucose 109, BUN 5, anion gap 23, rapid urine drug screen negative, ethanol 386, VBG unremarkable, UA shows ketones proteins lactic 3.5   Imaging Studies ordered:  I ordered imaging studies including N/A I independently visualized and interpreted imaging which showed N/A I agree with the radiologist interpretation   Cardiac Monitoring:  The patient was maintained on a cardiac monitor.  I personally viewed and interpreted the cardiac monitored which showed an underlying rhythm of: N/A   Medicines ordered and prescription drug management:  I ordered medication including fluids I have reviewed the patients home medicines and have made adjustments as needed  Critical Interventions:  N/A   Reevaluation:  Patient was reassessed, he is alert, oriented, tells me that he has had no falls, he states that the abrasion on his right cheek and ear was because he laid on a bush, states he has no complaints at this time.  Patient still deferring on CT head, will provide 2 L of fluids, add on additional lab work for further workup of anion gap.  And reassess  Notified by nursing staff that patient like to leave AMA, I reassessed the patient, states that he just wants more juice, will provide him with additional juice.  Notified by nurse of again that patient like to leave AMA, I reassessed the patient, states that he just wants more juice and he will remain for further evaluation.  Will from [facility] continue to assess  Reassessed the patient, states that he no longer wants to stay here, I explained that his lab work is abnormal which includes a lactic of 3.5, anion gap of 23, I explained that likely this could be from dehydration as well as alcohol de use but I cannot  exclude other etiologies like infection, ketoacidosis, methanol ingestion, this could lead to loss of life or limb.  Patient states that he understood but would like to leave AMA.   Consultations Obtained:  N/A    Test Considered:  CT head, C-spine-this was deferred, patient feels that he does not need this, I find this reasonable as I do not note any evidence of significant head trauma, patient has been observed for greater than 8 hours without any focal deficits, my suspicion for head bleed or cervical spine fracture is very low at this time.    Rule out low suspicion for intracranial head bleed as patient denies loss of conscious, is not  on anticoagulant, he does not endorse headaches, paresthesia/weakness in the upper and lower extremities, no focal deficits present on my exam.  Low suspicion for spinal cord abnormality or spinal fracture spine was palpated was nontender to palpation, patient has full range of motion in the upper and lower extremities.  Suspicion for thoracic/abdominal trauma is low as there is no evident trauma on my exam both which are nontender to palpation.  Suspicion for DKA or HHS is also low at this time he has no decrease in pH, glucose is not significantly elevated, suspect the anion gap as well as decreased A2 is likely from dehydration.  My suspicion for systemic infection is low at this time as he is nontoxic-appearing, vital signs are reassuring, he does have a lactic of 3.5 but likely secondary due to dehydration and poor oral intake.    Dispostion and problem list  After consideration of the diagnostic results and the patients response to treatment, I feel that the patent would benefit from [facility].  Alcohol intoxication-patient was given resources for outpatient follow-up  Elevated anion gap-patient was made aware of his abnormal lab work, explained that if his symptoms were to worsen he needs to follow-up for further evaluation.           Marcello Fennel, PA-C 04/05/22 0254    Orpah Greek, MD 04/05/22 0700

## 2022-04-17 ENCOUNTER — Other Ambulatory Visit: Payer: Self-pay

## 2022-04-17 ENCOUNTER — Emergency Department (HOSPITAL_COMMUNITY)
Admission: EM | Admit: 2022-04-17 | Discharge: 2022-04-17 | Discharge status: Against Medical Advice | Payer: Medicaid Other | Attending: Emergency Medicine | Admitting: Emergency Medicine

## 2022-04-17 ENCOUNTER — Encounter (HOSPITAL_COMMUNITY): Payer: Self-pay

## 2022-04-17 DIAGNOSIS — F10120 Alcohol abuse with intoxication, uncomplicated: Secondary | ICD-10-CM | POA: Insufficient documentation

## 2022-04-17 DIAGNOSIS — Z79899 Other long term (current) drug therapy: Secondary | ICD-10-CM | POA: Diagnosis not present

## 2022-04-17 DIAGNOSIS — Y908 Blood alcohol level of 240 mg/100 ml or more: Secondary | ICD-10-CM | POA: Insufficient documentation

## 2022-04-17 DIAGNOSIS — R1110 Vomiting, unspecified: Secondary | ICD-10-CM | POA: Diagnosis present

## 2022-04-17 DIAGNOSIS — F10920 Alcohol use, unspecified with intoxication, uncomplicated: Secondary | ICD-10-CM

## 2022-04-17 LAB — CBC WITH DIFFERENTIAL/PLATELET
Abs Immature Granulocytes: 0.01 10*3/uL (ref 0.00–0.07)
Basophils Absolute: 0.1 10*3/uL (ref 0.0–0.1)
Basophils Relative: 1 %
Eosinophils Absolute: 0.1 10*3/uL (ref 0.0–0.5)
Eosinophils Relative: 2 %
HCT: 45.1 % (ref 39.0–52.0)
Hemoglobin: 15.7 g/dL (ref 13.0–17.0)
Immature Granulocytes: 0 %
Lymphocytes Relative: 37 %
Lymphs Abs: 2.7 10*3/uL (ref 0.7–4.0)
MCH: 31.9 pg (ref 26.0–34.0)
MCHC: 34.8 g/dL (ref 30.0–36.0)
MCV: 91.7 fL (ref 80.0–100.0)
Monocytes Absolute: 0.6 10*3/uL (ref 0.1–1.0)
Monocytes Relative: 8 %
Neutro Abs: 3.7 10*3/uL (ref 1.7–7.7)
Neutrophils Relative %: 52 %
Platelets: 274 10*3/uL (ref 150–400)
RBC: 4.92 MIL/uL (ref 4.22–5.81)
RDW: 13.3 % (ref 11.5–15.5)
WBC: 7.2 10*3/uL (ref 4.0–10.5)
nRBC: 0 % (ref 0.0–0.2)

## 2022-04-17 LAB — COMPREHENSIVE METABOLIC PANEL
ALT: 83 U/L — ABNORMAL HIGH (ref 0–44)
AST: 78 U/L — ABNORMAL HIGH (ref 15–41)
Albumin: 4.5 g/dL (ref 3.5–5.0)
Alkaline Phosphatase: 78 U/L (ref 38–126)
Anion gap: 13 (ref 5–15)
BUN: <5 mg/dL — ABNORMAL LOW (ref 6–20)
CO2: 28 mmol/L (ref 22–32)
Calcium: 8.4 mg/dL — ABNORMAL LOW (ref 8.9–10.3)
Chloride: 99 mmol/L (ref 98–111)
Creatinine, Ser: 0.93 mg/dL (ref 0.61–1.24)
GFR, Estimated: >60 mL/min (ref >60)
Glucose, Bld: 115 mg/dL — ABNORMAL HIGH (ref 70–99)
Potassium: 4.1 mmol/L (ref 3.5–5.1)
Sodium: 140 mmol/L (ref 135–145)
Total Bilirubin: 0.8 mg/dL (ref 0.3–1.2)
Total Protein: 8.5 g/dL — ABNORMAL HIGH (ref 6.5–8.1)

## 2022-04-17 LAB — RAPID URINE DRUG SCREEN, HOSP PERFORMED
Amphetamines: NOT DETECTED
Barbiturates: NOT DETECTED
Benzodiazepines: NOT DETECTED
Cocaine: NOT DETECTED
Opiates: NOT DETECTED
Tetrahydrocannabinol: NOT DETECTED

## 2022-04-17 LAB — ETHANOL: Alcohol, Ethyl (B): 453 mg/dL (ref <10)

## 2022-04-17 MED ORDER — SODIUM CHLORIDE 0.9 % IV BOLUS
1000.0000 mL | Freq: Once | INTRAVENOUS | Status: AC
  Administered 2022-04-17: 1000 mL via INTRAVENOUS

## 2022-04-17 MED ORDER — LACTATED RINGERS IV BOLUS
1000.0000 mL | Freq: Once | INTRAVENOUS | Status: AC
  Administered 2022-04-17: 1000 mL via INTRAVENOUS

## 2022-04-17 NOTE — ED Notes (Signed)
Pt resting in bed with no acute distress noted at this time.  

## 2022-04-17 NOTE — ED Notes (Signed)
Pt requested to leave the ED against medical advice. Pt was interviewed by provider and was found to be Aox4, able to ambulate without assistance, and answered all questions appropriately. He attempted to get a friend to pick him up but was unable to obtain transportation. Pt further insisted to provider that he would be leaving no matter what. IV was removed and patient was escorted to the exit. Security was also notified of patients departure.

## 2022-04-17 NOTE — ED Provider Notes (Signed)
[place] EMERGENCY DEPARTMENT AT [facility] Provider Note   CSN: [id number] Arrival date & time: 04/17/22  1735     History {Add pertinent medical, surgical, social history, OB history to HPI:1} Chief Complaint  Patient presents with   Alcohol Intoxication    Thomas [name] is a 32 y.o. male brought to the ED by EMS after being found in his car surrounding by beer cans.  EMS estimates there were approximately 20 cans around the patient.  There was also vomit and urine in the car.  Patient complaining of headache with EMS.  Unable to obtain ROS while patient in ED due to him being somnolent.         Home Medications Prior to Admission medications   Medication Sig Start Date End Date Taking? Authorizing Provider  chlordiazePOXIDE (LIBRIUM) 25 MG capsule Please take 25 mg oral 3 times daily x3 days, then 25 mg oral twice daily x3  days, then 25 mg oral daily x3 days then stop. 11/21/21   Elgergawy, Leana Roe, MD  folic acid (FOLVITE) 1 MG tablet Take 1 tablet (1 mg total) by mouth daily. 11/22/21   Elgergawy, Leana Roe, MD  hydrOXYzine (ATARAX) 50 MG tablet Take 1 tablet (50 mg total) by mouth 3 (three) times daily as needed for anxiety (or sleep). Patient not taking: Reported on 11/20/2021 08/16/21   Armandina Stammer I, NP  Multiple Vitamin (MULTIVITAMIN WITH MINERALS) TABS tablet Take 1 tablet by mouth daily. 11/22/21   Elgergawy, Leana Roe, MD  thiamine (VITAMIN B1) 100 MG tablet Take 1 tablet (100 mg total) by mouth daily. 11/22/21   Elgergawy, Leana Roe, MD      Allergies    Patient has no known allergies.    Review of Systems   Review of Systems  Unable to perform ROS: Mental status change    Physical Exam Updated Vital Signs BP 91/66   Pulse 78   Temp 98.5 F (36.9 C) (Oral)   Resp 15   Ht 6' (1.829 m)   Wt 75 kg   SpO2 100%   BMI 22.42 kg/m  Physical Exam Vitals and nursing note reviewed.  Constitutional:      General: He is not in acute  distress.    Appearance: Normal appearance. He is not ill-appearing or diaphoretic.  HENT:     Head: Normocephalic and atraumatic.  Eyes:     General: Lids are normal.     Conjunctiva/sclera: Conjunctivae normal.     Pupils: Pupils are equal, round, and reactive to light.  Cardiovascular:     Rate and Rhythm: Normal rate and regular rhythm.  Pulmonary:     Effort: Pulmonary effort is normal.     Breath sounds: Normal breath sounds and air entry.  Abdominal:     General: Abdomen is flat.     Palpations: Abdomen is soft.     Tenderness: There is no abdominal tenderness.  Skin:    General: Skin is warm and dry.     Capillary Refill: Capillary refill takes less than 2 seconds.     Coloration: Skin is not cyanotic, jaundiced or pale.  Neurological:     Mental Status: He is alert and oriented to person, place, and time. Mental status is at baseline.  Psychiatric:        Mood and Affect: Mood normal.        Behavior: Behavior normal.     ED Results / Procedures / Treatments   Labs (  all labs ordered are listed, but only abnormal results are displayed) Labs Reviewed  COMPREHENSIVE METABOLIC PANEL - Abnormal; Notable for the following components:      Result Value   Glucose, Bld 115 (*)    BUN <5 (*)    Calcium 8.4 (*)    Total Protein 8.5 (*)    AST 78 (*)    ALT 83 (*)    All other components within normal limits  ETHANOL - Abnormal; Notable for the following components:   Alcohol, Ethyl (B) 453 (*)    All other components within normal limits  RAPID URINE DRUG SCREEN, HOSP PERFORMED  CBC WITH DIFFERENTIAL/PLATELET    EKG None  Radiology No results found.  Procedures Procedures  {Document cardiac monitor, telemetry assessment procedure when appropriate:1}  Medications Ordered in ED Medications  sodium chloride 0.9 % bolus 1,000 mL (0 mLs Intravenous Stopped 04/17/22 1942)  lactated ringers bolus 1,000 mL (1,000 mLs Intravenous New Bag/Given 04/17/22 1942)    ED  Course/ Medical Decision Making/ A&P   {   Click here for ABCD2, HEART and other calculatorsREFRESH Note before signing :1}                          Medical Decision Making Amount and/or Complexity of Data Reviewed Labs: ordered.   This patient presents to the ED with chief complaint(s) of alcohol intoxication with pertinent past medical history of alcohol abuse.  The complaint involves an extensive differential diagnosis and also carries with it a high risk of complications and morbidity.    The differential diagnosis includes alcohol intoxication, toxic ingestion, drug ingestion   The initial plan is to ***  Additional history obtained: Additional history obtained from {additional history:26846} Records reviewed {records:26847}  Initial Assessment:   On initial assessment, patient was somnolent and would not wake up enough to answer questions.  Patient appears to be intoxicated.  No obvious signs of injury or trauma.  Heart rate is normal with regular rhythm.  Respirations are normal and unlabored.  Patient's jeans in the corner are soaked with urine.    Independent ECG/labs interpretation:  The following labs were independently interpreted:  ***  Treatment and Reassessment: Patient was given IV fluids and allowed to metabolize to freedom.  Informed by RN that patient awoke, and pulled out IV fluids and was requesting food.  Upon reassessment, patient is awake, alert and answering questions appropriately.  Patient called his friend and asked for this provider to speak with friend.  Friend wished to come pick patient up, however, patient did not wish to wait for his friend.  Patient is walking with a steady gait without assistance.  He is speaking in full sentences.  He is able to state where he is currently and where he was prior to arrival at the ED.  Currently denying SI, HI, or consuming alcohol with intent to harm himself.    Disposition:   Patient is now awake, answering  questions, is completely oriented and requesting discharge.  Patient does not wish to wait for his friend to come pick him up and would like to just walk out.  Patient is appropriate for discharge.  He left prior to receiving paperwork or full discharge instructions.    The patient has been appropriately medically screened and/or stabilized in the ED. I have low suspicion for any other emergent medical condition which would require further screening, evaluation or treatment in the ED or require  inpatient management. At time of discharge the patient is hemodynamically stable and in no acute distress.   Social Determinants of Health:   Patient's  alcohol abuse   increases the complexity of managing their presentation   {Document critical care time when appropriate:1} {Document review of labs and clinical decision tools ie heart score, Chads2Vasc2 etc:1}  {Document your independent review of radiology images, and any outside records:1} {Document your discussion with family members, caretakers, and with consultants:1} {Document social determinants of health affecting pt's care:1} {Document your decision making why or why not admission, treatments were needed:1} Final Clinical Impression(s) / ED Diagnoses Final diagnoses:  None    Rx / DC Orders ED Discharge Orders     None

## 2022-04-17 NOTE — ED Triage Notes (Signed)
Patient BIB [facility] after sitting in his car with a vehicle full of beer cans. (Estimating 20). Car full of vomit and urine. Complaining of a headache.

## 2022-04-30 ENCOUNTER — Telehealth (HOSPITAL_COMMUNITY): Payer: Self-pay | Admitting: Licensed Clinical Social Worker

## 2022-04-30 NOTE — Telephone Encounter (Signed)
The therapist returns Kolby's call confirming his identity via two identifiers. He went to [facility] from 02/17/22 to 03/26/22. He relapsed after he got out and has not drunk in two weeks. He is starting a new job at [employer] facility working from 5 p.m. to 1 a.m. The facility is in [place]. He is presently staying with his mom on [address] in [place]. He is going to go to the [facility] to see if he can activate his license as he had a couple of DWIs last year.   He says that if he cannot get his license activated that his mother will drive him back and forth to work. This therapist suggests that if he does not get his license and attends CD IOP that his mother would also have to drive him back and forth to this program as well and that the hours of his new job and the start time for CD IOP would basically leave him little time to sleep.  The therapist suggests that if he were to attend CD IOP that contacting the [facility] would likely be a better option as they have afternoon and evening programing that may work better with his work schedule. Thomas [name] says that he will take the number but then asks if there is something less intensive than CD IOP with the therapist informing him about individual therapy.   Deshone says, "I will sign up for that" so the therapist informs him that he will have the Receptionist in OP at the [facility] call him to get scheduled with a therapist.   Myrna Blazer, MA, LCSW, [facility], LCAS 04/30/2022

## 2022-06-11 ENCOUNTER — Ambulatory Visit (HOSPITAL_COMMUNITY): Payer: Medicaid Other | Admitting: Clinical

## 2022-06-11 ENCOUNTER — Encounter (HOSPITAL_COMMUNITY): Payer: Self-pay

## 2022-06-12 ENCOUNTER — Telehealth (HOSPITAL_COMMUNITY): Payer: Self-pay | Admitting: Clinical

## 2022-06-12 NOTE — Telephone Encounter (Signed)
Therapist attempted via phone and virtual to make contact with the client for the scheduled appointment. Therapist was unable to make contact with the client.

## 2023-05-07 ENCOUNTER — Emergency Department (HOSPITAL_COMMUNITY): Payer: MEDICAID

## 2023-05-07 ENCOUNTER — Other Ambulatory Visit: Payer: Self-pay

## 2023-05-07 ENCOUNTER — Other Ambulatory Visit (HOSPITAL_COMMUNITY): Payer: Self-pay

## 2023-05-07 ENCOUNTER — Emergency Department (HOSPITAL_COMMUNITY)
Admission: EM | Admit: 2023-05-07 | Discharge: 2023-05-07 | Disposition: A | Discharge status: Home / Self Care | Payer: MEDICAID | Attending: Emergency Medicine | Admitting: Emergency Medicine

## 2023-05-07 DIAGNOSIS — E876 Hypokalemia: Secondary | ICD-10-CM | POA: Diagnosis not present

## 2023-05-07 DIAGNOSIS — D72829 Elevated white blood cell count, unspecified: Secondary | ICD-10-CM | POA: Diagnosis not present

## 2023-05-07 DIAGNOSIS — F1010 Alcohol abuse, uncomplicated: Secondary | ICD-10-CM | POA: Insufficient documentation

## 2023-05-07 DIAGNOSIS — R7401 Elevation of levels of liver transaminase levels: Secondary | ICD-10-CM | POA: Insufficient documentation

## 2023-05-07 DIAGNOSIS — E860 Dehydration: Secondary | ICD-10-CM | POA: Diagnosis not present

## 2023-05-07 DIAGNOSIS — Y907 Blood alcohol level of 200-239 mg/100 ml: Secondary | ICD-10-CM | POA: Insufficient documentation

## 2023-05-07 DIAGNOSIS — E871 Hypo-osmolality and hyponatremia: Secondary | ICD-10-CM | POA: Diagnosis not present

## 2023-05-07 LAB — COMPREHENSIVE METABOLIC PANEL WITH GFR
ALT: 69 U/L — ABNORMAL HIGH (ref 0–44)
AST: 65 U/L — ABNORMAL HIGH (ref 15–41)
Albumin: 2.9 g/dL — ABNORMAL LOW (ref 3.5–5.0)
Alkaline Phosphatase: 111 U/L (ref 38–126)
Anion gap: 14 (ref 5–15)
BUN: 14 mg/dL (ref 6–20)
CO2: 29 mmol/L (ref 22–32)
Calcium: 7.8 mg/dL — ABNORMAL LOW (ref 8.9–10.3)
Chloride: 88 mmol/L — ABNORMAL LOW (ref 98–111)
Creatinine, Ser: 0.75 mg/dL (ref 0.61–1.24)
GFR, Estimated: >60 mL/min (ref >60)
Glucose, Bld: 115 mg/dL — ABNORMAL HIGH (ref 70–99)
Potassium: 3.3 mmol/L — ABNORMAL LOW (ref 3.5–5.1)
Sodium: 131 mmol/L — ABNORMAL LOW (ref 135–145)
Total Bilirubin: 1.1 mg/dL (ref 0.0–1.2)
Total Protein: 6.5 g/dL (ref 6.5–8.1)

## 2023-05-07 LAB — CBC WITH DIFFERENTIAL/PLATELET
Abs Immature Granulocytes: 0.23 10*3/uL — ABNORMAL HIGH (ref 0.00–0.07)
Basophils Absolute: 0 10*3/uL (ref 0.0–0.1)
Basophils Relative: 0 %
Eosinophils Absolute: 0.2 10*3/uL (ref 0.0–0.5)
Eosinophils Relative: 1 %
HCT: 47.2 % (ref 39.0–52.0)
Hemoglobin: 17 g/dL (ref 13.0–17.0)
Immature Granulocytes: 1 %
Lymphocytes Relative: 9 %
Lymphs Abs: 1.9 10*3/uL (ref 0.7–4.0)
MCH: 31.8 pg (ref 26.0–34.0)
MCHC: 36 g/dL (ref 30.0–36.0)
MCV: 88.4 fL (ref 80.0–100.0)
Monocytes Absolute: 2.5 10*3/uL — ABNORMAL HIGH (ref 0.1–1.0)
Monocytes Relative: 11 %
Neutro Abs: 17.4 10*3/uL — ABNORMAL HIGH (ref 1.7–7.7)
Neutrophils Relative %: 78 %
Platelets: 394 10*3/uL (ref 150–400)
RBC: 5.34 MIL/uL (ref 4.22–5.81)
RDW: 12 % (ref 11.5–15.5)
WBC: 22.2 10*3/uL — ABNORMAL HIGH (ref 4.0–10.5)
nRBC: 0 % (ref 0.0–0.2)

## 2023-05-07 LAB — TROPONIN I (HIGH SENSITIVITY)
Troponin I (High Sensitivity): 8 ng/L (ref <18)
Troponin I (High Sensitivity): 8 ng/L (ref <18)

## 2023-05-07 LAB — MAGNESIUM: Magnesium: 2.3 mg/dL (ref 1.7–2.4)

## 2023-05-07 LAB — ETHANOL: Alcohol, Ethyl (B): 213 mg/dL — ABNORMAL HIGH (ref <15)

## 2023-05-07 MED ORDER — SODIUM CHLORIDE 0.9 % IV BOLUS
1000.0000 mL | Freq: Once | INTRAVENOUS | Status: AC
Start: 2023-05-07 — End: 2023-05-07
  Administered 2023-05-07: 1000 mL via INTRAVENOUS

## 2023-05-07 MED ORDER — ADULT MULTIVITAMIN W/MINERALS CH
1.0000 | ORAL_TABLET | Freq: Every day | ORAL | Status: DC
  Administered 2023-05-07: 1 via ORAL
  Filled 2023-05-07: qty 1

## 2023-05-07 MED ORDER — THIAMINE MONONITRATE 100 MG PO TABS
100.0000 mg | ORAL_TABLET | Freq: Every day | ORAL | Status: DC
  Administered 2023-05-07: 100 mg via ORAL
  Filled 2023-05-07: qty 1

## 2023-05-07 MED ORDER — MIDAZOLAM HCL 2 MG/2ML IJ SOLN
1.0000 mg | Freq: Once | INTRAMUSCULAR | Status: AC
  Administered 2023-05-07: 1 mg via INTRAVENOUS
  Filled 2023-05-07: qty 2

## 2023-05-07 MED ORDER — LORAZEPAM 1 MG PO TABS
1.0000 mg | ORAL_TABLET | Freq: Once | ORAL | Status: AC
Start: 2023-05-07 — End: 2023-05-07
  Administered 2023-05-07: 1 mg via ORAL
  Filled 2023-05-07: qty 1

## 2023-05-07 MED ORDER — ALUM & MAG HYDROXIDE-SIMETH 200-200-20 MG/5ML PO SUSP
30.0000 mL | Freq: Once | ORAL | Status: AC
  Administered 2023-05-07: 30 mL via ORAL
  Filled 2023-05-07: qty 30

## 2023-05-07 MED ORDER — FOLIC ACID 1 MG PO TABS
1.0000 mg | ORAL_TABLET | Freq: Every day | ORAL | Status: DC
  Administered 2023-05-07: 1 mg via ORAL
  Filled 2023-05-07: qty 1

## 2023-05-07 MED ORDER — HYDROXYZINE HCL 25 MG PO TABS
25.0000 mg | ORAL_TABLET | Freq: Once | ORAL | Status: AC
  Administered 2023-05-07: 25 mg via ORAL
  Filled 2023-05-07: qty 1

## 2023-05-07 MED ORDER — LIDOCAINE VISCOUS HCL 2 % MT SOLN
15.0000 mL | Freq: Once | OROMUCOSAL | Status: AC
  Administered 2023-05-07: 15 mL via ORAL
  Filled 2023-05-07: qty 15

## 2023-05-07 MED ORDER — POTASSIUM CHLORIDE 20 MEQ PO PACK
40.0000 meq | PACK | Freq: Once | ORAL | Status: AC
  Administered 2023-05-07: 40 meq via ORAL
  Filled 2023-05-07: qty 2

## 2023-05-07 MED ORDER — LIDOCAINE VISCOUS HCL 2 % MT SOLN
15.0000 mL | Freq: Once | OROMUCOSAL | Status: AC
  Administered 2023-05-07: 15 mL via OROMUCOSAL
  Filled 2023-05-07: qty 15

## 2023-05-07 MED ORDER — LIDOCAINE VISCOUS HCL 2 % MT SOLN
15.0000 mL | OROMUCOSAL | 0 refills | Status: DC | PRN
  Filled 2023-05-07: qty 100, 1d supply, fill #0
  Filled 2023-05-09: qty 100, 1d supply, fill #1

## 2023-05-07 MED ORDER — THIAMINE HCL 100 MG/ML IJ SOLN
100.0000 mg | Freq: Every day | INTRAMUSCULAR | Status: DC
  Filled 2023-05-07: qty 2

## 2023-05-07 MED ORDER — HYDROXYZINE HCL 25 MG PO TABS
25.0000 mg | ORAL_TABLET | Freq: Four times a day (QID) | ORAL | 0 refills | Status: DC | PRN
  Filled 2023-05-07: qty 20, 5d supply, fill #0

## 2023-05-07 MED ORDER — PANTOPRAZOLE SODIUM 40 MG IV SOLR
40.0000 mg | Freq: Once | INTRAVENOUS | Status: AC
  Administered 2023-05-07: 40 mg via INTRAVENOUS
  Filled 2023-05-07: qty 10

## 2023-05-07 MED ORDER — LACTATED RINGERS IV BOLUS
1000.0000 mL | Freq: Once | INTRAVENOUS | Status: AC
  Administered 2023-05-07: 1000 mL via INTRAVENOUS

## 2023-05-07 MED ORDER — LORAZEPAM 2 MG/ML IJ SOLN
2.0000 mg | Freq: Once | INTRAMUSCULAR | Status: AC
  Administered 2023-05-07: 2 mg via INTRAVENOUS
  Filled 2023-05-07: qty 1

## 2023-05-07 NOTE — Discharge Instructions (Addendum)
 You were seen here today for your alcohol use.  You were given fluids and medications to help with pain.  In the pain in your chest is likely due to irritation from the alcohol vomiting.  You have been prescribed a numbing medication called viscous lidocaine  to help with this discomfort.  You have been prescribed an anxiety medication called hydroxyzine . You may take this up to every 6 hours as needed for anxiety.  Please see the attached resources for help regarding your alcohol use.  You may also go to the behavioral health center listed below if you would like further help for your alcohol use disorder.  It is very unlikely that your pain is due to an issue in your heart. Your cardiac enzyme (troponin) was normal today. Your EKG which is a measure of the heart's electrical activity and rhythm is normal today. These would both show abnormalities if you were having a heart attack.  Your chest x-ray is normal today.  Please take a multivitamin at home as drinking can cause you to become deficient in many key nutrients.  You had slight elevation in your AST and ALT which are liver enzymes.  Please follow-up with your PCP within the next week to have these rechecked.  Return to the ER if you have any shortness of breath, difficulty breathing, worsening chest pain, tremors, hallucinations, any other new or concerning symptoms

## 2023-05-07 NOTE — ED Notes (Signed)
 Pt. Clothes soiled and does not want to leave in hospital attire. Pt. Called family to bring him some clothes from home. Pt. Family in room helping pt. Get dressed for discharge.

## 2023-05-07 NOTE — Progress Notes (Signed)
 TOC consulted for substance abuse resources. Resources provided and attached to AVS. No additional TOC needs.

## 2023-05-07 NOTE — ED Triage Notes (Addendum)
 Pt came in via EMS from hotel w/ c/o of anxiety and chest pain. Pt reports binging alcohol due to upcoming court date. Pt states he consumed 36 beers within the past 24 hours. Denies any other drug use. Pt

## 2023-05-07 NOTE — ED Notes (Signed)
 Pt. Resting quietly. Eyes closed. Chest rising and falling.

## 2023-05-07 NOTE — ED Provider Notes (Addendum)
 [place] EMERGENCY DEPARTMENT AT [facility] Provider Note   CSN: [id number] Arrival date & time: 05/07/23  4098     History  Chief Complaint  Patient presents with   Alcohol Intoxication   Chest Pain    Thomas [name] is a 33 y.o. male with alcohol use disorder, anxiety, presents with concern for drinking 36 previously last 24 hours.  Reports he is very stressed about an upcoming court date.  He denies drinking alcohol on a regular basis, just uses it as a coping mechanism for anxiety and stress.  Denies any intent to harm himself.  Denies any suicidal or homicidal ideation.  States he has never had any tremors or hallucinations after drinking and has not had any withdrawals.  Currently reporting a burning sensation in his chest and throat.  Denying any vomiting, hematemesis, shortness of breath.    Alcohol Intoxication Associated symptoms include chest pain.  Chest Pain      Home Medications Prior to Admission medications   Medication Sig Start Date End Date Taking? Authorizing Provider  hydrOXYzine  (ATARAX ) 25 MG tablet Take 1 tablet (25 mg total) by mouth every 6 (six) hours as needed for anxiety. 05/07/23  Yes Rexie Catena, PA-C  lidocaine  (XYLOCAINE ) 2 % solution Use as directed 15 mLs in the mouth or throat every 3 (three) hours as needed for mouth pain (Throat pain). 05/07/23  Yes Rexie Catena, PA-C  chlordiazePOXIDE  (LIBRIUM ) 25 MG capsule Please take 25 mg oral 3 times daily x3 days, then 25 mg oral twice daily x3  days, then 25 mg oral daily x3 days then stop. 11/21/21   Elgergawy, Ardia Kraft, MD  folic acid  (FOLVITE ) 1 MG tablet Take 1 tablet (1 mg total) by mouth daily. 11/22/21   Elgergawy, Ardia Kraft, MD  Multiple Vitamin (MULTIVITAMIN WITH MINERALS) TABS tablet Take 1 tablet by mouth daily. 11/22/21   Elgergawy, Ardia Kraft, MD  thiamine  (VITAMIN B1) 100 MG tablet Take 1 tablet (100 mg total) by mouth daily. 11/22/21   Elgergawy, Ardia Kraft, MD       Allergies    Patient has no known allergies.    Review of Systems   Review of Systems  Cardiovascular:  Positive for chest pain.    Physical Exam Updated Vital Signs BP 127/73   Pulse (!) 103   Temp 98.8 F (37.1 C)   Resp 19   SpO2 94%  Physical Exam Vitals and nursing note reviewed.  Constitutional:      General: He is not in acute distress.    Appearance: He is well-developed. He is not diaphoretic.     Comments: Curled up in bed, smelling of urine.  No active vomiting  No tremulousness  HENT:     Head: Normocephalic and atraumatic.     Mouth/Throat:     Comments: No tongue fasciculations Eyes:     Extraocular Movements: Extraocular movements intact.     Conjunctiva/sclera: Conjunctivae normal.     Pupils: Pupils are equal, round, and reactive to light.  Cardiovascular:     Rate and Rhythm: Normal rate and regular rhythm.     Heart sounds: No murmur heard. Pulmonary:     Effort: Pulmonary effort is normal. No respiratory distress.     Breath sounds: Normal breath sounds.  Abdominal:     Palpations: Abdomen is soft.     Tenderness: There is no abdominal tenderness.  Musculoskeletal:        General: No swelling.  Cervical back: Neck supple.  Skin:    General: Skin is warm and dry.     Capillary Refill: Capillary refill takes less than 2 seconds.  Neurological:     General: No focal deficit present.     Mental Status: He is alert.     Comments: Not responding to any internal stimuli  Psychiatric:        Mood and Affect: Mood normal.     Comments: Thought process normal     ED Results / Procedures / Treatments   Labs (all labs ordered are listed, but only abnormal results are displayed) Labs Reviewed  CBC WITH DIFFERENTIAL/PLATELET - Abnormal; Notable for the following components:      Result Value   WBC 22.2 (*)    Neutro Abs 17.4 (*)    Monocytes Absolute 2.5 (*)    Abs Immature Granulocytes 0.23 (*)    All other components within normal  limits  COMPREHENSIVE METABOLIC PANEL WITH GFR - Abnormal; Notable for the following components:   Sodium 131 (*)    Potassium 3.3 (*)    Chloride 88 (*)    Glucose, Bld 115 (*)    Calcium 7.8 (*)    Albumin 2.9 (*)    AST 65 (*)    ALT 69 (*)    All other components within normal limits  ETHANOL - Abnormal; Notable for the following components:   Alcohol, Ethyl (B) 213 (*)    All other components within normal limits  MAGNESIUM   TROPONIN I (HIGH SENSITIVITY)  TROPONIN I (HIGH SENSITIVITY)    EKG EKG Interpretation Date/Time:  Friday May 07 2023 06:20:42 EDT Ventricular Rate:  101 PR Interval:  114 QRS Duration:  93 QT Interval:  352 QTC Calculation: 457 R Axis:   70  Text Interpretation: Sinus tachycardia Confirmed by Palumbo, April ([id number]) on 05/07/2023 6:23:40 AM  Radiology DG Chest 2 View Result Date: 05/07/2023 CLINICAL DATA:  Chest pain EXAM: CHEST - 2 VIEW COMPARISON:  None Available. FINDINGS: The heart size and mediastinal contours are within normal limits. No consolidation, pneumothorax or effusion. No edema. Underinflation. The visualized skeletal structures are unremarkable. IMPRESSION: Underinflation.  No acute cardiopulmonary disease. Electronically Signed   By: Adrianna Horde M.D.   On: 05/07/2023 11:07    Procedures Procedures    Medications Ordered in ED Medications  thiamine  (VITAMIN B1) tablet 100 mg (100 mg Oral Given 05/07/23 0901)    Or  thiamine  (VITAMIN B1) injection 100 mg ( Intravenous See Alternative 05/07/23 0901)  folic acid  (FOLVITE ) tablet 1 mg (1 mg Oral Given 05/07/23 0901)  multivitamin with minerals tablet 1 tablet (1 tablet Oral Given 05/07/23 0901)  LORazepam  (ATIVAN ) tablet 1 mg (has no administration in time range)  lidocaine  (XYLOCAINE ) 2 % viscous mouth solution 15 mL (has no administration in time range)  hydrOXYzine  (ATARAX ) tablet 25 mg (has no administration in time range)  lactated ringers  bolus 1,000 mL (0 mLs Intravenous Stopped  05/07/23 0839)  hydrOXYzine  (ATARAX ) tablet 25 mg (25 mg Oral Given 05/07/23 0702)  midazolam  (VERSED ) injection 1 mg (1 mg Intravenous Given 05/07/23 0700)  potassium chloride  (KLOR-CON ) packet 40 mEq (40 mEq Oral Given 05/07/23 0733)  alum & mag hydroxide-simeth (MAALOX/MYLANTA) 200-200-20 MG/5ML suspension 30 mL (30 mLs Oral Given 05/07/23 0739)    And  lidocaine  (XYLOCAINE ) 2 % viscous mouth solution 15 mL (15 mLs Oral Given 05/07/23 0739)  pantoprazole  (PROTONIX ) injection 40 mg (40 mg Intravenous Given 05/07/23 0739)  sodium  chloride 0.9 % bolus 1,000 mL (0 mLs Intravenous Stopped 05/07/23 0954)  LORazepam  (ATIVAN ) injection 2 mg (2 mg Intravenous Given 05/07/23 0940)    ED Course/ Medical Decision Making/ A&P Clinical Course as of 05/07/23 1324  Fri May 07, 2023  0836 Patient reevaluated, sleepy from the versed , but easily awakened. Stable vitals.  Will allow time to metabolize alcohol. Reports pain in chest improved with meds [AF]  0932 Upon reevaluation, patient states he is feeling very anxious.  Also reporting return of the pain in his chest. [AF]  1238 Upon reevaluation, patient appears to have metabolized his alcohol.  Alert and oriented.  Stable vitals. [AF]    Clinical Course User Index [AF] Rexie Catena, PA-C                                 Medical Decision Making Amount and/or Complexity of Data Reviewed Labs: ordered. Radiology: ordered.  Risk OTC drugs. Prescription drug management.     Differential diagnosis includes but is not limited to alcohol use disorder, alcohol overdose, alcohol withdrawal, electrolyte abnormality, ACS, diabetes, Boerhaave syndrome  ED Course:  Upon initial evaluation, patient is curled in bed, stable vitals.  No active vomiting.  Answering questions appropriately.  No tremors, does not appear to be responding to internal stimuli.   Labs Ordered: I Ordered, and personally interpreted labs.  The pertinent results include:   Ethanol level  213 CBC with leukocytosis of 22.2 CMP with hyponatremia 131, hypokalemia 3.3, hypocalcemia 7.8.  Mildly elevated AST and ALT at 65 and 69 respectively.  No elevation in alk phos or bilirubin Magnesium  within normal limits Initial troponin of 8, repeat of 8  Imaging Studies ordered: I ordered imaging studies including chest x-ray I independently visualized the imaging with scope of interpretation limited to determining acute life threatening conditions related to emergency care. Imaging showed no acute findings I agree with the radiologist interpretation   Cardiac Monitoring: / EKG: The patient was maintained on a cardiac monitor.  I personally viewed and interpreted the cardiac monitored which showed an underlying rhythm of: Alternations between normal sinus rhythm and sinus tachycardia in the low 100's   Medications Given: Versed  and ativan  given for alcohol withdrawal and anxiety 1L LR and 1L NS for dehydration Hydroxyzine  for anxiety Maalox, Protonix , and viscous lidocaine  for pain Folic acid , thiamine , and multivitamin given for alcohol use  Patient was reevaluated multiple times as per above.  Was allowed to metabolize alcohol, currently alert oriented, stable vitals. No signs of severe withdrawal/Delirium Tremens. Feeling up to going home.  Tolerating p.o. intake.  He reports some burning pain in the chest and throat.  However, low concern for ACS at this time given troponin remains stable with initial troponin of 8 and repeat of 8, pain non-exertional, and EKG with normal sinus rhythm/sinus tachycardia and no ST changes. Chest x-ray without any acute abnormality, no concern for esophageal rupture or other emergent condition. Suspect the pain is from irritation from the alcohol consumption.  He does report improvement in pain with the Maalox and lidocaine .  Still slightly sleepy from medications given, states he will get a ride home.  Social work was consulted and they provided  resources in patient's AVS for his alcohol use disorder. Patient denies any intent for self-harm.  States he is just anxious with upcoming court date.  No indication for further psychiatric/TTS consult.    Impression: Alcohol use  disorder Hypokalemia Hyponatremia Dehydration  Disposition:  The patient was discharged home with instructions to use viscous lidocaine  as prescribed as needed for throat discomfort.  Hydroxyzine  as prescribed for anxiety.  [facility] information was provided, and resources from social work also provided for his alcohol use disorder.  Encouraged to take daily multivitamin.  Return precautions given.    Record Review: External records from outside source obtained and reviewed including multiple ER visits for alcohol use disorder     This chart was dictated using voice recognition software, Dragon. Despite the best efforts of this provider to proofread and correct errors, errors may still occur which can change documentation meaning.          Final Clinical Impression(s) / ED Diagnoses Final diagnoses:  Alcohol abuse  Hypokalemia    Rx / DC Orders ED Discharge Orders          Ordered    lidocaine  (XYLOCAINE ) 2 % solution  Every  3 hours PRN        05/07/23 1303    hydrOXYzine  (ATARAX ) 25 MG tablet  Every 6 hours PRN        05/07/23 1304              Rexie Catena, PA-C 05/07/23 1324    Rexie Catena, PA-C 05/07/23 1324    Zackowski, Scott, MD 05/07/23 1529

## 2023-05-10 ENCOUNTER — Telehealth: Payer: MEDICAID | Admitting: Nurse Practitioner

## 2023-05-10 ENCOUNTER — Other Ambulatory Visit (HOSPITAL_COMMUNITY): Payer: Self-pay

## 2023-05-10 DIAGNOSIS — K219 Gastro-esophageal reflux disease without esophagitis: Secondary | ICD-10-CM | POA: Diagnosis not present

## 2023-05-10 MED ORDER — FAMOTIDINE 20 MG PO TABS
20.0000 mg | ORAL_TABLET | Freq: Two times a day (BID) | ORAL | 0 refills | Status: DC
  Filled 2023-05-10: qty 60, 30d supply, fill #0

## 2023-05-10 MED ORDER — OMEPRAZOLE 40 MG PO CPDR
40.0000 mg | DELAYED_RELEASE_CAPSULE | Freq: Every day | ORAL | 3 refills | Status: DC
Start: 2023-05-10 — End: 2023-05-31
  Filled 2023-05-10: qty 30, 30d supply, fill #0

## 2023-05-10 NOTE — Progress Notes (Signed)
 Virtual Visit Consent   Thomas [name], you are scheduled for a virtual visit with a [facility] provider today. Just as with appointments in the office, your consent must be obtained to participate. Your consent will be active for this visit and any virtual visit you may have with one of our providers in the next 365 days. If you have a MyChart account, a copy of this consent can be sent to you electronically.  As this is a virtual visit, video technology does not allow for your provider to perform a traditional examination. This may limit your provider's ability to fully assess your condition. If your provider identifies any concerns that need to be evaluated in person or the need to arrange testing (such as labs, EKG, etc.), we will make arrangements to do so. Although advances in technology are sophisticated, we cannot ensure that it will always work on either your end or our end. If the connection with a video visit is poor, the visit may have to be switched to a telephone visit. With either a video or telephone visit, we are not always able to ensure that we have a secure connection.  By engaging in this virtual visit, you consent to the provision of healthcare and authorize for your insurance to be billed (if applicable) for the services provided during this visit. Depending on your insurance coverage, you may receive a charge related to this service.  I need to obtain your verbal consent now. Are you willing to proceed with your visit today? Marquice A Pinal has provided verbal consent on 05/10/2023 for a virtual visit (video or telephone). Mardene Shake, FNP  Date: 05/10/2023 1:35 PM   Virtual Visit via Video Note   I, Mardene Shake, connected with  Thomas [name]  ([id number], [birth year]) on 05/10/23 at  1:45 PM EDT by a video-enabled telemedicine application and verified that I am speaking with the correct person using two identifiers.  Location: Patient: Virtual Visit Location  Patient: Home Provider: Virtual Visit Location Provider: Home Office   I discussed the limitations of evaluation and management by telemedicine and the availability of in person appointments. The patient expressed understanding and agreed to proceed.    History of Present Illness: Thomas [name] is a 33 y.o. who identifies as a male who was assigned male at birth, and is being seen today for pain in his chest   Over the weekend he was started to experience some pain in the center of his chest  He called EMS and was taken to the ED 05/07/23 Had a chest XRAY at that times and labs as are copied below   Denies vomiting since onset of pain    Friday 05/07/23 he had 30 beers  For the 2 weeks prior to that he was having 24 beers a day This was in response to a stressful event. Denies daily drinking prior to that    He has not had alcohol since 05/07/23 He has been seen several times for alcohol abuse in the ED   Blood pressure today: 123/91  Currently staying with girlfriend that is present and supportive during visit    He was given Hydroxyzine  in the ER but feels that it gave him hallucinations  He has been using the lidocaine  but has already run out   Problems:  Patient Active Problem List   Diagnosis Date Noted   Alcohol withdrawal (HCC) 11/21/2021   Hypokalemia 11/21/2021   Abnormal LFTs 11/21/2021   Depression with  suicidal ideation 11/21/2021   Metabolic acidosis 11/21/2021   QT prolongation 08/13/2021   Alcoholic intoxication without complication (HCC)    Alcohol use disorder, severe, dependence (HCC) 02/03/2021   Alcohol abuse with alcohol-induced mood disorder (HCC)    Substance induced mood disorder (HCC) 01/21/2021   GAD (generalized anxiety disorder) 10/30/2019   MDD (major depressive disorder), recurrent severe, without psychosis (HCC) 10/30/2019   Insomnia due to mental condition 10/30/2019    Allergies: No Known Allergies Medications:  Current Outpatient  Medications:    chlordiazePOXIDE  (LIBRIUM ) 25 MG capsule, Please take 25 mg oral 3 times daily x3 days, then 25 mg oral twice daily x3  days, then 25 mg oral daily x3 days then stop., Disp: 18 capsule, Rfl: 0   folic acid  (FOLVITE ) 1 MG tablet, Take 1 tablet (1 mg total) by mouth daily., Disp: 30 tablet, Rfl: 0   hydrOXYzine  (ATARAX ) 25 MG tablet, Take 1 tablet (25 mg total) by mouth every 6 (six) hours as needed for anxiety., Disp: 20 tablet, Rfl: 0   lidocaine  (XYLOCAINE ) 2 % solution, Use as directed 15 mLs in the mouth or throat every 3 (three) hours as needed for mouth pain (Throat pain)., Disp: 200 mL, Rfl: 0   Multiple Vitamin (MULTIVITAMIN WITH MINERALS) TABS tablet, Take 1 tablet by mouth daily., Disp: 30 tablet, Rfl: 0   thiamine  (VITAMIN B1) 100 MG tablet, Take 1 tablet (100 mg total) by mouth daily., Disp: 30 tablet, Rfl: 0  Observations/Objective: Patient is well-developed, well-nourished in no acute distress.  Resting comfortably  at home.  Head is normocephalic, atraumatic.  No labored breathing.  Speech is clear and coherent with logical content.  Patient is alert and oriented at baseline.    Assessment and Plan:  1. Gastroesophageal reflux disease without esophagitis  Meds ordered this encounter  Medications   omeprazole (PRILOSEC) 40 MG capsule    Sig: Take 1 capsule (40 mg total) by mouth daily.    Dispense:  30 capsule    Refill:  3   famotidine (PEPCID) 20 MG tablet    Sig: Take 1 tablet (20 mg total) by mouth 2 (two) times daily.    Dispense:  60 tablet    Refill:  0     Discussed need for follow up and establishment with PCP  Will give # for alcohol program at [facility] via [facility]  Scheduled with [facility] for 05/13/23 for follow up and establishing care  Will need referrals / labs etc   Patient is agreeable with plan  Advised to continue to monitor BP and to concentrate on hydration    Follow Up Instructions: I discussed the assessment and treatment plan with the  patient. The patient was provided an opportunity to ask questions and all were answered. The patient agreed with the plan and demonstrated an understanding of the instructions.  A copy of instructions were sent to the patient via MyChart unless otherwise noted below.    The patient was advised to call back or seek an in-person evaluation if the symptoms worsen or if the condition fails to improve as anticipated.    Mardene Shake, FNP

## 2023-05-10 NOTE — Patient Instructions (Addendum)
 Alcohol program  [phone]  Follow up visit Thursday 05/13/23 @1230   [address]  [place], [place]

## 2023-05-13 ENCOUNTER — Telehealth: Payer: Self-pay | Admitting: Nurse Practitioner

## 2023-05-13 ENCOUNTER — Ambulatory Visit: Payer: MEDICAID | Admitting: Emergency Medicine

## 2023-05-13 NOTE — Telephone Encounter (Signed)
 Left voicemail for patient with instructions on how to reschedule PCP appointment

## 2023-05-20 ENCOUNTER — Ambulatory Visit: Payer: MEDICAID | Admitting: Emergency Medicine

## 2023-05-29 ENCOUNTER — Other Ambulatory Visit: Payer: Self-pay

## 2023-05-29 ENCOUNTER — Emergency Department (HOSPITAL_COMMUNITY)
Admission: EM | Admit: 2023-05-29 | Discharge: 2023-05-30 | Disposition: A | Discharge status: Home / Self Care | Payer: MEDICAID | Attending: Emergency Medicine | Admitting: Emergency Medicine

## 2023-05-29 ENCOUNTER — Encounter (HOSPITAL_COMMUNITY): Payer: Self-pay | Admitting: Emergency Medicine

## 2023-05-29 DIAGNOSIS — R45851 Suicidal ideations: Secondary | ICD-10-CM | POA: Diagnosis not present

## 2023-05-29 DIAGNOSIS — F10120 Alcohol abuse with intoxication, uncomplicated: Secondary | ICD-10-CM | POA: Insufficient documentation

## 2023-05-29 DIAGNOSIS — F10920 Alcohol use, unspecified with intoxication, uncomplicated: Secondary | ICD-10-CM

## 2023-05-29 DIAGNOSIS — F329 Major depressive disorder, single episode, unspecified: Secondary | ICD-10-CM | POA: Diagnosis present

## 2023-05-29 DIAGNOSIS — Y909 Presence of alcohol in blood, level not specified: Secondary | ICD-10-CM | POA: Diagnosis not present

## 2023-05-29 DIAGNOSIS — F332 Major depressive disorder, recurrent severe without psychotic features: Secondary | ICD-10-CM | POA: Diagnosis not present

## 2023-05-29 DIAGNOSIS — F411 Generalized anxiety disorder: Secondary | ICD-10-CM | POA: Diagnosis not present

## 2023-05-29 DIAGNOSIS — Y906 Blood alcohol level of 120-199 mg/100 ml: Secondary | ICD-10-CM | POA: Diagnosis not present

## 2023-05-29 DIAGNOSIS — F1020 Alcohol dependence, uncomplicated: Secondary | ICD-10-CM | POA: Diagnosis not present

## 2023-05-29 NOTE — ED Triage Notes (Signed)
 Pt presents to the ED via [facility] with complaints of ETOH. Pt was being evicted from a hotel when [employer] arrived and stated that he was too intoxicated to the to go to jail at this time. A&Ox4 at this time. Denies CP or SOB.

## 2023-05-30 ENCOUNTER — Encounter (HOSPITAL_COMMUNITY): Payer: Self-pay | Admitting: Emergency Medicine

## 2023-05-30 ENCOUNTER — Emergency Department (EMERGENCY_DEPARTMENT_HOSPITAL)
Admission: EM | Admit: 2023-05-30 | Discharge: 2023-05-31 | Disposition: A | Discharge status: Home / Self Care | Payer: MEDICAID | Source: Home / Self Care | Attending: Emergency Medicine | Admitting: Emergency Medicine

## 2023-05-30 ENCOUNTER — Other Ambulatory Visit: Payer: Self-pay

## 2023-05-30 DIAGNOSIS — F332 Major depressive disorder, recurrent severe without psychotic features: Secondary | ICD-10-CM | POA: Diagnosis not present

## 2023-05-30 DIAGNOSIS — F1020 Alcohol dependence, uncomplicated: Secondary | ICD-10-CM | POA: Diagnosis present

## 2023-05-30 DIAGNOSIS — F411 Generalized anxiety disorder: Secondary | ICD-10-CM | POA: Insufficient documentation

## 2023-05-30 DIAGNOSIS — Y906 Blood alcohol level of 120-199 mg/100 ml: Secondary | ICD-10-CM | POA: Insufficient documentation

## 2023-05-30 DIAGNOSIS — R45851 Suicidal ideations: Secondary | ICD-10-CM | POA: Insufficient documentation

## 2023-05-30 DIAGNOSIS — F329 Major depressive disorder, single episode, unspecified: Secondary | ICD-10-CM | POA: Insufficient documentation

## 2023-05-30 LAB — CBC WITH DIFFERENTIAL/PLATELET
Abs Immature Granulocytes: 0.17 10*3/uL — ABNORMAL HIGH (ref 0.00–0.07)
Basophils Absolute: 0.1 10*3/uL (ref 0.0–0.1)
Basophils Relative: 0 %
Eosinophils Absolute: 0 10*3/uL (ref 0.0–0.5)
Eosinophils Relative: 0 %
HCT: 53.9 % — ABNORMAL HIGH (ref 39.0–52.0)
Hemoglobin: 18.6 g/dL — ABNORMAL HIGH (ref 13.0–17.0)
Immature Granulocytes: 1 %
Lymphocytes Relative: 9 %
Lymphs Abs: 2.1 10*3/uL (ref 0.7–4.0)
MCH: 31.1 pg (ref 26.0–34.0)
MCHC: 34.5 g/dL (ref 30.0–36.0)
MCV: 90 fL (ref 80.0–100.0)
Monocytes Absolute: 1.3 10*3/uL — ABNORMAL HIGH (ref 0.1–1.0)
Monocytes Relative: 6 %
Neutro Abs: 18.9 10*3/uL — ABNORMAL HIGH (ref 1.7–7.7)
Neutrophils Relative %: 84 %
Platelets: 567 10*3/uL — ABNORMAL HIGH (ref 150–400)
RBC: 5.99 MIL/uL — ABNORMAL HIGH (ref 4.22–5.81)
RDW: 12.4 % (ref 11.5–15.5)
WBC: 22.5 10*3/uL — ABNORMAL HIGH (ref 4.0–10.5)
nRBC: 0 % (ref 0.0–0.2)

## 2023-05-30 LAB — URINALYSIS, ROUTINE W REFLEX MICROSCOPIC
Bilirubin Urine: NEGATIVE
Glucose, UA: 50 mg/dL — AB
Ketones, ur: 5 mg/dL — AB
Leukocytes,Ua: NEGATIVE
Nitrite: NEGATIVE
Protein, ur: 100 mg/dL — AB
Specific Gravity, Urine: 1.021 (ref 1.005–1.030)
pH: 6 (ref 5.0–8.0)

## 2023-05-30 LAB — ETHANOL: Alcohol, Ethyl (B): 170 mg/dL — ABNORMAL HIGH (ref <15)

## 2023-05-30 LAB — RAPID URINE DRUG SCREEN, HOSP PERFORMED
Amphetamines: NOT DETECTED
Barbiturates: NOT DETECTED
Benzodiazepines: NOT DETECTED
Cocaine: NOT DETECTED
Opiates: NOT DETECTED
Tetrahydrocannabinol: NOT DETECTED

## 2023-05-30 LAB — BASIC METABOLIC PANEL WITH GFR
Anion gap: 14 (ref 5–15)
BUN: 11 mg/dL (ref 6–20)
CO2: 21 mmol/L — ABNORMAL LOW (ref 22–32)
Calcium: 7.7 mg/dL — ABNORMAL LOW (ref 8.9–10.3)
Chloride: 101 mmol/L (ref 98–111)
Creatinine, Ser: 0.77 mg/dL (ref 0.61–1.24)
GFR, Estimated: >60 mL/min (ref >60)
Glucose, Bld: 105 mg/dL — ABNORMAL HIGH (ref 70–99)
Potassium: 3.4 mmol/L — ABNORMAL LOW (ref 3.5–5.1)
Sodium: 136 mmol/L (ref 135–145)

## 2023-05-30 LAB — COMPREHENSIVE METABOLIC PANEL WITH GFR
ALT: 48 U/L — ABNORMAL HIGH (ref 0–44)
AST: 35 U/L (ref 15–41)
Albumin: 3.9 g/dL (ref 3.5–5.0)
Alkaline Phosphatase: 136 U/L — ABNORMAL HIGH (ref 38–126)
Anion gap: 20 — ABNORMAL HIGH (ref 5–15)
BUN: 11 mg/dL (ref 6–20)
CO2: 20 mmol/L — ABNORMAL LOW (ref 22–32)
Calcium: 8.7 mg/dL — ABNORMAL LOW (ref 8.9–10.3)
Chloride: 98 mmol/L (ref 98–111)
Creatinine, Ser: 0.99 mg/dL (ref 0.61–1.24)
GFR, Estimated: >60 mL/min (ref >60)
Glucose, Bld: 121 mg/dL — ABNORMAL HIGH (ref 70–99)
Potassium: 4.1 mmol/L (ref 3.5–5.1)
Sodium: 138 mmol/L (ref 135–145)
Total Bilirubin: 1.6 mg/dL — ABNORMAL HIGH (ref 0.0–1.2)
Total Protein: 7.5 g/dL (ref 6.5–8.1)

## 2023-05-30 LAB — SALICYLATE LEVEL: Salicylate Lvl: <7 mg/dL — ABNORMAL LOW (ref 7.0–30.0)

## 2023-05-30 LAB — ACETAMINOPHEN LEVEL: Acetaminophen (Tylenol), Serum: <10 ug/mL — ABNORMAL LOW (ref 10–30)

## 2023-05-30 LAB — MAGNESIUM: Magnesium: 2 mg/dL (ref 1.7–2.4)

## 2023-05-30 MED ORDER — SODIUM CHLORIDE 0.9 % IV BOLUS
1000.0000 mL | Freq: Once | INTRAVENOUS | Status: AC
  Administered 2023-05-30: 1000 mL via INTRAVENOUS

## 2023-05-30 MED ORDER — ADULT MULTIVITAMIN W/MINERALS CH
1.0000 | ORAL_TABLET | Freq: Every day | ORAL | Status: DC
  Administered 2023-05-30 – 2023-05-31 (×2): 1 via ORAL
  Filled 2023-05-30 (×2): qty 1

## 2023-05-30 MED ORDER — CARBAMAZEPINE 200 MG PO TABS
ORAL_TABLET | ORAL | 0 refills | Status: DC
  Filled 2023-05-30: qty 11, 5d supply, fill #0

## 2023-05-30 MED ORDER — HYDROXYZINE HCL 25 MG PO TABS: 25.0000 mg | ORAL_TABLET | Freq: Three times a day (TID) | ORAL | Status: DC | PRN

## 2023-05-30 MED ORDER — THIAMINE MONONITRATE 100 MG PO TABS
100.0000 mg | ORAL_TABLET | Freq: Every day | ORAL | Status: DC
  Administered 2023-05-30 – 2023-05-31 (×2): 100 mg via ORAL
  Filled 2023-05-30 (×2): qty 1

## 2023-05-30 MED ORDER — LORAZEPAM 1 MG PO TABS
1.0000 mg | ORAL_TABLET | ORAL | Status: DC | PRN
  Administered 2023-05-30: 1 mg via ORAL
  Filled 2023-05-30: qty 1

## 2023-05-30 MED ORDER — GABAPENTIN 300 MG PO CAPS
300.0000 mg | ORAL_CAPSULE | Freq: Three times a day (TID) | ORAL | Status: DC
  Administered 2023-05-30 – 2023-05-31 (×3): 300 mg via ORAL
  Filled 2023-05-30 (×3): qty 1

## 2023-05-30 MED ORDER — ONDANSETRON 4 MG PO TBDP
4.0000 mg | ORAL_TABLET | Freq: Once | ORAL | Status: AC
  Administered 2023-05-30: 4 mg via ORAL
  Filled 2023-05-30: qty 1

## 2023-05-30 MED ORDER — FOLIC ACID 1 MG PO TABS
1.0000 mg | ORAL_TABLET | Freq: Every day | ORAL | Status: DC
  Administered 2023-05-30 – 2023-05-31 (×2): 1 mg via ORAL
  Filled 2023-05-30 (×2): qty 1

## 2023-05-30 MED ORDER — MIRTAZAPINE 7.5 MG PO TABS
15.0000 mg | ORAL_TABLET | Freq: Every day | ORAL | Status: DC
  Administered 2023-05-30: 15 mg via ORAL
  Filled 2023-05-30: qty 2

## 2023-05-30 MED ORDER — THIAMINE HCL 100 MG/ML IJ SOLN: 100.0000 mg | Freq: Every day | INTRAMUSCULAR | Status: DC

## 2023-05-30 MED ORDER — LORAZEPAM 1 MG PO TABS
1.0000 mg | ORAL_TABLET | Freq: Once | ORAL | Status: AC
  Administered 2023-05-30: 1 mg via ORAL
  Filled 2023-05-30: qty 1

## 2023-05-30 NOTE — ED Notes (Signed)
 PT belongings placed in locker 35 in the TCU

## 2023-05-30 NOTE — Consult Note (Cosign Needed Addendum)
 [facility] Psychiatric Consult Initial  Patient Name: .Thomas [name]  MRN: [id number]  DOB: Jan 04, 1991  Consult Order details:  Orders (From admission, onward)     Start     Ordered   05/30/23 1213  CONSULT TO CALL ACT TEAM       Ordering Provider: Eudora Heron, PA-C  Provider:  (Not yet assigned)  Question:  Reason for Consult?  Answer:  Psych consult   05/30/23 1212             Mode of Visit: In person    Psychiatry Consult Evaluation  Service Date: May 30, 2023 LOS:  LOS: 0 days  Chief Complaint SI  Primary Psychiatric Diagnoses  MDD 2.   GAD 3.   Alcohol use, dependence  Assessment  Thomas [name] is a 33 y.o. male admitted: Presented to the ED on 05/30/2023  6:17 AM for SI thoughts. He carries the psychiatric diagnoses of MDD, GAD, alcohol use disorder and has a past medical history of  GERD, insomnia.   His current presentation of Mood variability, impaired attention, worthlessness, decreased appetite and tiredness is most consistent with alcohol intoxication and depression. He is recommended for overnight observation with reevaluation in the morning.  Current outpatient psychotropic medications include Atarax  and historically he has had a negative response to these medications. He was non compliant with medications prior to admission as evidenced by patient report. On initial examination, patient irritable, labile, but cooperative. Please see plan below for detailed recommendations.   Diagnoses:  Active Hospital problems: Principal Problem:   MDD (major depressive disorder), recurrent severe, without psychosis (HCC) Active Problems:   GAD (generalized anxiety disorder)   Alcohol use disorder, severe, dependence (HCC)    Plan   ## Psychiatric Medication Recommendations:  Start patient on Remeron  15 mg at at bedtime for depression/appetite Started gabapentin  300 mg 3 times daily for anxiety and alcohol dependence Atarax  25 mg 3 times daily as  needed for anxiety CIWA/ Ativan  detox protocol started   ## Medical Decision Making Capacity: Not specifically addressed in this encounter  ## Further Work-up:  -- No further workup needed at this time EKG, While pt on Qtc prolonging medications, please monitor & replete K+ to 4 and Mg2+ to 2, or UDS -- most recent EKG on 05/30/23 had QtC of 450 -- Pertinent labwork reviewed earlier this admission includes: CBC, CMP, EKG, UDS   ## Disposition:--Patient is recommended for overnight observation with reevaluation in the morning.   ## Behavioral / Environmental: -To minimize splitting of staff, assign one staff person to communicate all information from the team when feasible. or Utilize compassion and acknowledge the patient's experiences while setting clear and realistic expectations for care.    ## Safety and Observation Level:  - Based on my clinical evaluation, I estimate the patient to be at low risk of self harm in the current setting. - At this time, we recommend  routine. This decision is based on my review of the chart including patient's history and current presentation, interview of the patient, mental status examination, and consideration of suicide risk including evaluating suicidal ideation, plan, intent, suicidal or self-harm behaviors, risk factors, and protective factors. This judgment is based on our ability to directly address suicide risk, implement suicide prevention strategies, and develop a safety plan while the patient is in the clinical setting. Please contact our team if there is a concern that risk level has changed.  CSSR Risk Category:C-SSRS RISK CATEGORY: High Risk  Suicide Risk Assessment: Patient has following modifiable risk factors for suicide: under treated depression , recklessness, and medication noncompliance, which we are addressing by recommending overnight observation of her evaluation in the mornings. Patient has following non-modifiable or demographic  risk factors for suicide: history of suicide attempt and psychiatric hospitalization Patient has the following protective factors against suicide: Supportive family  Thank you for this consult request. Recommendations have been communicated to the primary team.  We will recommend overnight observation reevaluation in the morning and we will continue to follow patient at this time.   Chandra Come, PMHNP       History of Present Illness  Relevant Aspects of Hospital ED Course:  Admitted on 05/30/2023 for SI  Patient Report:  Thomas [name], 33 y.o., male patient seen face to face by this provider, consulted with Dr. Deborah Falling; and chart reviewed on 05/30/23.  On evaluation Thomas [name] reports that he drank  24-32 beers between yesterday and today.  Patient reported drinking to treat anxiety and panic attack.  Patient states that he cannot stop drinking because that is the only way and treats anxiety.  Patient states that he was then living with his then girlfriend for about 5 days, she told him that he had to leave and he started living in hotels for about 3 days, she invited him back to stay with her for a couple days and then her mother came in to town and told him he had to get out of the girlfriend's apartment.  She states since then he has been living in and out of hotels, he is currently now living in a boardinghouse but is very anxious because he states the owner of the house of the house does not know where he is and he does not want him to throw his things away.  As patient is talking to this provider he appears very shaky, picking at his fingers, and some are bleeding.  He also states that he is very anxious because he has a call at 6 PM to make to the [facility], trying to get into there for alcohol, but also endorses suicidal ideations.  Patient states he is having some intermittent suicidal thoughts for the past 3 weeks, adamant he does not have a plan, states he would  really go to the [employer] house then to an inpatient detox facility.  Patient states his anxiety is leading to his alcoholism.  Upon admission patient BAL is 170, UDS is needed patient denies using any other illicit substances. Patient reported poor appetite and sleep stating that Alcohol fills his stomach and that he drinks to help him sleep.  Patient does not have outpatient Psychiatrist and also stated that he does not take his medications as prescribed.     Past Psychiatric History: MDD, alcohol use, anxiety, insomnia, and generalized anxiety disorder.  Patient was hospitalized in [facility] in March of this year.  He was also hospitalized November 2021 at [facility]. Patient has had multiple ER Visits.as well.  Psych ROS:  Depression: Endorses Anxiety: Endorses Mania (lifetime and current): Denies Psychosis: (lifetime and current): Denies  Collateral information:   While this provider is speaking with patient, patient mother Jameel Quant comes for visit, patient gives permission for this provider to speak with her, but states "she cannot make any decisions for me."  She states that her son has been an issue with alcohol, feels that alcohol will take away all of his pain and mental illness.  States  he has had outpatient and inpatient services with [facility], but refused to follow up with any appointments.  She states she cannot live with her and his sister due to the tension and drama that patient brings.  She states patient has been diagnosed with bipolar, OCD, and panic attacks and has been on several medication trials but none seem to help him at this moment.  He feels the only thing that works for him is benzodiazepines and alcohol.   Review of Systems  Psychiatric/Behavioral:  Positive for depression and substance abuse.      Psychiatric and Social History  Psychiatric History:  Information collected from patient and patient's mother  Prev Dx/Sx: Bipolar, depression, anxiety, alcohol  dependence Current Psych Provider: None Home Meds (current): Atarax  Previous Med Trials: Yes Therapy: None  Prior Psych Hospitalization: Yes Prior Self Harm: Yes Prior Violence: Yes  Family Psych History: Denies Family Hx suicide: Denies  Social History:  Developmental Hx: Deferred Educational Hx: Patient graduated high school Occupational Hx: Unemployed Legal Hx: Yes, patient is on probation Living Situation: Lives alone in a boarding home Spiritual Hx: Yes Access to weapons/lethal means: Denies  Substance History Alcohol: Yes Type of alcohol Beer, liquor Last Drink today Number of drinks per day 20-30 beers a day History of alcohol withdrawal seizures yes History of DT's yes Tobacco: Yes Illicit drugs: Yes Prescription drug abuse: Denies Rehab hx: Denies  Exam Findings  Physical Exam:  Vital Signs:  Temp:  [97.5 F (36.4 C)-98.4 F (36.9 C)] 98.2 F (36.8 C) (05/25 1100) Pulse Rate:  [89-140] 115 (05/25 1100) Resp:  [16-18] 16 (05/25 1100) BP: (112-137)/(68-105) 122/68 (05/25 1100) SpO2:  [94 %-100 %] 96 % (05/25 1100) Weight:  [76.2 kg] 76.2 kg (05/25 0615) Blood pressure 122/68, pulse (!) 115, temperature 98.2 F (36.8 C), temperature source Oral, resp. rate 16, height 6' (1.829 m), weight 76.2 kg, SpO2 96%. Body mass index is 22.78 kg/m.  Physical Exam Vitals and nursing note reviewed. Exam conducted with a chaperone present.  Neurological:     Mental Status: He is alert.  Psychiatric:        Attention and Perception: Attention normal.        Mood and Affect: Mood is anxious. Affect is labile and flat.        Speech: Speech normal.        Behavior: Behavior is agitated.        Thought Content: Thought content includes suicidal ideation.        Cognition and Memory: Cognition is impaired.        Judgment: Judgment is inappropriate.     Mental Status Exam: General Appearance: Disheveled  Orientation:  Full (Time, Place, and Person)  Memory:   Immediate;   Fair Remote;   Fair  Concentration:  Concentration: Fair and Attention Span: Fair  Recall:  Fair  Attention  Fair  Eye Contact:  Fair  Speech:  Clear and Coherent  Language:  Fair  Volume:  Decreased  Mood: irritable  Affect:  Appropriate and Blunt  Thought Process:  Coherent  Thought Content:  WDL  Suicidal Thoughts:  Yes.  without intent/plan  Homicidal Thoughts:  No  Judgement:  Impaired  Insight:  Lacking  Psychomotor Activity:  Normal  Akathisia:  NA  Fund of Knowledge:  Fair      Assets:  Manufacturing systems engineer Desire for Improvement Social Support  Cognition:  Impaired,  Mild  ADL's:  Impaired  AIMS (if indicated):  Other History   These have been pulled in through the EMR, reviewed, and updated if appropriate.  Family History:  The patient's family history is not on file.  Medical History: Past Medical History:  Diagnosis Date   Anxiety     Surgical History: Past Surgical History:  Procedure Laterality Date   APPENDECTOMY       Medications:   Current Facility-Administered Medications:    folic acid  (FOLVITE ) tablet 1 mg, 1 mg, Oral, Daily, Barrett, Jamie N, PA-C, 1 mg at 05/30/23 1191   gabapentin  (NEURONTIN ) capsule 300 mg, 300 mg, Oral, TID, Motley-Mangrum, Evolette Pendell A, PMHNP   hydrOXYzine  (ATARAX ) tablet 25 mg, 25 mg, Oral, TID PRN, Motley-Mangrum, Cortina Vultaggio A, PMHNP   LORazepam  (ATIVAN ) tablet 1-4 mg, 1-4 mg, Oral, Q1H PRN, Barrett, Jamie N, PA-C   mirtazapine  (REMERON ) tablet 15 mg, 15 mg, Oral, QHS, Motley-Mangrum, Jamonta Goerner A, PMHNP   multivitamin with minerals tablet 1 tablet, 1 tablet, Oral, Daily, Barrett, Jamie N, PA-C, 1 tablet at 05/30/23 4782   thiamine  (VITAMIN B1) tablet 100 mg, 100 mg, Oral, Daily, 100 mg at 05/30/23 0833 **OR** thiamine  (VITAMIN B1) injection 100 mg, 100 mg, Intravenous, Daily, Barrett, Jamie N, PA-C  Current Outpatient Medications:    carbamazepine (TEGRETOL) 200 MG tablet, Take 4 tablets (800mg ) po daily  for 1 day, then 3 tablets (600mg ) for 1 day, then 2 tablets (400mg ) for 1 day, then 1 tablet (200mg ) for 2 days, Disp: 11 tablet, Rfl: 0   chlordiazePOXIDE  (LIBRIUM ) 25 MG capsule, Please take 25 mg oral 3 times daily x3 days, then 25 mg oral twice daily x3  days, then 25 mg oral daily x3 days then stop., Disp: 18 capsule, Rfl: 0   famotidine  (PEPCID ) 20 MG tablet, Take 1 tablet (20 mg total) by mouth 2 (two) times daily., Disp: 60 tablet, Rfl: 0   folic acid  (FOLVITE ) 1 MG tablet, Take 1 tablet (1 mg total) by mouth daily., Disp: 30 tablet, Rfl: 0   hydrOXYzine  (ATARAX ) 25 MG tablet, Take 1 tablet (25 mg total) by mouth every 6 (six) hours as needed for anxiety., Disp: 20 tablet, Rfl: 0   lidocaine  (XYLOCAINE ) 2 % solution, Use as directed 15 mLs in the mouth or throat every 3 (three) hours as needed for mouth pain (Throat pain)., Disp: 200 mL, Rfl: 0   Multiple Vitamin (MULTIVITAMIN WITH MINERALS) TABS tablet, Take 1 tablet by mouth daily., Disp: 30 tablet, Rfl: 0   omeprazole  (PRILOSEC) 40 MG capsule, Take 1 capsule (40 mg total) by mouth daily., Disp: 30 capsule, Rfl: 3   thiamine  (VITAMIN B1) 100 MG tablet, Take 1 tablet (100 mg total) by mouth daily., Disp: 30 tablet, Rfl: 0  Allergies: No Known Allergies  Napolean Sia MOTLEY-MANGRUM, PMHNP

## 2023-05-30 NOTE — ED Triage Notes (Signed)
 Pt presents to the ED via POV with complaints of SI that started 3 weeks ago. Pt notes that he was seen here earlier for ETOH intoxication but did not feel this way until he "had no place to go". Pt states that he "has racing thoughts of wanting to kill myself but I have no real plan". A&Ox4 at this time. Denies CP or SOB.

## 2023-05-30 NOTE — ED Notes (Addendum)
 Pt had a phone interview with [facility] and was accepted to come tomorrow.  They said they have a bed for him.  After pt is hopefully discharged from here tomorrow, his mother will pick him up here and get his things together and get him to the bank and get him to the [facility].  One of the interviewers was named Merchant navy officer.   Layman stated it was a 5 person interview.  I did overhear some of the conversation and he did very good on his side talking with them and explaining what his needs are.

## 2023-05-30 NOTE — ED Notes (Signed)
 PT is awake, alert and able to answer questions appropriately. PT also able to stand and ambulate without assistance.

## 2023-05-30 NOTE — Progress Notes (Signed)
 TOC consulted for substance abuse. Resources attached to AVS. [facility] [place] provided shelter resources. No further TOC needs.

## 2023-05-30 NOTE — ED Notes (Signed)
 Pt has been dressed out and belongings given the the nurses in 1-8 (two bags, labeled). Security has been called to wand the patient.  Pt provided urine sample (sent to the lab)

## 2023-05-30 NOTE — ED Notes (Signed)
 Patient stated the medication he received earlier helped a lot and felt more at ease. Patient denies any withdrawal symptoms at this time. Patient stated he is just sleepy from the medication.

## 2023-05-30 NOTE — ED Notes (Signed)
 Provided pt with lunch tray pt ate 0%

## 2023-05-30 NOTE — ED Notes (Signed)
 PT sleeping, but arousable to voice. PT offered something to eat and drink, but declines at this time. PT is malodorous, disheveled, unkept and smells of alcohol. VSS and dispo pending

## 2023-05-30 NOTE — ED Provider Notes (Signed)
 [place] EMERGENCY DEPARTMENT AT [facility] Provider Note   CSN: [id number] Arrival date & time: 05/30/23  2130     History  Chief Complaint  Patient presents with   Suicidal    Thomas [name] is a 33 y.o. male.  Patient with past medical history of substance use disorder, alcohol abuse with severe dependence and withdrawal presenting to emergency room with complaint of suicidal ideation and panic.  Patient reports when he was seen here earlier today he left feeling very panicked and so then he drank a large bottle of vodka and now he is back, says his last drink was just prior to arrival. Dose not have a plan. Denies HI, hallucinations. Denies pain, denies HA/falls. Denies drug use. Denies taking medications.   HPI     Home Medications Prior to Admission medications   Medication Sig Start Date End Date Taking? Authorizing Provider  carbamazepine (TEGRETOL) 200 MG tablet 800mg  PO QD X 1D, then 600mg  PO QD X 1D, then 400mg  QD X 1D, then 200mg  PO QD X 2D 05/30/23   Palumbo, April, MD  chlordiazePOXIDE  (LIBRIUM ) 25 MG capsule Please take 25 mg oral 3 times daily x3 days, then 25 mg oral twice daily x3  days, then 25 mg oral daily x3 days then stop. 11/21/21   Elgergawy, Ardia Kraft, MD  famotidine  (PEPCID ) 20 MG tablet Take 1 tablet (20 mg total) by mouth 2 (two) times daily. 05/10/23 06/09/23  Mardene Shake, FNP  folic acid  (FOLVITE ) 1 MG tablet Take 1 tablet (1 mg total) by mouth daily. 11/22/21   Elgergawy, Ardia Kraft, MD  hydrOXYzine  (ATARAX ) 25 MG tablet Take 1 tablet (25 mg total) by mouth every 6 (six) hours as needed for anxiety. 05/07/23   Rexie Catena, PA-C  lidocaine  (XYLOCAINE ) 2 % solution Use as directed 15 mLs in the mouth or throat every 3 (three) hours as needed for mouth pain (Throat pain). 05/07/23   Rexie Catena, PA-C  Multiple Vitamin (MULTIVITAMIN WITH MINERALS) TABS tablet Take 1 tablet by mouth daily. 11/22/21   Elgergawy, Ardia Kraft, MD  omeprazole   (PRILOSEC) 40 MG capsule Take 1 capsule (40 mg total) by mouth daily. 05/10/23   Mardene Shake, FNP  thiamine  (VITAMIN B1) 100 MG tablet Take 1 tablet (100 mg total) by mouth daily. 11/22/21   Elgergawy, Ardia Kraft, MD      Allergies    Patient has no known allergies.    Review of Systems   Review of Systems  Psychiatric/Behavioral:  Positive for agitation and suicidal ideas.     Physical Exam Updated Vital Signs BP (!) 137/105 (BP Location: Left Arm)   Pulse (!) 140   Temp (!) 97.5 F (36.4 C) (Oral)   Resp 18   Ht 6' (1.829 m)   Wt 76.2 kg   SpO2 97%   BMI 22.78 kg/m  Physical Exam Vitals and nursing note reviewed.  Constitutional:      General: He is not in acute distress.    Appearance: He is not toxic-appearing.  HENT:     Head: Normocephalic and atraumatic.  Eyes:     General: No scleral icterus.    Conjunctiva/sclera: Conjunctivae normal.  Cardiovascular:     Rate and Rhythm: Regular rhythm. Tachycardia present.     Pulses: Normal pulses.     Heart sounds: Normal heart sounds.  Pulmonary:     Effort: Pulmonary effort is normal. No respiratory distress.     Breath sounds: Normal breath  sounds.  Abdominal:     General: Abdomen is flat. Bowel sounds are normal.     Palpations: Abdomen is soft.     Tenderness: There is no abdominal tenderness.  Skin:    General: Skin is warm and dry.     Findings: No lesion.  Neurological:     General: No focal deficit present.     Mental Status: He is alert and oriented to person, place, and time. Mental status is at baseline.     Comments: Alert and oriented no slurred speech.  Does not appear drowsy.  Moving extremities without difficulty.     ED Results / Procedures / Treatments   Labs (all labs ordered are listed, but only abnormal results are displayed) Labs Reviewed  COMPREHENSIVE METABOLIC PANEL WITH GFR - Abnormal; Notable for the following components:      Result Value   CO2 20 (*)    Glucose, Bld 121 (*)     Calcium 8.7 (*)    ALT 48 (*)    Alkaline Phosphatase 136 (*)    Total Bilirubin 1.6 (*)    Anion gap 20 (*)    All other components within normal limits  ETHANOL - Abnormal; Notable for the following components:   Alcohol, Ethyl (B) 170 (*)    All other components within normal limits  CBC WITH DIFFERENTIAL/PLATELET - Abnormal; Notable for the following components:   WBC 22.5 (*)    RBC 5.99 (*)    Hemoglobin 18.6 (*)    HCT 53.9 (*)    Platelets 567 (*)    Neutro Abs 18.9 (*)    Monocytes Absolute 1.3 (*)    Abs Immature Granulocytes 0.17 (*)    All other components within normal limits  ACETAMINOPHEN  LEVEL - Abnormal; Notable for the following components:   Acetaminophen  (Tylenol ), Serum <10 (*)    All other components within normal limits  SALICYLATE LEVEL - Abnormal; Notable for the following components:   Salicylate Lvl <7.0 (*)    All other components within normal limits  URINALYSIS, ROUTINE W REFLEX MICROSCOPIC - Abnormal; Notable for the following components:   APPearance TURBID (*)    Glucose, UA 50 (*)    Hgb urine dipstick SMALL (*)    Ketones, ur 5 (*)    Protein, ur 100 (*)    Bacteria, UA RARE (*)    All other components within normal limits  BASIC METABOLIC PANEL WITH GFR - Abnormal; Notable for the following components:   Potassium 3.4 (*)    CO2 21 (*)    Glucose, Bld 105 (*)    Calcium 7.7 (*)    All other components within normal limits  RAPID URINE DRUG SCREEN, HOSP PERFORMED  MAGNESIUM     EKG EKG Interpretation Date/Time:  Sunday May 30 2023 07:37:36 EDT Ventricular Rate:  125 PR Interval:  152 QRS Duration:  66 QT Interval:  312 QTC Calculation: 450 R Axis:   76  Text Interpretation: Sinus tachycardia Nonspecific ST abnormality Confirmed by Iva Mariner ([id number]) on 05/30/2023 8:29:50 AM  Radiology No results found.  Procedures Procedures    Medications Ordered in ED Medications - No data to display  ED Course/ Medical Decision  Making/ A&P Clinical Course as of 05/30/23 1222  Sun May 30, 2023  1212 Anion gap: 14 Improved anion gap after 2L NS [JB]    Clinical Course User Index [JB] Dione Petron, Kandace Organ, PA-C  Medical Decision Making Amount and/or Complexity of Data Reviewed Labs: ordered. Decision-making details documented in ED Course.  Risk OTC drugs. Prescription drug management.   This patient presents to the ED for concern of SI, this involves an extensive number of treatment options, and is a complaint that carries with it a high risk of complications and morbidity.  The differential diagnosis includes SI, HI, hallucination, withdrawal   Co morbidities that complicate the patient evaluation  EtOH with withdrawal    Additional history obtained:  Additional history obtained from 05/30/23 ED visit for EtOH    Lab Tests:  I personally interpreted labs.  The pertinent results include:   CBC with leukocytosis prior to similar labs, feel most likely reactive.  CMP anion gap of 20, will give 2 L normal saline and reassess with BMP -- BMP improved anion gap after fluids EtOH WNL ASA, Acet level negative  UDS negative    Cardiac Monitoring: / EKG:  The patient was maintained on a cardiac monitor.  I personally viewed and interpreted the cardiac monitored which showed an underlying rhythm of: sinus tachycardia    Consultations Obtained:  I requested consultation with the BH,  and discussed lab and imaging findings as well as pertinent plan - they recommend: pending    Problem List / ED Course / Critical interventions / Medication management  Patient reporting to emergency room with complaint of anxiety and suicidal ideation. Denies fall. Normal mentation on arrival. No headache or neck pain.  Reports he has been dealing with SI for the past 2 weeks but that has been much worse since he left the ER earlier today.  On arrival he is stable and well-appearing but quite  tachycardic at 140.  EKG shows sinus tachycardia.  He has mild tremor.  He notes he is very anxious.  Will place on CIWA precaution with history of drinking give 1 mg of Ativan  and reassess.  Patient reports that his last drink was just prior to coming into ER.  EtOH level is currently pending.  He denies pain and is moving extremities without difficulty.  Lungs are clear to auscultation bilaterally.  Will obtain medical clearance labs placed on CIWA precaution, suicidal precaution and have him speak to behavioral health due to SI.  I ordered medication including Ativan  for anxiety. CIWA placed. Reevaluation of the patient after these medicines showed that the patient improved I have reviewed the patients home medicines and have made adjustments as needed   Plan  ED psych hold.  Medically clear.  Anxiety and vital signs have improved after 2 L saline and Ativan .  Placed on CIWA precautions and normal diet ordered.         Final Clinical Impression(s) / ED Diagnoses Final diagnoses:  Suicidal ideation    Rx / DC Orders ED Discharge Orders     None         Adina Ahle 05/30/23 1225    Iva Mariner, MD 05/30/23 [id number]

## 2023-05-30 NOTE — ED Provider Notes (Signed)
  EMERGENCY DEPARTMENT AT [facility] Provider Note   CSN: [id number] Arrival date & time: 05/29/23  2333     History  Chief Complaint  Patient presents with   Alcohol Intoxication    Thomas [name] is a 33 y.o. male.  The history is provided by the patient.  Alcohol Intoxication This is a recurrent problem. The current episode started 12 to 24 hours ago. The problem occurs constantly. The problem has not changed since onset.Pertinent negatives include no chest pain, no abdominal pain, no headaches and no shortness of breath. Nothing aggravates the symptoms. Nothing relieves the symptoms. He has tried nothing for the symptoms. The treatment provided no relief.  Patient was thrown out of hotel for being intoxicated and there fore was brought to the ED.       Home Medications Prior to Admission medications   Medication Sig Start Date End Date Taking? Authorizing Provider  carbamazepine (TEGRETOL) 200 MG tablet 800mg  PO QD X 1D, then 600mg  PO QD X 1D, then 400mg  QD X 1D, then 200mg  PO QD X 2D 05/30/23  Yes Joannah Gitlin, MD  chlordiazePOXIDE  (LIBRIUM ) 25 MG capsule Please take 25 mg oral 3 times daily x3 days, then 25 mg oral twice daily x3  days, then 25 mg oral daily x3 days then stop. 11/21/21   Elgergawy, Ardia Kraft, MD  famotidine  (PEPCID ) 20 MG tablet Take 1 tablet (20 mg total) by mouth 2 (two) times daily. 05/10/23 06/09/23  Mardene Shake, FNP  folic acid  (FOLVITE ) 1 MG tablet Take 1 tablet (1 mg total) by mouth daily. 11/22/21   Elgergawy, Ardia Kraft, MD  hydrOXYzine  (ATARAX ) 25 MG tablet Take 1 tablet (25 mg total) by mouth every 6 (six) hours as needed for anxiety. 05/07/23   Rexie Catena, PA-C  lidocaine  (XYLOCAINE ) 2 % solution Use as directed 15 mLs in the mouth or throat every 3 (three) hours as needed for mouth pain (Throat pain). 05/07/23   Rexie Catena, PA-C  Multiple Vitamin (MULTIVITAMIN WITH MINERALS) TABS tablet Take 1 tablet by mouth daily.  11/22/21   Elgergawy, Ardia Kraft, MD  omeprazole  (PRILOSEC) 40 MG capsule Take 1 capsule (40 mg total) by mouth daily. 05/10/23   Mardene Shake, FNP  thiamine  (VITAMIN B1) 100 MG tablet Take 1 tablet (100 mg total) by mouth daily. 11/22/21   Elgergawy, Ardia Kraft, MD      Allergies    Patient has no known allergies.    Review of Systems   Review of Systems  Respiratory:  Negative for shortness of breath.   Cardiovascular:  Negative for chest pain.  Gastrointestinal:  Negative for abdominal pain.  Neurological:  Negative for headaches.  Psychiatric/Behavioral:  Negative for self-injury and suicidal ideas. The patient is not nervous/anxious.   All other systems reviewed and are negative.   Physical Exam Updated Vital Signs BP 113/74 (BP Location: Left Arm)   Pulse (!) 114   Temp 98 F (36.7 C) (Oral)   Resp 16   Ht 6' (1.829 m)   Wt 76.2 kg   SpO2 94%   BMI 22.78 kg/m  Physical Exam Vitals and nursing note reviewed.  Constitutional:      General: He is not in acute distress.    Appearance: Normal appearance. He is well-developed. He is not diaphoretic.  HENT:     Head: Normocephalic and atraumatic.     Nose: Nose normal.  Eyes:     Conjunctiva/sclera: Conjunctivae normal.  Pupils: Pupils are equal, round, and reactive to light.  Cardiovascular:     Rate and Rhythm: Normal rate and regular rhythm.     Pulses: Normal pulses.     Heart sounds: Normal heart sounds.  Pulmonary:     Effort: Pulmonary effort is normal.     Breath sounds: Normal breath sounds. No wheezing or rales.  Abdominal:     General: Bowel sounds are normal.     Palpations: Abdomen is soft.     Tenderness: There is no abdominal tenderness. There is no guarding or rebound.  Musculoskeletal:        General: Normal range of motion.     Cervical back: Normal range of motion and neck supple.  Skin:    General: Skin is warm and dry.     Capillary Refill: Capillary refill takes less than 2 seconds.   Neurological:     General: No focal deficit present.     Mental Status: He is alert and oriented to person, place, and time.     Deep Tendon Reflexes: Reflexes normal.  Psychiatric:        Mood and Affect: Mood normal.        Behavior: Behavior normal.     ED Results / Procedures / Treatments   Labs (all labs ordered are listed, but only abnormal results are displayed) Labs Reviewed - No data to display  EKG None  Radiology No results found.  Procedures Procedures    Medications Ordered in ED Medications - No data to display  ED Course/ Medical Decision Making/ A&P                                 Medical Decision Making Patient is intoxicated and has been thrown out of hotel for same   Amount and/or Complexity of Data Reviewed Independent Historian: EMS    Details: See above  External Data Reviewed: notes.    Details: Previous notes reviewed   Risk Prescription drug management. Risk Details: POI challenged and ambulated about the department.  Patient was not trying to hurt himself.  He was not SI or HI at the time of exam.  Stable for discharge.      Final Clinical Impression(s) / ED Diagnoses Final diagnoses:  Alcoholic intoxication without complication (HCC)    No signs of systemic illness or infection. The patient is nontoxic-appearing on exam and vital signs are within normal limits.  I have reviewed the triage vital signs and the nursing notes. Pertinent labs & imaging results that were available during my care of the patient were reviewed by me and considered in my medical decision making (see chart for details). After history, exam, and medical workup I feel the patient has been appropriately medically screened and is safe for discharge home. Pertinent diagnoses were discussed with the patient. Patient was given return precautions.      Rx / DC Orders ED Discharge Orders          Ordered    carbamazepine (TEGRETOL) 200 MG tablet        05/30/23  0203              Keylani Perlstein, MD 05/30/23 1610

## 2023-05-30 NOTE — Progress Notes (Signed)
 [facility] LCSW Progress Note   05/30/2023    4:55 PM  Thomas [name]   [id number]   Type of Contact and Topic:  Psychiatric Bed Placement   Pt accepted to [facility]    Patient meets inpatient criteria per Chandra Come, PMHNP    The attending provider will be Dr. Kathlen Para   Call report to [phone]  [name], Paramedic @ [facility] ED notified.     Pt scheduled  to arrive at [facility] TODAY.    [name], MSW, LCSW-A  4:56 PM 05/30/2023

## 2023-05-31 DIAGNOSIS — F332 Major depressive disorder, recurrent severe without psychotic features: Secondary | ICD-10-CM

## 2023-05-31 MED ORDER — GABAPENTIN 300 MG PO CAPS: 300.0000 mg | ORAL_CAPSULE | Freq: Three times a day (TID) | ORAL | 0 refills | Status: AC

## 2023-05-31 MED ORDER — FOLIC ACID 1 MG PO TABS: 1.0000 mg | ORAL_TABLET | Freq: Every day | ORAL | 0 refills | Status: AC

## 2023-05-31 MED ORDER — ADULT MULTIVITAMIN W/MINERALS CH: 1.0000 | ORAL_TABLET | Freq: Every day | ORAL | 0 refills | Status: AC

## 2023-05-31 MED ORDER — VITAMIN B-1 100 MG PO TABS
100.0000 mg | ORAL_TABLET | Freq: Every day | ORAL | 0 refills | Status: AC
Start: 2023-05-31 — End: 2023-06-03

## 2023-05-31 MED ORDER — MIRTAZAPINE 15 MG PO TABS: 15.0000 mg | ORAL_TABLET | Freq: Every day | ORAL | 0 refills | Status: AC

## 2023-05-31 MED ORDER — HYDROXYZINE HCL 25 MG PO TABS: 25.0000 mg | ORAL_TABLET | Freq: Three times a day (TID) | ORAL | 0 refills | Status: AC | PRN

## 2023-05-31 NOTE — Consult Note (Signed)
 [facility] Follow-[facility]  Patient Name: .Thomas [name]  MRN: [id number]  DOB: 1990/07/05  Consult Order details:  Orders (From admission, onward)     Start     Ordered   05/30/23 1213  CONSULT TO CALL ACT TEAM       Ordering Provider: Eudora Heron, PA-C  Provider:  (Not yet assigned)  Question:  Reason for Consult?  Answer:  Psych consult   05/30/23 1212             Mode of Visit: In person    Psychiatry Consult Evaluation  Service Date: May 31, 2023 LOS:  LOS: 0 days  Chief Complaint SI  Primary Psychiatric Diagnoses  MDD 2.   GAD 3.   Alcohol use, dependence  Assessment  Thomas [name] is a 33 y.o. male admitted: Presented to the ED on 05/30/2023  6:17 AM for SI thoughts. He carries the psychiatric diagnoses of MDD, GAD, alcohol use disorder and has a past medical history of  GERD, insomnia.   His current presentation of Mood variability, impaired attention, worthlessness, decreased appetite and tiredness is most consistent with alcohol intoxication and depression. He is recommended for overnight observation with reevaluation in the morning.  Current outpatient psychotropic medications include Atarax  and historically he has had a negative response to these medications. He was non compliant with medications prior to admission as evidenced by patient report. On initial examination, patient irritable, labile, but cooperative. Please see plan below for detailed recommendations.   Diagnoses:  Active Hospital problems: Principal Problem:   MDD (major depressive disorder), recurrent severe, without psychosis (HCC) Active Problems:   GAD (generalized anxiety disorder)   Alcohol use disorder, severe, dependence (HCC)    Plan   ## Psychiatric Medication Recommendations:  Continue patient on Remeron  15 mg at at bedtime for depression/appetite Continue gabapentin  300 mg 3 times daily for anxiety and alcohol dependence Atarax  25 mg 3 times daily  as needed for anxiety CIWA/ Ativan  detox protocol started   ## Medical Decision Making Capacity: Not specifically addressed in this encounter  ## Further Work-up:  -- No further workup needed at this time EKG, While pt on Qtc prolonging medications, please monitor & replete K+ to 4 and Mg2+ to 2, or UDS -- most recent EKG on 05/30/23 had QtC of 450 -- Pertinent labwork reviewed earlier this admission includes: CBC, CMP, EKG, UDS   ## Disposition:--Patient is Psychiatrically cleared to go to [facility] this morning.  He is advised to seek treatment for Depression and anxiety at [facility] health Facility. ## Behavioral / Environmental: -To minimize splitting of staff, assign one staff person to communicate all information from the team when feasible. or Utilize compassion and acknowledge the patient's experiences while setting clear and realistic expectations for care.    ## Safety and Observation Level:  - Based on my clinical evaluation, I estimate the patient to be at low risk of self harm in the current setting. - At this time, we recommend  routine. This decision is based on my review of the chart including patient's history and current presentation, interview of the patient, mental status examination, and consideration of suicide risk including evaluating suicidal ideation, plan, intent, suicidal or self-harm behaviors, risk factors, and protective factors. This judgment is based on our ability to directly address suicide risk, implement suicide prevention strategies, and develop a safety plan while the patient is in the clinical setting. Please contact our team if there is  a concern that risk level has changed.  CSSR Risk Category:C-SSRS RISK CATEGORY: High Risk  Suicide Risk Assessment: Patient has following modifiable risk factors for suicide: under treated depression , recklessness, and medication noncompliance, which we are addressing by recommending overnight  observation of her evaluation in the mornings. Patient has following non-modifiable or demographic risk factors for suicide: history of suicide attempt and psychiatric hospitalization Patient has the following protective factors against suicide: Supportive family  Thank you for this consult request. Recommendations have been communicated to the primary team.  We will Psychiatrically clear this patient at this time.   Thomas Marsland C Izabellah Dadisman, NP-  PMHNP-BC       History of Present Illness  Relevant Aspects of Hospital ED Course:  Admitted on 05/30/2023 for SI  Patient:  Thomas [name], 33 y.o., male patient seen face to face by this provider this morning for evaluation in preparation fr discharge.  Patient has Psychiatry diagnosis  of  MDD, GAD, alcohol use disorder and has a past medical history of  GERD, insomnia.  He originally came in for Alcohol intoxication and was safely detox and discharged .  He came back a day after discharge with plan to spent the night for evaluation. This morning patient reports he slept much better than he did in the past week.  He also denies feeling depressed but feels tired and anxious.  Patient is anxious because he going to [facility] for sober housing.  No Alcohol or drug use and patient states he is willing to go but is worried if he will be able to complete the program.  Patient described his mood as "ok" and engaged in meaningful conversation.  He is currently not taking any medication for Mental illness but sees a Psychiatry provider at [facility].  Patient plans to resume treatment at the  facility.  Patient is awake, alert and oriented x4.  He is ambulatory and is taking a shower now before discharge.  Mother is coming to transport him to [facility].  Patient want to go to [facility] but he presents anxious affect.  Provider encouraged patient to remain positive and stay in the Program.  Provider reminded patient that he can visit [place] [facility] to resume Mental health care.  Patient is in agreement and he denies SI/HI/AVH.   Patient is Psychiatrically cleared to go to [facility] sober house.  Fourteen days wort of Medications given to patient to use until his appointment to see Mental health Provider at [facility] health Provider.  We reviewed safety plan-call 911 or [phone] for mental health crisis including but not limited to suicidal thought or suicide ideation.  Follow up with your Mental health Provider at [facility] Mental health provider.   Past Psychiatric History: MDD, alcohol use, anxiety, insomnia, and generalized anxiety disorder.  Patient was hospitalized in [facility] in March of this year.  He was also hospitalized November 2021 at [facility]. Patient has had multiple ER Visits.as well.  Psych ROS:  Depression: Endorses Anxiety: Endorses Mania (lifetime and current): Denies Psychosis: (lifetime and current): Denies  Collateral information: NA-Done already  Review of Systems  Constitutional: Negative.   HENT: Negative.    Eyes: Negative.   Respiratory: Negative.    Cardiovascular: Negative.   Gastrointestinal: Negative.   Genitourinary: Negative.   Musculoskeletal: Negative.   Skin: Negative.   Endo/Heme/Allergies: Negative.   Psychiatric/Behavioral:  Positive for substance abuse.      Psychiatric and Social History  Psychiatric  History:  Information collected from patient and patient's mother  Prev Dx/Sx: Bipolar, depression, anxiety, alcohol dependence Current Psych Provider: None Home Meds (current): Atarax  Previous Med Trials: Yes Therapy: None  Prior Psych Hospitalization: Yes Prior Self Harm: Yes Prior Violence: Yes  Family Psych History: Denies Family Hx suicide: Denies  Social History:  Developmental Hx: Deferred Educational Hx: Patient graduated high school Occupational Hx: Unemployed Legal Hx: Yes, patient is on probation Living Situation: Lives alone in a boarding home Spiritual  Hx: Yes Access to weapons/lethal means: Denies  Substance History Alcohol: Yes Type of alcohol Beer, liquor Last Drink today Number of drinks per day 20-30 beers a day History of alcohol withdrawal seizures yes History of DT's yes Tobacco: Yes Illicit drugs: Yes Prescription drug abuse: Denies Rehab hx: Denies  Exam Findings  Physical Exam:  Vital Signs:  Temp:  [97.8 F (36.6 C)-98.2 F (36.8 C)] 97.8 F (36.6 C) (05/26 0330) Pulse Rate:  [100-115] 101 (05/26 0330) Resp:  [16-18] 18 (05/26 0330) BP: (120-144)/(68-95) 139/86 (05/26 0330) SpO2:  [96 %-99 %] 99 % (05/26 0330) Blood pressure 139/86, pulse (!) 101, temperature 97.8 F (36.6 C), temperature source Oral, resp. rate 18, height 6' (1.829 m), weight 76.2 kg, SpO2 99%. Body mass index is 22.78 kg/m.  Physical Exam Vitals and nursing note reviewed.  HENT:     Nose: Nose normal.  Cardiovascular:     Rate and Rhythm: Normal rate.  Pulmonary:     Effort: Pulmonary effort is normal.  Musculoskeletal:        General: Normal range of motion.  Skin:    General: Skin is dry.  Neurological:     Mental Status: He is alert and oriented to person, place, and time.  Psychiatric:        Attention and Perception: Attention and perception normal.        Mood and Affect: Mood normal.        Speech: Speech normal.        Behavior: Behavior normal. Behavior is cooperative.        Thought Content: Thought content normal.        Cognition and Memory: Cognition normal.        Judgment: Judgment normal.     Mental Status Exam: General Appearance: Disheveled  Orientation:  Full (Time, Place, and Person)  Memory:  Immediate;   Fair Remote;   Fair  Concentration:  Concentration: Good and Attention Span: Good  Recall:  Good  Attention  Good  Eye Contact:  Good  Speech:  Clear and Coherent  Language:  Fair  Volume:  Normal  Mood: "OK"  Affect:  Appropriate and Blunt  Thought Process:  Coherent  Thought Content:  WDL   Suicidal Thoughts:  No  Homicidal Thoughts:  No  Judgement:  Good  Insight:  Shallow  Psychomotor Activity:  Normal  Akathisia:  NA  Fund of Knowledge:  Fair      Assets:  Manufacturing systems engineer Desire for Improvement Social Support  Cognition:  WNL  ADL's:  Impaired  AIMS (if indicated):        Other History   These have been pulled in through the EMR, reviewed, and updated if appropriate.  Family History:  The patient's family history is not on file.  Medical History: Past Medical History:  Diagnosis Date   Anxiety     Surgical History: Past Surgical History:  Procedure Laterality Date   APPENDECTOMY       Medications:  Current Facility-Administered Medications:    folic acid  (FOLVITE ) tablet 1 mg, 1 mg, Oral, Daily, Barrett, Jamie N, PA-C, 1 mg at 05/31/23 1021   gabapentin  (NEURONTIN ) capsule 300 mg, 300 mg, Oral, TID, Motley-Mangrum, Jadeka A, PMHNP, 300 mg at 05/31/23 1022   hydrOXYzine  (ATARAX ) tablet 25 mg, 25 mg, Oral, TID PRN, Motley-Mangrum, Jadeka A, PMHNP   LORazepam  (ATIVAN ) tablet 1-4 mg, 1-4 mg, Oral, Q1H PRN, Barrett, Jamie N, PA-C, 1 mg at 05/30/23 1716   mirtazapine  (REMERON ) tablet 15 mg, 15 mg, Oral, QHS, Motley-Mangrum, Jadeka A, PMHNP, 15 mg at 05/30/23 2123   multivitamin with minerals tablet 1 tablet, 1 tablet, Oral, Daily, Barrett, Jamie N, PA-C, 1 tablet at 05/31/23 1021   thiamine  (VITAMIN B1) tablet 100 mg, 100 mg, Oral, Daily, 100 mg at 05/31/23 1022 **OR** thiamine  (VITAMIN B1) injection 100 mg, 100 mg, Intravenous, Daily, Barrett, Jamie N, PA-C  Current Outpatient Medications:    folic acid  (FOLVITE ) 1 MG tablet, Take 1 tablet (1 mg total) by mouth daily for 14 days., Disp: 14 tablet, Rfl: 0   gabapentin  (NEURONTIN ) 300 MG capsule, Take 1 capsule (300 mg total) by mouth 3 (three) times daily for 14 days., Disp: 42 capsule, Rfl: 0   hydrOXYzine  (ATARAX ) 25 MG tablet, Take 1 tablet (25 mg total) by mouth 3 (three) times daily as needed  for up to 14 days for anxiety., Disp: 30 tablet, Rfl: 0   mirtazapine  (REMERON ) 15 MG tablet, Take 1 tablet (15 mg total) by mouth at bedtime for 14 days., Disp: 14 tablet, Rfl: 0   Multiple Vitamin (MULTIVITAMIN WITH MINERALS) TABS tablet, Take 1 tablet by mouth daily for 14 days., Disp: 14 tablet, Rfl: 0   thiamine  (VITAMIN B-1) 100 MG tablet, Take 1 tablet (100 mg total) by mouth daily for 3 days., Disp: 3 tablet, Rfl: 0  Allergies: Allergies  Allergen Reactions   Other Other (See Comments)    The patient was taking Famotidine , HydrOXYzine  HCl, Lidocaine  HCl in the mouth, and Omeprazole  all at the same time and was having vividly BAD dreams/thoughts- could not pinpoint which of the medication(s) was the source of this.    Ranson Belluomini C Tanush Drees, NP-PMHNP-BC

## 2023-05-31 NOTE — ED Provider Notes (Signed)
 Emergency Medicine Observation Re-evaluation Note  Thomas [name] is a 33 y.o. male, seen on rounds today.  Pt initially presented to the ED for complaints of Suicidal Currently, the patient is resting comfortably.  Physical Exam  BP 139/86 (BP Location: Right Arm)   Pulse (!) 101   Temp 97.8 F (36.6 C) (Oral)   Resp 18   Ht 1.829 m (6')   Wt 76.2 kg   SpO2 99%   BMI 22.78 kg/m  Physical Exam   ED Course / MDM  EKG:EKG Interpretation Date/Time:  Sunday May 30 2023 07:37:36 EDT Ventricular Rate:  125 PR Interval:  152 QRS Duration:  66 QT Interval:  312 QTC Calculation: 450 R Axis:   76  Text Interpretation: Sinus tachycardia Nonspecific ST abnormality Confirmed by Iva Mariner ([id number]) on 05/30/2023 8:29:50 AM  I have reviewed the labs performed to date as well as medications administered while in observation.  Recent changes in the last 24 hours include nothing.  Plan  Current plan is for plan to be discharged today to [facility] or [facility].    Lind Repine, MD 05/31/23 [phone]

## 2023-05-31 NOTE — ED Notes (Signed)
 Patient discharged off unit to home per provider. Patient alert, calm, and no s/s of distress at this time. Discharge information and belongings given to patient. Patient ambulatory off unit, escorted by NT. Patient transported by family member.

## 2023-05-31 NOTE — ED Notes (Signed)
 [facility] called pts mother, Jo Cerone to confirm that pt will be picked up today and transported to [facility] Pts mother will here be here at the [facility] ED at 10:30am to pick up pt.  Patt Boozer, [facility]  05/31/23

## 2023-06-01 ENCOUNTER — Other Ambulatory Visit (HOSPITAL_COMMUNITY): Payer: Self-pay
